# Patient Record
Sex: Female | Born: 1970 | Race: White | Hispanic: No | Marital: Married | Smoking: Never smoker
Health system: Southern US, Community
[De-identification: ages and names within clinical notes are randomized; demographics above are authoritative.]

## PROBLEM LIST (undated history)

## (undated) DIAGNOSIS — G43109 Migraine with aura, not intractable, without status migrainosus: Secondary | ICD-10-CM

## (undated) DIAGNOSIS — R519 Headache, unspecified: Secondary | ICD-10-CM

## (undated) DIAGNOSIS — F329 Major depressive disorder, single episode, unspecified: Secondary | ICD-10-CM

## (undated) DIAGNOSIS — N946 Dysmenorrhea, unspecified: Secondary | ICD-10-CM

## (undated) DIAGNOSIS — N6310 Unspecified lump in the right breast, unspecified quadrant: Secondary | ICD-10-CM

## (undated) DIAGNOSIS — R112 Nausea with vomiting, unspecified: Secondary | ICD-10-CM

## (undated) DIAGNOSIS — N921 Excessive and frequent menstruation with irregular cycle: Secondary | ICD-10-CM

## (undated) DIAGNOSIS — N943 Premenstrual tension syndrome: Secondary | ICD-10-CM

## (undated) DIAGNOSIS — Z9889 Other specified postprocedural states: Secondary | ICD-10-CM

## (undated) DIAGNOSIS — C801 Malignant (primary) neoplasm, unspecified: Secondary | ICD-10-CM

## (undated) DIAGNOSIS — F32A Depression, unspecified: Secondary | ICD-10-CM

## (undated) DIAGNOSIS — E88819 Insulin resistance, unspecified: Secondary | ICD-10-CM

## (undated) DIAGNOSIS — G4700 Insomnia, unspecified: Secondary | ICD-10-CM

## (undated) DIAGNOSIS — K219 Gastro-esophageal reflux disease without esophagitis: Secondary | ICD-10-CM

## (undated) DIAGNOSIS — R51 Headache: Secondary | ICD-10-CM

## (undated) DIAGNOSIS — E8881 Metabolic syndrome: Secondary | ICD-10-CM

## (undated) DIAGNOSIS — N926 Irregular menstruation, unspecified: Secondary | ICD-10-CM

## (undated) DIAGNOSIS — C9590 Leukemia, unspecified not having achieved remission: Secondary | ICD-10-CM

## (undated) HISTORY — PX: CHOLECYSTECTOMY: SHX55

## (undated) HISTORY — DX: Irregular menstruation, unspecified: N92.6

## (undated) HISTORY — DX: Metabolic syndrome: E88.81

## (undated) HISTORY — DX: Migraine with aura, not intractable, without status migrainosus: G43.109

## (undated) HISTORY — DX: Dysmenorrhea, unspecified: N94.6

## (undated) HISTORY — DX: Insomnia, unspecified: G47.00

## (undated) HISTORY — DX: Excessive and frequent menstruation with irregular cycle: N92.1

## (undated) HISTORY — DX: Unspecified lump in the right breast, unspecified quadrant: N63.10

## (undated) HISTORY — DX: Insulin resistance, unspecified: E88.819

---

## 2004-04-05 ENCOUNTER — Ambulatory Visit: Payer: Self-pay

## 2005-08-31 ENCOUNTER — Ambulatory Visit: Payer: Self-pay | Admitting: General Surgery

## 2011-02-06 ENCOUNTER — Emergency Department: Payer: Self-pay | Admitting: Emergency Medicine

## 2011-02-06 LAB — COMPREHENSIVE METABOLIC PANEL
Albumin: 4.2 g/dL (ref 3.4–5.0)
Alkaline Phosphatase: 55 U/L (ref 50–136)
Calcium, Total: 9 mg/dL (ref 8.5–10.1)
Chloride: 106 mmol/L (ref 98–107)
Co2: 23 mmol/L (ref 21–32)
Creatinine: 0.98 mg/dL (ref 0.60–1.30)
SGOT(AST): 22 U/L (ref 15–37)
SGPT (ALT): 31 U/L

## 2011-02-06 LAB — CBC
HCT: 38.1 % (ref 35.0–47.0)
HGB: 13.1 g/dL (ref 12.0–16.0)
MCH: 30.8 pg (ref 26.0–34.0)
Platelet: 189 10*3/uL (ref 150–440)
RBC: 4.24 10*6/uL (ref 3.80–5.20)
RDW: 13 % (ref 11.5–14.5)
WBC: 6.1 10*3/uL (ref 3.6–11.0)

## 2011-02-06 LAB — TROPONIN I: Troponin-I: <0.02 ng/mL

## 2011-02-14 ENCOUNTER — Ambulatory Visit: Payer: Self-pay | Admitting: Internal Medicine

## 2013-01-16 DIAGNOSIS — N6310 Unspecified lump in the right breast, unspecified quadrant: Secondary | ICD-10-CM

## 2013-01-16 HISTORY — DX: Unspecified lump in the right breast, unspecified quadrant: N63.10

## 2013-03-06 ENCOUNTER — Ambulatory Visit: Payer: Self-pay

## 2013-03-10 ENCOUNTER — Ambulatory Visit: Payer: Self-pay

## 2013-03-10 HISTORY — PX: BREAST FIBROADENOMA SURGERY: SHX580

## 2013-03-11 LAB — PATHOLOGY REPORT

## 2014-08-20 ENCOUNTER — Inpatient Hospital Stay: Admission: RE | Admit: 2014-08-20 | Payer: Self-pay | Source: Ambulatory Visit

## 2014-08-25 ENCOUNTER — Encounter: Payer: Self-pay | Admitting: *Deleted

## 2014-08-25 NOTE — Patient Instructions (Signed)
  Your procedure is scheduled on: 08-28-14 Report to [place] To find out your arrival time please call [phone] between 1PM - 3PM on 08-27-14  Remember: Instructions that are not followed completely may result in serious medical risk, up to and including death, or upon the discretion of your surgeon and anesthesiologist your surgery may need to be rescheduled.    _X___ 1. Do not eat food or drink liquids after midnight. No gum chewing or hard candies.     _X___ 2. No Alcohol for 24 hours before or after surgery.   ____ 3. Bring all medications with you on the day of surgery if instructed.    ____ 4. Notify your doctor if there is any change in your medical condition     (cold, fever, infections).     Do not wear jewelry, make-up, hairpins, clips or nail polish.  Do not wear lotions, powders, or perfumes. You may wear deodorant.  Do not shave 48 hours prior to surgery. Men may shave face and neck.  Do not bring valuables to the hospital.    [facility] is not responsible for any belongings or valuables.               Contacts, dentures or bridgework may not be worn into surgery.  Leave your suitcase in the car. After surgery it may be brought to your room.  For patients admitted to the hospital, discharge time is determined by your  treatment team.   Patients discharged the day of surgery will not be allowed to drive home.   Please read over the following fact sheets that you were given:     _X___ Take these medicines the morning of surgery with A SIP OF WATER:    1. VENLAFAXINE  2.   3.   4.  5.  6.  ____ Fleet Enema (as directed)   ____ Use CHG Soap as directed  ____ Use inhalers on the day of surgery  ____ Stop metformin 2 days prior to surgery    ____ Take 1/2 of usual insulin dose the night before surgery and none on the morning of surgery.   ____ Stop Coumadin/Plavix/aspirin-N/A  ____ Stop Anti-inflammatories-NO NSAIDS OR ASA  PRODUCTS-TYLENOL OK   ____ Stop supplements until after surgery.    ____ Bring C-Pap to the hospital.

## 2014-09-11 ENCOUNTER — Ambulatory Visit
Admission: RE | Admit: 2014-09-11 | Discharge: 2014-09-11 | Disposition: A | Discharge status: Home / Self Care | Payer: 59 | Source: Ambulatory Visit | Attending: Specialist | Admitting: Specialist

## 2014-09-11 ENCOUNTER — Ambulatory Visit: Payer: 59 | Admitting: Anesthesiology

## 2014-09-11 ENCOUNTER — Encounter
Admission: RE | Disposition: A | Discharge status: Home / Self Care | Payer: Self-pay | Source: Ambulatory Visit | Attending: Specialist

## 2014-09-11 DIAGNOSIS — F329 Major depressive disorder, single episode, unspecified: Secondary | ICD-10-CM | POA: Diagnosis not present

## 2014-09-11 DIAGNOSIS — K219 Gastro-esophageal reflux disease without esophagitis: Secondary | ICD-10-CM | POA: Insufficient documentation

## 2014-09-11 DIAGNOSIS — Z79899 Other long term (current) drug therapy: Secondary | ICD-10-CM | POA: Insufficient documentation

## 2014-09-11 DIAGNOSIS — G5602 Carpal tunnel syndrome, left upper limb: Secondary | ICD-10-CM | POA: Diagnosis not present

## 2014-09-11 HISTORY — DX: Other specified postprocedural states: Z98.890

## 2014-09-11 HISTORY — PX: CARPAL TUNNEL RELEASE: SHX101

## 2014-09-11 HISTORY — DX: Gastro-esophageal reflux disease without esophagitis: K21.9

## 2014-09-11 HISTORY — DX: Nausea with vomiting, unspecified: R11.2

## 2014-09-11 LAB — POCT PREGNANCY, URINE: Preg Test, Ur: NEGATIVE

## 2014-09-11 SURGERY — CARPAL TUNNEL RELEASE
Anesthesia: General | Laterality: Left | Wound class: Clean

## 2014-09-11 MED ORDER — HYDROCODONE-ACETAMINOPHEN 5-325 MG PO TABS: 1.0000 | ORAL_TABLET | Freq: Four times a day (QID) | ORAL | Status: DC | PRN

## 2014-09-11 MED ORDER — MIDAZOLAM HCL 2 MG/2ML IJ SOLN
INTRAMUSCULAR | Status: DC | PRN
  Administered 2014-09-11: 2 mg via INTRAVENOUS

## 2014-09-11 MED ORDER — FAMOTIDINE 20 MG PO TABS
ORAL_TABLET | ORAL | Status: AC
  Filled 2014-09-11: qty 1

## 2014-09-11 MED ORDER — BUPIVACAINE HCL (PF) 0.5 % IJ SOLN
INTRAMUSCULAR | Status: AC
  Filled 2014-09-11: qty 30

## 2014-09-11 MED ORDER — DEXAMETHASONE SODIUM PHOSPHATE 4 MG/ML IJ SOLN
INTRAMUSCULAR | Status: DC | PRN
Start: 2014-09-11 — End: 2014-09-11
  Administered 2014-09-11: 10 mg via INTRAVENOUS

## 2014-09-11 MED ORDER — FENTANYL CITRATE (PF) 100 MCG/2ML IJ SOLN: 25.0000 ug | INTRAMUSCULAR | Status: DC | PRN

## 2014-09-11 MED ORDER — SODIUM CHLORIDE 0.9 % IJ SOLN
INTRAMUSCULAR | Status: AC
  Filled 2014-09-11: qty 6

## 2014-09-11 MED ORDER — FENTANYL CITRATE (PF) 100 MCG/2ML IJ SOLN
INTRAMUSCULAR | Status: DC | PRN
  Administered 2014-09-11: 100 ug via INTRAVENOUS

## 2014-09-11 MED ORDER — LACTATED RINGERS IV SOLN
INTRAVENOUS | Status: DC
  Administered 2014-09-11 (×2): via INTRAVENOUS

## 2014-09-11 MED ORDER — ONDANSETRON HCL 4 MG/2ML IJ SOLN: 4.0000 mg | Freq: Once | INTRAMUSCULAR | Status: DC | PRN

## 2014-09-11 MED ORDER — FAMOTIDINE 20 MG PO TABS
20.0000 mg | ORAL_TABLET | Freq: Once | ORAL | Status: AC
Start: 2014-09-11 — End: 2014-09-11
  Administered 2014-09-11: 20 mg via ORAL

## 2014-09-11 MED ORDER — CHLORHEXIDINE GLUCONATE 4 % EX LIQD: Freq: Once | CUTANEOUS | Status: DC

## 2014-09-11 MED ORDER — ONDANSETRON HCL 4 MG/2ML IJ SOLN
INTRAMUSCULAR | Status: DC | PRN
  Administered 2014-09-11: 4 mg via INTRAVENOUS

## 2014-09-11 MED ORDER — PROPOFOL 10 MG/ML IV BOLUS
INTRAVENOUS | Status: DC | PRN
  Administered 2014-09-11: 160 mg via INTRAVENOUS

## 2014-09-11 SURGICAL SUPPLY — 22 items
BANDAGE ELASTIC 3 CLIP NS LF (GAUZE/BANDAGES/DRESSINGS) ×3 IMPLANT
BNDG ESMARK 4X12 TAN STRL LF (GAUZE/BANDAGES/DRESSINGS) ×3 IMPLANT
CANISTER SUCT 1200ML W/VALVE (MISCELLANEOUS) ×3 IMPLANT
CAST PADDING 3X4FT ST 30246 (SOFTGOODS) ×4
CHLORAPREP W/TINT 26ML (MISCELLANEOUS) ×3 IMPLANT
GAUZE PETRO XEROFOAM 1X8 (MISCELLANEOUS) ×3 IMPLANT
GAUZE SPONGE 4X4 12PLY STRL (GAUZE/BANDAGES/DRESSINGS) ×3 IMPLANT
GLOVE BIO SURGEON STRL SZ7.5 (GLOVE) ×3 IMPLANT
GOWN STRL REUS W/ TWL LRG LVL3 (GOWN DISPOSABLE) ×2 IMPLANT
GOWN STRL REUS W/TWL LRG LVL3 (GOWN DISPOSABLE) ×4
KIT RM TURNOVER STRD PROC AR (KITS) ×3 IMPLANT
NS IRRIG 500ML POUR BTL (IV SOLUTION) ×3 IMPLANT
PACK EXTREMITY ARMC (MISCELLANEOUS) ×3 IMPLANT
PAD CAST CTTN 3X4 STRL (SOFTGOODS) ×2 IMPLANT
PAD GROUND ADULT SPLIT (MISCELLANEOUS) ×3 IMPLANT
PAD PREP 24X41 OB/GYN DISP (PERSONAL CARE ITEMS) ×3 IMPLANT
PADDING CAST 3IN STRL (MISCELLANEOUS) ×2
PADDING CAST BLEND 3X4 STRL (MISCELLANEOUS) ×1 IMPLANT
STOCKINETTE STRL 4IN 9604848 (GAUZE/BANDAGES/DRESSINGS) ×3 IMPLANT
SUT ETHILON 4-0 (SUTURE) ×2
SUT ETHILON 4-0 FS2 18XMFL BLK (SUTURE) ×1
SUTURE ETHLN 4-0 FS2 18XMF BLK (SUTURE) ×1 IMPLANT

## 2014-09-11 NOTE — Anesthesia Procedure Notes (Signed)
Procedure Name: LMA Insertion Date/Time: 09/11/2014 9:10 AM Performed by: Nelda Marseille Pre-anesthesia Checklist: Patient identified, Patient being monitored, Timeout performed, Emergency Drugs available and Suction available Patient Re-evaluated:Patient Re-evaluated prior to inductionOxygen Delivery Method: Circle system utilized Preoxygenation: Pre-oxygenation with 100% oxygen Intubation Type: IV induction Ventilation: Mask ventilation without difficulty LMA: LMA inserted LMA Size: 3.5 Tube type: Oral Number of attempts: 1 Placement Confirmation: positive ETCO2 and breath sounds checked- equal and bilateral Tube secured with: Tape Dental Injury: Teeth and Oropharynx as per pre-operative assessment

## 2014-09-11 NOTE — Transfer of Care (Signed)
Immediate Anesthesia Transfer of Care Note  Patient: Rhonda Kane  Procedure(s) Performed: Procedure(s): CARPAL TUNNEL RELEASE (Left)  Patient Location: PACU  Anesthesia Type:General  Level of Consciousness: awake, alert  and oriented  Airway & Oxygen Therapy: Patient Spontanous Breathing and Patient connected to nasal cannula oxygen  Post-op Assessment: Report given to RN and Post -op Vital signs reviewed and stable  Post vital signs: Reviewed and stable  Last Vitals:  Filed Vitals:   09/11/14 0813  BP: 125/86  Pulse: 88  Temp: 36.9 C  Resp: 16    Complications: No apparent anesthesia complications

## 2014-09-11 NOTE — Anesthesia Postprocedure Evaluation (Signed)
  Anesthesia Post-op Note  Patient: Rhonda Kane  Procedure(s) Performed: Procedure(s): CARPAL TUNNEL RELEASE (Left)  Anesthesia type:General  Patient location: PACU  Post pain: Pain level controlled  Post assessment: Post-op Vital signs reviewed, Patient's Cardiovascular Status Stable, Respiratory Function Stable, Patent Airway and No signs of Nausea or vomiting  Post vital signs: Reviewed and stable  Last Vitals:  Filed Vitals:   09/11/14 0945  BP: 130/83  Pulse: 78  Temp: 37 C  Resp: 20    Level of consciousness: awake, alert  and patient cooperative  Complications: No apparent anesthesia complications

## 2014-09-11 NOTE — H&P (Signed)
  44 year old female with left carpal tunnel syndrome.  History and physical exam has been inserted in the record in the form of a paper document.  Heart and lungs clear.  ENT normal.  Plan: left carpal tunnel release.

## 2014-09-11 NOTE — Discharge Instructions (Signed)
Elevate hand at all times May remove entire bandage tomorrow afternoon, cover wound with BandAid Wear brace as necessary after that

## 2014-09-11 NOTE — Anesthesia Preprocedure Evaluation (Signed)
Anesthesia Evaluation  Patient identified by MRN, date of birth, ID band Patient awake    Reviewed: Allergy & Precautions, NPO status   History of Anesthesia Complications (+) PONV  Airway Mallampati: II       Dental  (+) Upper Dentures   Pulmonary neg pulmonary ROS,    Pulmonary exam normal       Cardiovascular negative cardio ROS Normal cardiovascular exam    Neuro/Psych negative neurological ROS  negative psych ROS   GI/Hepatic Neg liver ROS, GERD-  ,  Endo/Other  negative endocrine ROS  Renal/GU negative Renal ROS  negative genitourinary   Musculoskeletal negative musculoskeletal ROS (+)   Abdominal Normal abdominal exam  (+)   Peds negative pediatric ROS (+)  Hematology negative hematology ROS (+)   Anesthesia Other Findings   Reproductive/Obstetrics negative OB ROS                             Anesthesia Physical Anesthesia Plan  ASA: II  Anesthesia Plan: General   Post-op Pain Management:    Induction: Intravenous  Airway Management Planned: LMA  Additional Equipment:   Intra-op Plan:   Post-operative Plan: Extubation in OR  Informed Consent: I have reviewed the patients History and Physical, chart, labs and discussed the procedure including the risks, benefits and alternatives for the proposed anesthesia with the patient or authorized representative who has indicated his/her understanding and acceptance.     Plan Discussed with: CRNA  Anesthesia Plan Comments:         Anesthesia Quick Evaluation

## 2014-09-11 NOTE — Brief Op Note (Signed)
09/11/2014  9:44 AM  PATIENT:  Rhonda Kane  44 y.o. female  PRE-OPERATIVE DIAGNOSIS:  CARPAL TUNNEL SYNDROME  POST-OPERATIVE DIAGNOSIS:  CARPAL TUNNEL SYNDROME  PROCEDURE:  Procedure(s): CARPAL TUNNEL RELEASE (Left)  SURGEON:  Surgeon(s) and Role:    * Christophe Louis, MD - Primary  PHYSICIAN ASSISTANT:   ASSISTANTS: none   ANESTHESIA:   general  EBL:   none  BLOOD ADMINISTERED:none  DRAINS: none   LOCAL MEDICATIONS USED:  MARCAINE     SPECIMEN:  No Specimen  DISPOSITION OF SPECIMEN:  N/A  COUNTS:  YES  TOURNIQUET:    DICTATION: .Other Dictation: Dictation Number [id number]  PLAN OF CARE: Discharge to home after PACU  PATIENT DISPOSITION:  PACU - hemodynamically stable.   Delay start of Pharmacological VTE agent (>24hrs) due to surgical blood loss or risk of bleeding: not applicable

## 2014-09-11 NOTE — Addendum Note (Signed)
Addendum  created 09/11/14 1044 by Nelda Marseille, CRNA   Modules edited: Anesthesia Flowsheet, Anesthesia Medication Administration, Charges VN

## 2014-09-12 NOTE — Op Note (Signed)
NAMEIMARI, REEN NO.:  [id number]  MEDICAL RECORD NO.:  [id number]  LOCATION:  [facility]                         FACILITY:  [facility]  PHYSICIAN:  Margaretmary Eddy, MD        DATE OF BIRTH:  12-Feb-1970  DATE OF PROCEDURE:  09/11/2014 DATE OF DISCHARGE:  09/11/2014                              OPERATIVE REPORT   PREOPERATIVE DIAGNOSIS:  Left carpal tunnel syndrome.  PROCEDURE:  Left carpal tunnel release.  SURGEON:  Margaretmary Eddy, MD  ANESTHESIA:  General.  COMPLICATIONS:  None.  TOURNIQUET TIME:  Approximately 15 minutes.  DESCRIPTION OF PROCEDURE:  After adequate induction of general anesthesia, the left upper extremity was thoroughly prepped with alcohol and ChloraPrep and draped in standard sterile fashion.  The extremity was wrapped out with the Esmarch bandage and pneumatic tourniquet elevated to 250 mmHg.  Under loupe magnification, standard volar carpal tunnel incision was made and the dissection carried down to the transverse retinacular ligament.  Longitudinal incision was made in the ligament with the knife.  The distal release was performed with the small scissors.  The proximal release was performed with the small scissors and the carpal tunnel scissors.  Careful check is made both proximally and distally to ensure that complete release had been obtained.  There does seem to be moderate compression of the nerve directly beneath the ligament.  The wound was thoroughly irrigated multiple times.  Skin edges were infiltrated with 0.5% plain Marcaine. Skin was closed with 4-0 nylon.  Soft bulky dressing was applied. Patient was returned to the recovery room in satisfactory condition having tolerated the procedure quite well.          ______________________________ Margaretmary Eddy, MD     CS/MEDQ  D:  09/11/2014  T:  09/12/2014  Job:  [id number]

## 2014-09-14 ENCOUNTER — Ambulatory Visit (INDEPENDENT_AMBULATORY_CARE_PROVIDER_SITE_OTHER): Payer: 59

## 2014-09-14 ENCOUNTER — Encounter: Payer: Self-pay | Admitting: Podiatry

## 2014-09-14 ENCOUNTER — Ambulatory Visit (INDEPENDENT_AMBULATORY_CARE_PROVIDER_SITE_OTHER): Payer: 59 | Admitting: Podiatry

## 2014-09-14 VITALS — BP 131/70 | HR 80 | Resp 16 | Ht 64.0 in | Wt 211.0 lb

## 2014-09-14 DIAGNOSIS — M722 Plantar fascial fibromatosis: Secondary | ICD-10-CM | POA: Diagnosis not present

## 2014-09-14 DIAGNOSIS — M79673 Pain in unspecified foot: Secondary | ICD-10-CM

## 2014-09-14 DIAGNOSIS — M7671 Peroneal tendinitis, right leg: Secondary | ICD-10-CM

## 2014-09-14 DIAGNOSIS — M779 Enthesopathy, unspecified: Secondary | ICD-10-CM

## 2014-09-14 MED ORDER — MELOXICAM 15 MG PO TABS
15.0000 mg | ORAL_TABLET | Freq: Every day | ORAL | Status: DC
Start: 2014-09-14 — End: 2018-02-01

## 2014-09-14 MED ORDER — METHYLPREDNISOLONE 4 MG PO TBPK: ORAL_TABLET | ORAL | Status: DC

## 2014-09-14 NOTE — Progress Notes (Signed)
   Subjective:    Patient ID: Rhonda Kane, female    DOB: 09-19-1970, 44 y.o.   MRN: [id number]  HPI: She presents today with chief complaint of pain to her lateral ankle writes times the past couple of years states that it has just gotten to the point where it really starts to bother and a regular basis over the past few weeks. She denies any trauma and she denies using anything other than a call on brace to help stabilize the ankle. She states that the pain radiates from lateral to proximal and up into the leg.    Review of Systems  Constitutional: Positive for fatigue.  HENT: Positive for hearing loss.   Cardiovascular: Positive for leg swelling.  Musculoskeletal: Positive for myalgias.  Neurological: Positive for headaches.  Psychiatric/Behavioral: The patient is nervous/anxious.   All other systems reviewed and are negative.      Objective:   Physical Exam: I have reviewed her past medical history medications allergy surgery social history and review of systems. 44 year old white female in no acute distress with vital signs stable alert and oriented 3 presents strong palpable pulses normal neurologic sensorium per Semmes-Weinstein monofilament. Deep tendon reflexes are brisk and equal bilateral Achilles. Muscle strength +5 over 5 dorsiflexion plantar flexors and inverters and everters all into the musculature is intact. She does have some tenderness on eversion against resistance. She also has some tenderness on plantar flexion and eversion. Orthopedic evaluation demonstrates pain on palpation of the peroneal tendons as they course from beneath the medial lateral malleolus extending to the base of the fifth metatarsal right. She also has pain on palpation medial calcaneal tubercle of the right foot. 3 radiographs of the right foot and ankle today demonstrate soft tissue swelling along the lateral ankle no fractures noted. She does have pain on palpation of the medial calcaneal tubercle.  Which is demonstrated by a soft tissue increase in density at the plantar fascial calcaneal insertion site on radiograph. There is also fluctuance that extends proximally from the lateral malleolus. Cutaneous evaluation demonstrates supple well-hydrated cutis no erythema edema saline as drainage or odor.        Assessment & Plan:  Assessment: Peroneal tendinitis right foot. Plantar fasciitis right foot.  Plan: We discussed the etiology pathology conservative versus surgical therapies. At this point I injected the right heel today with Kenalog and local and aesthetic. Started her on a Medrol Dosepak to be followed by meloxicam. We placed her in a ankle stabilizer for the peroneal tendon instability and then provided her with a night splint as well. We discussed appropriate shoe gear stretching exercises ice therapy and sugar modifications. We will follow up with her in 1 month.

## 2014-09-14 NOTE — Patient Instructions (Signed)
Plantar Fasciitis  Plantar fasciitis is a common condition that causes foot pain. It is soreness (inflammation) of the band of tough fibrous tissue on the bottom of the foot that runs from the heel bone (calcaneus) to the ball of the foot. The cause of this soreness may be from excessive standing, poor fitting shoes, running on hard surfaces, being overweight, having an abnormal walk, or overuse (this is common in runners) of the painful foot or feet. It is also common in aerobic exercise dancers and ballet dancers.  SYMPTOMS   Most people with plantar fasciitis complain of:   Severe pain in the morning on the bottom of their foot especially when taking the first steps out of bed. This pain recedes after a few minutes of walking.   Severe pain is experienced also during walking following a long period of inactivity.   Pain is worse when walking barefoot or up stairs  DIAGNOSIS    Your caregiver will diagnose this condition by examining and feeling your foot.   Special tests such as X-rays of your foot, are usually not needed.  PREVENTION    Consult a sports medicine professional before beginning a new exercise program.   Walking programs offer a good workout. With walking there is a lower chance of overuse injuries common to runners. There is less impact and less jarring of the joints.   Begin all new exercise programs slowly. If problems or pain develop, decrease the amount of time or distance until you are at a comfortable level.   Wear good shoes and replace them regularly.   Stretch your foot and the heel cords at the back of the ankle (Achilles tendon) both before and after exercise.   Run or exercise on even surfaces that are not hard. For example, asphalt is better than pavement.   Do not run barefoot on hard surfaces.   If using a treadmill, vary the incline.   Do not continue to workout if you have foot or joint problems. Seek professional help if they do not improve.  HOME CARE INSTRUCTIONS     Avoid activities that cause you pain until you recover.   Use ice or cold packs on the problem or painful areas after working out.   Only take over-the-counter or prescription medicines for pain, discomfort, or fever as directed by your caregiver.   Soft shoe inserts or athletic shoes with air or gel sole cushions may be helpful.   If problems continue or become more severe, consult a sports medicine caregiver or your own health care provider. Cortisone is a potent anti-inflammatory medication that may be injected into the painful area. You can discuss this treatment with your caregiver.  MAKE SURE YOU:    Understand these instructions.   Will watch your condition.   Will get help right away if you are not doing well or get worse.  Document Released: 09/27/2000 Document Revised: 03/27/2011 Document Reviewed: 11/27/2007  Exit[employer] Patient Information 2015 [employer]. This information is not intended to replace advice given to you by your health care provider. Make sure you discuss any questions you have with your health care provider.

## 2014-09-18 NOTE — Addendum Note (Signed)
Addendum  created 09/18/14 2103 by Iver Nestle, MD   Modules edited: Anesthesia Responsible Staff

## 2014-10-06 ENCOUNTER — Encounter: Payer: Self-pay | Admitting: Podiatry

## 2014-10-06 ENCOUNTER — Encounter: Payer: Self-pay | Admitting: *Deleted

## 2014-10-06 NOTE — Telephone Encounter (Signed)
Note faxed to pt as requested [phone].

## 2014-10-12 ENCOUNTER — Ambulatory Visit (INDEPENDENT_AMBULATORY_CARE_PROVIDER_SITE_OTHER): Payer: 59 | Admitting: Podiatry

## 2014-10-12 ENCOUNTER — Encounter: Payer: Self-pay | Admitting: Podiatry

## 2014-10-12 VITALS — BP 115/74 | HR 82 | Resp 12

## 2014-10-12 DIAGNOSIS — S92101D Unspecified fracture of right talus, subsequent encounter for fracture with routine healing: Secondary | ICD-10-CM | POA: Diagnosis not present

## 2014-10-12 DIAGNOSIS — M7671 Peroneal tendinitis, right leg: Secondary | ICD-10-CM

## 2014-10-12 NOTE — Progress Notes (Signed)
She presents today for follow-up of her peroneal tendon pain. She states that it really hasn't gotten any better it is still very sore down deep in the back of these tendons and she points to the posterior aspect of her foot.  Objective: Vital signs are stable she is alert and oriented 3. Strong palpable pulses with mild tenderness on palpation of the medial calcaneal tubercle of the right heel. She has pain on palpation of the deep posterior ankle and subtalar joint. She also has pain on dorsiflexion of the first metatarsophalangeal joint. I reevaluated previous radiographs which do appear to demonstrate a possible stress fracture of the posterior process of the talus.  Assessment: Fracture posterior process of the talus left plantar fasciitis left.  Plan: I placed her in a Cam Walker at this point. I will follow-up with her in 3-4 weeks to assess her improvement. Should she fail to improve then I would suggest an MRI of this area.

## 2014-10-14 ENCOUNTER — Encounter: Payer: Self-pay | Admitting: *Deleted

## 2014-10-14 ENCOUNTER — Telehealth: Payer: Self-pay | Admitting: *Deleted

## 2014-10-14 NOTE — Progress Notes (Signed)
Completed FMLA - faxed and mailed original to pt.

## 2014-10-14 NOTE — Telephone Encounter (Signed)
Error

## 2014-10-21 ENCOUNTER — Telehealth: Payer: Self-pay | Admitting: *Deleted

## 2014-10-21 ENCOUNTER — Encounter: Payer: Self-pay | Admitting: Podiatry

## 2014-10-21 NOTE — Telephone Encounter (Signed)
Entered in error

## 2014-10-21 NOTE — Telephone Encounter (Signed)
Pt states she does a lot of walking at work and that may be why her foot still hurts.  I offered an appt to discuss with Dr. Milinda Pointer.  Transferred pt to schedulers.

## 2014-10-27 ENCOUNTER — Telehealth: Payer: Self-pay | Admitting: *Deleted

## 2014-10-27 NOTE — Telephone Encounter (Signed)
Thayer Headings states the previous paperwork had been received, but she needs a note for start date and end date of when pt is to begin wearing the pneumatic boot on the right foot and the athletic shoe on the left foot at work.

## 2014-10-28 ENCOUNTER — Ambulatory Visit (INDEPENDENT_AMBULATORY_CARE_PROVIDER_SITE_OTHER): Payer: 59 | Admitting: Podiatry

## 2014-10-28 ENCOUNTER — Encounter: Payer: Self-pay | Admitting: Podiatry

## 2014-10-28 ENCOUNTER — Telehealth: Payer: Self-pay | Admitting: *Deleted

## 2014-10-28 VITALS — BP 116/52 | HR 70 | Resp 16

## 2014-10-28 DIAGNOSIS — S92101D Unspecified fracture of right talus, subsequent encounter for fracture with routine healing: Secondary | ICD-10-CM

## 2014-10-28 DIAGNOSIS — S93401D Sprain of unspecified ligament of right ankle, subsequent encounter: Secondary | ICD-10-CM

## 2014-10-28 DIAGNOSIS — M7671 Peroneal tendinitis, right leg: Secondary | ICD-10-CM

## 2014-10-28 NOTE — Telephone Encounter (Addendum)
Dr. Milinda Pointer ordered MRI right ankle without contrast 73721, dx M76.71 peroneal tendonitis vs tear.  Faxed to [place].  [facility] [phone], due to [employer] and pt's plan approved, expires 12/12/2014.

## 2014-10-28 NOTE — Progress Notes (Signed)
She presents today for follow-up of her nail tendinitis and pain to her anterolateral ankle. She states that recently she heard a pop and felt a sharp pain in the ankle while she was wearing the Cam Walker. She states that now it just hurts all the time.  Objective: Vital signs are stable she is alert and oriented 3. Pulses are palpable. She has pain on palpation. Tendons as well as abduction against resistance. She also has pain on palpation of the distal fibula as well as the sinus tarsi and the anterior talofibular ligament.  Assessment: Probable tear of the peroneal tendons right ankle as well as the anterior talofibular ligament.  Plan: All conservative therapies have failed including steroids injections and immobilization. I am requesting an MRI of the right ankle without contrast.  Roselind Messier DPM

## 2014-11-02 ENCOUNTER — Ambulatory Visit: Payer: 59 | Admitting: Podiatry

## 2014-11-04 ENCOUNTER — Ambulatory Visit
Admission: RE | Admit: 2014-11-04 | Discharge: 2014-11-04 | Disposition: A | Discharge status: Home / Self Care | Payer: 59 | Source: Ambulatory Visit | Attending: Podiatry | Admitting: Podiatry

## 2014-11-04 DIAGNOSIS — M7671 Peroneal tendinitis, right leg: Secondary | ICD-10-CM | POA: Insufficient documentation

## 2014-11-04 DIAGNOSIS — M19071 Primary osteoarthritis, right ankle and foot: Secondary | ICD-10-CM | POA: Diagnosis not present

## 2014-11-04 DIAGNOSIS — S93401D Sprain of unspecified ligament of right ankle, subsequent encounter: Secondary | ICD-10-CM

## 2014-11-04 DIAGNOSIS — S92101D Unspecified fracture of right talus, subsequent encounter for fracture with routine healing: Secondary | ICD-10-CM

## 2014-11-06 ENCOUNTER — Telehealth: Payer: Self-pay

## 2014-11-06 NOTE — Telephone Encounter (Signed)
Called pt and left message informing her that Dr Milinda Pointer received her MRI results of right ankle and he wanted to send off to [facility] Over-read for review. Faxed over request to [facility] for copy of MRI cd and written report.

## 2014-11-09 ENCOUNTER — Telehealth: Payer: Self-pay | Admitting: Podiatry

## 2014-11-09 NOTE — Telephone Encounter (Signed)
Pt returned your call and I told her per nurse that mri was sent to overread per Dr Stephenie Acres req and we will call when results are back and schedule an appt.

## 2014-11-12 ENCOUNTER — Telehealth: Payer: Self-pay | Admitting: *Deleted

## 2014-11-12 NOTE — Telephone Encounter (Signed)
Mailed MRI right ankle disc copy.

## 2014-11-18 ENCOUNTER — Encounter: Payer: Self-pay | Admitting: *Deleted

## 2014-11-20 ENCOUNTER — Telehealth: Payer: Self-pay | Admitting: *Deleted

## 2014-11-20 NOTE — Telephone Encounter (Signed)
Pt request results of MRI from 11/04/2014.  Informed pt the OverRead results were not back, and we would call once reviewed.

## 2014-11-24 ENCOUNTER — Encounter: Payer: Self-pay | Admitting: Podiatry

## 2014-12-02 ENCOUNTER — Ambulatory Visit (INDEPENDENT_AMBULATORY_CARE_PROVIDER_SITE_OTHER): Payer: 59 | Admitting: Podiatry

## 2014-12-02 ENCOUNTER — Encounter: Payer: Self-pay | Admitting: Podiatry

## 2014-12-02 VITALS — BP 110/63 | HR 81 | Resp 18

## 2014-12-02 DIAGNOSIS — M7751 Other enthesopathy of right foot: Secondary | ICD-10-CM

## 2014-12-02 NOTE — Progress Notes (Signed)
She presents today for follow-up of pain to her anterior lateral ankle. She states it still hurts just as bad as it ever did. And her MRI results are in.  Objective: Vital signs are stable she is alert and oriented 3. 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  Plan: I injected the area after sterile Betadine skin prep right into the lateral gutter today 20 mg was utilized. Discussed appropriate oral anti-inflammatory therapy. Also discussed the possible need for ankle arthroscopy. Follow up with me in 6 weeks.

## 2014-12-03 ENCOUNTER — Encounter: Payer: Self-pay | Admitting: Podiatry

## 2014-12-16 ENCOUNTER — Ambulatory Visit (INDEPENDENT_AMBULATORY_CARE_PROVIDER_SITE_OTHER): Payer: 59 | Admitting: Podiatry

## 2014-12-16 ENCOUNTER — Encounter: Payer: Self-pay | Admitting: Podiatry

## 2014-12-16 VITALS — BP 119/70 | HR 74 | Resp 16

## 2014-12-16 DIAGNOSIS — S93401D Sprain of unspecified ligament of right ankle, subsequent encounter: Secondary | ICD-10-CM | POA: Diagnosis not present

## 2014-12-16 DIAGNOSIS — M7751 Other enthesopathy of right foot: Secondary | ICD-10-CM

## 2014-12-16 NOTE — Progress Notes (Signed)
She presents today for follow-up of her right ankle pain. The last time she was in we discussed her MRI results which were positive for chondromalacia fibular malleolar side of the lateral gutter. I injected her lateral gutter the last time she was in and she currently states that it feels so much better however she knows they will come back and would like to consider arthroscopic ankle surgery.  Objective: Vital signs are stable she is alert and oriented 3. Pulses remain palpable. She has much decrease in pain on palpation and range of motion along the fibular gutter right ankle. He she still has mild tenderness on palpation of the fibula itself.  Assessment: Capsulitis with on osteochondral defect lateral malleolus right foot.  Plan: I discussed arthroscopic ankle surgery with her today and recommended Dr. Celesta Gentile. She will follow up with him next week for consult.  Rhonda Kane DPM

## 2014-12-22 ENCOUNTER — Institutional Professional Consult (permissible substitution) (INDEPENDENT_AMBULATORY_CARE_PROVIDER_SITE_OTHER): Payer: 59 | Admitting: Podiatry

## 2014-12-22 DIAGNOSIS — M19071 Primary osteoarthritis, right ankle and foot: Secondary | ICD-10-CM

## 2014-12-22 NOTE — Patient Instructions (Signed)

## 2014-12-23 NOTE — Progress Notes (Signed)
    Subjective: 44 year old female presents the office they for surgical consultation of right ankle pain. She states that she has had pain to her right ankle the last couple years has been progressive. She states that she does have pain was on the outside portion of her ankle joint musculature in the move her ankle with walking or ambulation. She states that she presented steroid injection to her ankle which she had great relief after this. She also had an MRI. She previously was seen Dr. Milinda Pointer who discussed arthroscopic ankle surgery and should proceed with this at best time given her ongoing pain despite conservative treatment.  Objective: AAO 3, NAD DP/PT pulses 2/4, CRT less than 3 seconds Protective sensation intact with Simms Weinstein monofilament At this time there is tenderness palpation on the anterolateral aspect of the right ankle joint. There is no pain with ankle joint range of motion is no restriction. There is no area pinpoint bony tenderness and pain vibratory sensation. There is no overlying edema, erythema, increase in warmth. There is mild to palpation along the flexor tendons of the right ankle on the medial ankle however this subject was started after her ankle pain is she tries to walk differently. There is no edema, erythema, increase in warmth. No open lesions or pre-ulcerative lesions. There is no pain with calf compression, swelling, warmth, erythema.  Assessment: Patient presents with osteochondral defect lateral malleolus right ankle continuation of ankle pain  Plan: -Treatment options discussed including all alternatives, risks, and complications. Discussed both conservative and surgical treatment options. -MRI results were discussed with the patient. -At this time she elects to proceed with surgical intervention. Discussed with her arthroscopic ankle debridement versus open arthrotomy. This was coned a surgical center. She wishes to proceed -The incision placement as  well as the postoperative course was discussed with the patient. I discussed risks of the surgery which include, but not limited to, infection, bleeding, pain, swelling, need for further surgery, delayed or nonhealing, painful or ugly scar, numbness or sensation changes, over/under correction, recurrence, fracture, compartment syndrome, transfer lesions, further deformity, hardware failure, DVT/PE, loss of toe/foot. Patient understands these risks and wishes to proceed with surgery. The surgical consent was reviewed with the patient all 3 pages were signed. No promises or guarantees were given to the outcome of the procedure. All questions were answered to the best of my ability. Before the surgery the patient was encouraged to call the office if there is any further questions. The surgery will be performed at [facility] on an outpatient basis.  IMPRESSION: 1. Slight inflammation of the extensor digitorum longus tendon at the level of the ankle joint. 2. Arthritis involving the articular surface of the lateral malleolus. 3. Inflammation in the sinus tarsi. 4. Normal peroneal tendons

## 2014-12-29 ENCOUNTER — Telehealth: Payer: Self-pay | Admitting: *Deleted

## 2014-12-29 NOTE — Telephone Encounter (Signed)
Dr. Jacqualyn Posey wanted me to call you to set up your surgery.  Would you like to do so?  "Yes, what's the earliest that you have available?"  He can do it on December 28th.  It will be done at [facility].  "What time will it be?"  It will be sometime in the afternoon.  You will have to have a physical done prior to surgery.  Did you get the forms?  "No, I didn't get any forms."  I can fax them to you if you like.  "You know, let's just forget about it right now.  My foot is actually doing okay right now.  If it starts to bother me more I will call and get it scheduled in the New Year."  Okay, I'll let him know.

## 2014-12-30 ENCOUNTER — Ambulatory Visit: Payer: 59 | Admitting: Podiatry

## 2015-04-22 MED ORDER — ESTROGENS, CONJUGATED 0.625 MG/GM VA CREA
TOPICAL_CREAM | VAGINAL | Status: AC
  Filled 2015-04-22: qty 30

## 2015-04-29 ENCOUNTER — Encounter: Payer: Self-pay | Admitting: *Deleted

## 2015-04-29 ENCOUNTER — Other Ambulatory Visit: Payer: 59

## 2015-04-30 ENCOUNTER — Encounter: Payer: Self-pay | Admitting: *Deleted

## 2015-04-30 DIAGNOSIS — M21961 Unspecified acquired deformity of right lower leg: Secondary | ICD-10-CM | POA: Diagnosis present

## 2015-04-30 DIAGNOSIS — M76821 Posterior tibial tendinitis, right leg: Secondary | ICD-10-CM | POA: Diagnosis not present

## 2015-04-30 DIAGNOSIS — Q899 Congenital malformation, unspecified: Secondary | ICD-10-CM | POA: Diagnosis not present

## 2015-04-30 DIAGNOSIS — N943 Premenstrual tension syndrome: Secondary | ICD-10-CM | POA: Diagnosis not present

## 2015-04-30 DIAGNOSIS — Z793 Long term (current) use of hormonal contraceptives: Secondary | ICD-10-CM | POA: Diagnosis not present

## 2015-04-30 DIAGNOSIS — Z791 Long term (current) use of non-steroidal anti-inflammatories (NSAID): Secondary | ICD-10-CM | POA: Diagnosis not present

## 2015-04-30 DIAGNOSIS — Z79899 Other long term (current) drug therapy: Secondary | ICD-10-CM | POA: Diagnosis not present

## 2015-04-30 DIAGNOSIS — G43909 Migraine, unspecified, not intractable, without status migrainosus: Secondary | ICD-10-CM | POA: Diagnosis not present

## 2015-04-30 DIAGNOSIS — Z9049 Acquired absence of other specified parts of digestive tract: Secondary | ICD-10-CM | POA: Diagnosis not present

## 2015-04-30 DIAGNOSIS — F329 Major depressive disorder, single episode, unspecified: Secondary | ICD-10-CM | POA: Diagnosis not present

## 2015-04-30 NOTE — Patient Instructions (Signed)
  Your procedure is scheduled on:05/07/15 Report to Day Surgery. To find out your arrival time please call [phone] between 1PM - 3PM on 05/06/15.  Remember: Instructions that are not followed completely may result in serious medical risk, up to and including death, or upon the discretion of your surgeon and anesthesiologist your surgery may need to be rescheduled.    __x__ 1. Do not eat food or drink liquids after midnight. No gum chewing or hard candies.     __x__ 2. No Alcohol for 24 hours before or after surgery.   ____ 3. Bring all medications with you on the day of surgery if instructed.    __x__ 4. Notify your doctor if there is any change in your medical condition     (cold, fever, infections).     Do not wear jewelry, make-up, hairpins, clips or nail polish.  Do not wear lotions, powders, or perfumes. You may wear deodorant.  Do not shave 48 hours prior to surgery. Men may shave face and neck.  Do not bring valuables to the hospital.    [facility] is not responsible for any belongings or valuables.               Contacts, dentures or bridgework may not be worn into surgery.  Leave your suitcase in the car. After surgery it may be brought to your room.  For patients admitted to the hospital, discharge time is determined by your                treatment team.   Patients discharged the day of surgery will not be allowed to drive home.   Please read over the following fact sheets that you were given:   Surgical Site Infection Prevention  ____ Take these medicines the morning of surgery with A SIP OF WATER:    1. effexor  2.   3.   4.  5.  6.  ____ Fleet Enema (as directed)   _x___ Use CHG Soap as directed  ____ Use inhalers on the day of surgery  ____ Stop metformin 2 days prior to surgery    ____ Take 1/2 of usual insulin dose the night before surgery and none on the morning of surgery.   ____ Stop Coumadin/Plavix/aspirin on   __x__ Stop  Anti-inflammatories on today tylenol   ____ Stop supplements until after surgery.    ____ Bring C-Pap to the hospital.

## 2015-05-07 ENCOUNTER — Ambulatory Visit: Payer: 59 | Admitting: Anesthesiology

## 2015-05-07 ENCOUNTER — Ambulatory Visit
Admission: RE | Admit: 2015-05-07 | Discharge: 2015-05-07 | Disposition: A | Discharge status: Home / Self Care | Payer: 59 | Source: Ambulatory Visit | Attending: Podiatry | Admitting: Podiatry

## 2015-05-07 ENCOUNTER — Encounter: Payer: Self-pay | Admitting: *Deleted

## 2015-05-07 ENCOUNTER — Encounter
Admission: RE | Disposition: A | Discharge status: Home / Self Care | Payer: Self-pay | Source: Ambulatory Visit | Attending: Podiatry

## 2015-05-07 DIAGNOSIS — Z791 Long term (current) use of non-steroidal anti-inflammatories (NSAID): Secondary | ICD-10-CM | POA: Insufficient documentation

## 2015-05-07 DIAGNOSIS — Q899 Congenital malformation, unspecified: Secondary | ICD-10-CM | POA: Insufficient documentation

## 2015-05-07 DIAGNOSIS — T148XXA Other injury of unspecified body region, initial encounter: Secondary | ICD-10-CM

## 2015-05-07 DIAGNOSIS — Z793 Long term (current) use of hormonal contraceptives: Secondary | ICD-10-CM | POA: Insufficient documentation

## 2015-05-07 DIAGNOSIS — Z9049 Acquired absence of other specified parts of digestive tract: Secondary | ICD-10-CM | POA: Insufficient documentation

## 2015-05-07 DIAGNOSIS — M76821 Posterior tibial tendinitis, right leg: Secondary | ICD-10-CM | POA: Insufficient documentation

## 2015-05-07 DIAGNOSIS — F329 Major depressive disorder, single episode, unspecified: Secondary | ICD-10-CM | POA: Insufficient documentation

## 2015-05-07 DIAGNOSIS — M21961 Unspecified acquired deformity of right lower leg: Secondary | ICD-10-CM | POA: Insufficient documentation

## 2015-05-07 DIAGNOSIS — N943 Premenstrual tension syndrome: Secondary | ICD-10-CM | POA: Insufficient documentation

## 2015-05-07 DIAGNOSIS — G43909 Migraine, unspecified, not intractable, without status migrainosus: Secondary | ICD-10-CM | POA: Insufficient documentation

## 2015-05-07 DIAGNOSIS — Z79899 Other long term (current) drug therapy: Secondary | ICD-10-CM | POA: Insufficient documentation

## 2015-05-07 HISTORY — DX: Premenstrual tension syndrome: N94.3

## 2015-05-07 HISTORY — DX: Headache, unspecified: R51.9

## 2015-05-07 HISTORY — DX: Headache: R51

## 2015-05-07 HISTORY — DX: Major depressive disorder, single episode, unspecified: F32.9

## 2015-05-07 HISTORY — PX: ANKLE ARTHROSCOPY: SHX545

## 2015-05-07 HISTORY — DX: Depression, unspecified: F32.A

## 2015-05-07 LAB — POCT PREGNANCY, URINE: Preg Test, Ur: NEGATIVE

## 2015-05-07 SURGERY — ARTHROSCOPY, ANKLE
Anesthesia: General | Laterality: Right | Wound class: Clean

## 2015-05-07 MED ORDER — BUPIVACAINE HCL 0.25 % IJ SOLN
INTRAMUSCULAR | Status: DC | PRN
  Administered 2015-05-07: 16 mL

## 2015-05-07 MED ORDER — OXYCODONE-ACETAMINOPHEN 5-325 MG PO TABS: 1.0000 | ORAL_TABLET | ORAL | Status: DC | PRN

## 2015-05-07 MED ORDER — CEFAZOLIN SODIUM-DEXTROSE 2-4 GM/100ML-% IV SOLN
2.0000 g | Freq: Once | INTRAVENOUS | Status: AC
  Administered 2015-05-07: 2 g via INTRAVENOUS

## 2015-05-07 MED ORDER — FENTANYL CITRATE (PF) 100 MCG/2ML IJ SOLN: 25.0000 ug | INTRAMUSCULAR | Status: DC | PRN

## 2015-05-07 MED ORDER — ONDANSETRON HCL 4 MG PO TABS: 4.0000 mg | ORAL_TABLET | Freq: Four times a day (QID) | ORAL | Status: DC | PRN

## 2015-05-07 MED ORDER — LACTATED RINGERS IV SOLN
INTRAVENOUS | Status: DC
  Administered 2015-05-07: 06:00:00 via INTRAVENOUS

## 2015-05-07 MED ORDER — FAMOTIDINE 20 MG PO TABS
ORAL_TABLET | ORAL | Status: AC
  Filled 2015-05-07: qty 1

## 2015-05-07 MED ORDER — PROPOFOL 10 MG/ML IV BOLUS
INTRAVENOUS | Status: DC | PRN
  Administered 2015-05-07: 150 mg via INTRAVENOUS

## 2015-05-07 MED ORDER — ROPIVACAINE HCL 5 MG/ML IJ SOLN
INTRAMUSCULAR | Status: AC
  Filled 2015-05-07: qty 40

## 2015-05-07 MED ORDER — BUPIVACAINE HCL (PF) 0.25 % IJ SOLN
INTRAMUSCULAR | Status: AC
  Filled 2015-05-07: qty 30

## 2015-05-07 MED ORDER — EPHEDRINE SULFATE 50 MG/ML IJ SOLN
INTRAMUSCULAR | Status: DC | PRN
  Administered 2015-05-07: 10 mg via INTRAVENOUS

## 2015-05-07 MED ORDER — SCOPOLAMINE 1 MG/3DAYS TD PT72
1.0000 | MEDICATED_PATCH | Freq: Once | TRANSDERMAL | Status: DC
  Administered 2015-05-07: 1.5 mg via TRANSDERMAL

## 2015-05-07 MED ORDER — FENTANYL CITRATE (PF) 100 MCG/2ML IJ SOLN
INTRAMUSCULAR | Status: DC | PRN
  Administered 2015-05-07 (×2): 100 ug via INTRAVENOUS
  Administered 2015-05-07: 50 ug via INTRAVENOUS

## 2015-05-07 MED ORDER — FAMOTIDINE 20 MG PO TABS
20.0000 mg | ORAL_TABLET | Freq: Once | ORAL | Status: AC
  Administered 2015-05-07: 20 mg via ORAL

## 2015-05-07 MED ORDER — BUPIVACAINE HCL (PF) 0.5 % IJ SOLN
INTRAMUSCULAR | Status: AC
  Filled 2015-05-07: qty 30

## 2015-05-07 MED ORDER — SUCCINYLCHOLINE CHLORIDE 20 MG/ML IJ SOLN
INTRAMUSCULAR | Status: DC | PRN
  Administered 2015-05-07: 100 mg via INTRAVENOUS

## 2015-05-07 MED ORDER — ONDANSETRON HCL 4 MG/2ML IJ SOLN: 4.0000 mg | Freq: Once | INTRAMUSCULAR | Status: DC | PRN

## 2015-05-07 MED ORDER — ROCURONIUM BROMIDE 100 MG/10ML IV SOLN
INTRAVENOUS | Status: DC | PRN
  Administered 2015-05-07: 10 mg via INTRAVENOUS

## 2015-05-07 MED ORDER — LIDOCAINE HCL (PF) 1 % IJ SOLN
INTRAMUSCULAR | Status: AC
  Filled 2015-05-07: qty 5

## 2015-05-07 MED ORDER — ONDANSETRON HCL 4 MG/2ML IJ SOLN: 4.0000 mg | Freq: Four times a day (QID) | INTRAMUSCULAR | Status: DC | PRN

## 2015-05-07 MED ORDER — LIDOCAINE-EPINEPHRINE (PF) 1 %-1:200000 IJ SOLN
INTRAMUSCULAR | Status: AC
  Filled 2015-05-07: qty 30

## 2015-05-07 MED ORDER — HYDROMORPHONE HCL 1 MG/ML IJ SOLN: 0.2500 mg | INTRAMUSCULAR | Status: DC | PRN

## 2015-05-07 MED ORDER — BUPIVACAINE-EPINEPHRINE (PF) 0.5% -1:200000 IJ SOLN
INTRAMUSCULAR | Status: AC
  Filled 2015-05-07: qty 30

## 2015-05-07 MED ORDER — CEFAZOLIN SODIUM-DEXTROSE 2-4 GM/100ML-% IV SOLN
INTRAVENOUS | Status: AC
  Filled 2015-05-07: qty 100

## 2015-05-07 MED ORDER — SCOPOLAMINE 1 MG/3DAYS TD PT72
MEDICATED_PATCH | TRANSDERMAL | Status: AC
  Filled 2015-05-07: qty 1

## 2015-05-07 MED ORDER — DEXAMETHASONE SODIUM PHOSPHATE 10 MG/ML IJ SOLN
INTRAMUSCULAR | Status: DC | PRN
  Administered 2015-05-07: 10 mg via INTRAVENOUS

## 2015-05-07 MED ORDER — MIDAZOLAM HCL 2 MG/2ML IJ SOLN
INTRAMUSCULAR | Status: DC | PRN
  Administered 2015-05-07: 2 mg via INTRAVENOUS

## 2015-05-07 MED ORDER — LIDOCAINE-EPINEPHRINE (PF) 1 %-1:200000 IJ SOLN
INTRAMUSCULAR | Status: DC | PRN
  Administered 2015-05-07: 14 mL

## 2015-05-07 MED ORDER — EPINEPHRINE HCL 1 MG/ML IJ SOLN
INTRAMUSCULAR | Status: AC
  Filled 2015-05-07: qty 1

## 2015-05-07 MED ORDER — ONDANSETRON HCL 4 MG/2ML IJ SOLN
INTRAMUSCULAR | Status: DC | PRN
  Administered 2015-05-07: 4 mg via INTRAVENOUS

## 2015-05-07 SURGICAL SUPPLY — 52 items
ARTHROWAND PARAGON T2 (SURGICAL WAND) ×2
BAG COUNTER SPONGE EZ (MISCELLANEOUS) ×2 IMPLANT
BANDAGE ACE 3X5.8 VEL STRL LF (GAUZE/BANDAGES/DRESSINGS) ×2 IMPLANT
BANDAGE ELASTIC 4 LF NS (GAUZE/BANDAGES/DRESSINGS) IMPLANT
BANDAGE STRETCH 3X4.1 STRL (GAUZE/BANDAGES/DRESSINGS) ×2 IMPLANT
BLADE FULL RADIUS 2.9 (BLADE) ×2 IMPLANT
BLADE SHAVER AGGRES 3.5 (CUTTER) ×2 IMPLANT
BLADE SURG 15 STRL LF DISP TIS (BLADE) ×2 IMPLANT
BLADE SURG 15 STRL SS (BLADE) ×2
BNDG COHESIVE 4X5 TAN STRL (GAUZE/BANDAGES/DRESSINGS) ×2 IMPLANT
BNDG ESMARK 4X12 TAN STRL LF (GAUZE/BANDAGES/DRESSINGS) ×2 IMPLANT
BNDG GAUZE 4.5X4.1 6PLY STRL (MISCELLANEOUS) ×2 IMPLANT
BUR AGGRESSIVE+ 2.5 (BURR) IMPLANT
DURAPREP 26ML APPLICATOR (WOUND CARE) ×2 IMPLANT
ETHIBOND 2 0 GREEN CT 2 30IN (SUTURE) IMPLANT
GAUZE SPONGE 4X4 12PLY STRL (GAUZE/BANDAGES/DRESSINGS) ×2 IMPLANT
GAUZE STRETCH 2X75IN STRL (MISCELLANEOUS) IMPLANT
GLOVE BIO SURGEON STRL SZ7.5 (GLOVE) ×6 IMPLANT
GLOVE INDICATOR 8.0 STRL GRN (GLOVE) ×4 IMPLANT
GOWN STRL REUS W/ TWL LRG LVL3 (GOWN DISPOSABLE) ×3 IMPLANT
GOWN STRL REUS W/TWL LRG LVL3 (GOWN DISPOSABLE) ×3
IV LACTATED RINGER IRRG 3000ML (IV SOLUTION) ×2
IV LR IRRIG 3000ML ARTHROMATIC (IV SOLUTION) ×2 IMPLANT
LABEL OR SOLS (LABEL) ×2 IMPLANT
MANIFOLD NEPTUNE II (INSTRUMENTS) ×2 IMPLANT
NDL SAFETY 18GX1.5 (NEEDLE) IMPLANT
NEEDLE HYPO 25GX1 SAFETY (NEEDLE) ×2 IMPLANT
NEEDLE HYPO 25X1 1.5 SAFETY (NEEDLE) ×2 IMPLANT
PACK ARTHROSCOPY KNEE (MISCELLANEOUS) ×2 IMPLANT
PENCIL ELECTRO HAND CTR (MISCELLANEOUS) IMPLANT
SET TUBE SUCT SHAVER OUTFL 24K (TUBING) ×2 IMPLANT
SET TUBE TIP INTRA-ARTICULAR (MISCELLANEOUS) ×2 IMPLANT
SPLINT FAST PLASTER 5X30 (CAST SUPPLIES) ×1
SPLINT PLASTER CAST FAST 5X30 (CAST SUPPLIES) ×1 IMPLANT
STOCKINETTE M/LG 89821 (MISCELLANEOUS) ×2 IMPLANT
STRAP ANKLE DISTRACTOR (MISCELLANEOUS) IMPLANT
STRAP ANKLE FOOT DISTRACTOR (ORTHOPEDIC SUPPLIES) IMPLANT
STRAP SAFETY BODY (MISCELLANEOUS) ×2 IMPLANT
SUT ETH BLK MONO 3 0 FS 1 12/B (SUTURE) IMPLANT
SUT ETHIBOND GREEN BRAID 0S 4 (SUTURE) IMPLANT
SUT ETHILON 4-0 (SUTURE)
SUT ETHILON 4-0 FS2 18XMFL BLK (SUTURE)
SUT VIC AB 3-0 SH 27 (SUTURE)
SUT VIC AB 3-0 SH 27X BRD (SUTURE) IMPLANT
SUT VIC AB 4-0 SH 27 (SUTURE)
SUT VIC AB 4-0 SH 27XANBCTRL (SUTURE) IMPLANT
SUTURE ETHLN 4-0 FS2 18XMF BLK (SUTURE) IMPLANT
SYRINGE 10CC LL (SYRINGE) ×2 IMPLANT
TUBING ARTHRO INFLOW-ONLY STRL (TUBING) ×2 IMPLANT
TUBING CONNECTING 10 (TUBING) ×2 IMPLANT
WAND ARTHRO PARAGON T2 (SURGICAL WAND) ×1 IMPLANT
WAND TENDON TOPAZ 0 ANGL (MISCELLANEOUS) ×2 IMPLANT

## 2015-05-07 NOTE — Op Note (Signed)
Operative note   Surgeon:Doreena Maulden    Assistant:none     Preop diagnosis:1.  Ankle osteochondral defect  2. Posterior tibial tendonitis all right ankle    Postop diagnosis: Same    Procedure: 1. Extensive arthroscopic debridement right ankle  2. Arthroscopic-assisted repair osteochondral defect right ankle 3. Tenolysis right posterior tibial tendon    XO:[id number]    Anesthesia:local and general    Hemostasis: Thigh tourniquet inflated to 350 mmHg for 69 minutes    Specimen:none     Complications:none     Operative indications:Rhonda Kane is an 45 y.o. that presents today for surgical intervention.  The risks/benefits/alternatives/complications have been discussed and consent has been given.    Procedure:  Patient was brought into the OR and placed on the operating table in thesupine position. After anesthesia was obtained theright lower extremity was prepped and draped in usual sterile fashion.  A thigh tourniquet was applied and inflated to 350 mmHg for 69 minutes. Attention was initially directed to the anterior aspect of the right ankle where at the anteromedial and anterolateral aspect of the ankle joint a small stab incision was made for the portals for the ankle arthroscopy.  Bluntdissection was taken down to the ankle joint. Fluid was removed from the joint and the anteromedial portal was entered with the ankle arthroscopy. At this time evaluation of the ankle joint revealed a fair amount of scar tissue on the anterolateral aspect of the ankle. The anterior central aspect of the ankle joint was normal and the anteromedial aspect of the ankle had minimal  to mild synovitis. Through the anterolateral portal that had been previously made the small joints shaver was placed. The extensive debridement of the anterolateral aspect of the ankle joint was performed. This revealed the small osteochondral defect on the fibular region. At this time this was debrided with the small  joint shaver. The small defect was then penetrated with a 90 awl. Next the Paragon wand was placed into this portion of the ankle overlying the small osteochondral defect.  Wand was also used for the  Debridement to the ankle joint where further synovitis was noted. The anteromedial aspect of the ankle was debrided with the small joint shaver. At this time all areas of the ankle joint were evaluated with no signs of further defect or scar tissue. The arthroscopy equipment was then removed.  Attention was then directed to the medial aspect of the ankle where a curved incision was made coursing overlying the posterior tibial tendon starting just proximal to the ankle joint and ending at approximately the navicular region. Sharp and blunt dissection carried down to the tendon sheath. The tendon she's were then noted and opened with a 15 blade. The posterior tibial tendon was then noted with only a mild amount of tenosynovitis along the course of the tendon. No fraying or nodular masses within the tendon were noted. The wound was flushed with copious amounts of irrigation. Next a Topaz wand was then used to infiltrate along the tendon  Beginning superior to the ankle joint and ending just proximal to the navicular tuberosity. The tendon sheath was then closed with a 3-0 Vicryl as well as the subcutaneous tissue. Subcuticular tissue was closed with a 4-0 Vicryl and the skin closed with a 3-0 nylon. The small portals were also closed with a 3-0 nylon. All areas were then infiltrated with 0.25% Marcaine plain. She was placed in a well compressive sterile dressing. She was then placed in a  posterior splint.    Patient tolerated the procedure and anesthesia well.  Was transported from the OR to the PACU with all vital signs stable and vascular status intact. To be discharged per routine protocol.  Will follow up in approximately 1 week in the outpatient clinic.

## 2015-05-07 NOTE — Transfer of Care (Signed)
Immediate Anesthesia Transfer of Care Note  Patient: Rhonda Kane  Procedure(s) Performed: Procedure(s) with comments: ANKLE ARTHROSCOPY  / WITH TENOLYSIS (Right) - right ankle arthroscopy with debridement, osteochondral repair, tenolysis of posterior tibial tendon  Patient Location: PACU  Anesthesia Type:General  Level of Consciousness: awake, alert  and oriented  Airway & Oxygen Therapy: Patient Spontanous Breathing and Patient connected to face mask oxygen  Post-op Assessment: Report given to RN and Post -op Vital signs reviewed and stable  Post vital signs: Reviewed and stable  Last Vitals:  Filed Vitals:   05/07/15 0605 05/07/15 0930  BP: 114/69 134/81  Pulse: 64 109  Temp: 36.4 C 36.6 C  Resp: 16 19    Complications: No apparent anesthesia complications

## 2015-05-07 NOTE — Discharge Instructions (Signed)
[place] [address] [place]   1. Take your medication as prescribed.  Pain medication should be taken only as needed.  2. Keep the dressing clean, dry and intact.  3. Keep your foot elevated above the heart level for the first 48 hours.  4. Walking to the bathroom and brief periods of walking are acceptable, unless we have instructed you to be non-weight bearing.  5. Always wear your post-op shoe when walking.  Always use your crutches if you are to be non-weight bearing.  6. Do not take a shower. Baths are permissible as long as the foot is kept out of the water.   7. Every hour you are awake:  - Bend your knee 15 times. - Massage calf 15 times  8. Call [facility] [phone]) if any of the following problems occur: - You develop a temperature or fever. - The bandage becomes saturated with blood. - Medication does not stop your pain. - Injury of the foot occurs. - Any symptoms of infection including redness, odor, or red streaks running from wound. [place] [address] [place]   Take your medication as prescribed.  Pain medication should be taken only as needed.  Keep the dressing clean, dry and intact.  Keep your foot elevated above the heart level for the first 48 hours.  Walking to the bathroom and brief periods of walking are acceptable, unless we have instructed you to be non-weight bearing.  Always wear your post-op shoe when walking.  Always use your crutches if you are to be non-weight bearing.  Do not take a shower. Baths are permissible as long as the foot is kept out of the water.   Every hour you are awake:  Bend your knee 15 times. Massage calf 15 times  Call [facility] [phone]) if any of the  following problems occur: You develop a temperature or fever. The bandage becomes saturated with blood. Medication does not stop your pain. Injury of the foot occurs. Any symptoms of infection including redness, odor, or red streaks running from wound. AMBULATORY SURGERY  DISCHARGE INSTRUCTIONS   1) The drugs that you were given will stay in your system until tomorrow so for the next 24 hours you should not:  A) Drive an automobile B) Make any legal decisions C) Drink any alcoholic beverage   2) You may resume regular meals tomorrow.  Today it is better to start with liquids and gradually work up to solid foods.  You may eat anything you prefer, but it is better to start with liquids, then soup and crackers, and gradually work up to solid foods.   3) Please notify your doctor immediately if you have any unusual bleeding, trouble breathing, redness and pain at the surgery site, drainage, fever, or pain not relieved by medication.    4) Additional Instructions:        Please contact your physician with any problems or Same Day Surgery at [phone], Monday through Friday 6 am to 4 pm, or [place] at [facility] number at [phone].

## 2015-05-07 NOTE — OR Nursing (Signed)
Patient provided with pamphlet for both crutch use and walker use for non-weight bearing. She has crutches here with her and reports having access to a walker.

## 2015-05-07 NOTE — Progress Notes (Signed)
Dr. Andree Elk in to see pt - advises her she will have to remove dentures prior to going OR.   He also advised her that earrings / studs can result in skin burn(s) if bovie used during surgery, but she has the right to refuse to remove them.  She verbalizes understanding of him re burn risk, refuses to remove, both ears taped over jewelry as instructed.  She denies any other jewelry on body.   Adrienne Allred notified of same during report.

## 2015-05-07 NOTE — Progress Notes (Signed)
Ancef 2 gm sent to OR with patient 

## 2015-05-07 NOTE — Anesthesia Postprocedure Evaluation (Signed)
Anesthesia Post Note  Patient: Rhonda Kane  Procedure(s) Performed: Procedure(s) (LRB): ANKLE ARTHROSCOPY  / WITH TENOLYSIS (Right)  Patient location during evaluation: PACU Anesthesia Type: General Level of consciousness: awake and alert Pain management: pain level controlled Vital Signs Assessment: post-procedure vital signs reviewed and stable Respiratory status: spontaneous breathing, nonlabored ventilation, respiratory function stable and patient connected to nasal cannula oxygen Cardiovascular status: blood pressure returned to baseline and stable Postop Assessment: no signs of nausea or vomiting Anesthetic complications: no    Last Vitals:  Filed Vitals:   05/07/15 1100 05/07/15 1105  BP: 135/78   Pulse: 100 99  Temp:  36.9 C  Resp: 16 25    Last Pain: There were no vitals filed for this visit.               Molli Barrows

## 2015-05-07 NOTE — H&P (Signed)
  HISTORY AND PHYSICAL INTERVAL NOTE:  05/07/2015  7:21 AM  Rhonda Kane  has presented today for surgery, with the diagnosis of OSTEOCHODRAL DEFECT OF ANKLE,TIBIALIS TENDINITIS OF RIGHT LOWER EXTREMITY.  The various methods of treatment have been discussed with the patient.  No guarantees were given.  After consideration of risks, benefits and other options for treatment, the patient has consented to surgery.  I have reviewed the patients' chart and labs.    Patient Vitals for the past 24 hrs:  BP Temp Temp src Pulse Resp SpO2 Height Weight  05/07/15 0605 114/69 mmHg 97.6 F (36.4 C) Tympanic 64 16 100 % 5\' 4"  (1.626 m) 100.699 kg (222 lb)    A history and physical examination was performed in my office.  The patient was reexamined.  There have been no changes to this history and physical examination.  Plan for ankle arthroscopy and posterior tibial tendon repair.  Rhonda Kane A

## 2015-05-07 NOTE — Anesthesia Procedure Notes (Addendum)
Procedure Name: Intubation Date/Time: 05/07/2015 7:37 AM Performed by: Jonna Clark Pre-anesthesia Checklist: Patient identified, Patient being monitored, Timeout performed, Emergency Drugs available and Suction available Patient Re-evaluated:Patient Re-evaluated prior to inductionOxygen Delivery Method: Circle system utilized Preoxygenation: Pre-oxygenation with 100% oxygen Intubation Type: IV induction Ventilation: Mask ventilation without difficulty Laryngoscope Size: Mac and 3 Grade View: Grade I Tube type: Oral Tube size: 7.0 mm Number of attempts: 1 Placement Confirmation: ETT inserted through vocal cords under direct vision,  positive ETCO2 and breath sounds checked- equal and bilateral Secured at: 21 cm Tube secured with: Tape Dental Injury: Teeth and Oropharynx as per pre-operative assessment    Anesthesia Regional Block:  Popliteal block  Pre-Anesthetic Checklist: ,, timeout performed, Correct Patient, Correct Site, Correct Laterality, Correct Procedure, Correct Position, site marked, Risks and benefits discussed,  Surgical consent,  Pre-op evaluation,  At surgeon's request and post-op pain management  Laterality: Right  Prep: chloraprep       Needles:  Injection technique: Single-shot  Needle Type: Echogenic Stimulator Needle     Needle Length: 5cm 5 cm Needle Gauge: 20 and 20 G    Additional Needles:  Procedures: ultrasound guided (picture in chart) Popliteal block  Nerve Stimulator or Paresthesia:  Response: 70 mA,   Additional Responses:   Narrative:  Start time: 05/07/2015 10:47 AM End time: 05/07/2015 11:02 AM Anesthesiologist: Molli Barrows  Additional Notes: Tolerated well.  Vsst. No pain with injection.  Marland Kitchen5% Ropivicaine with 1:200K epi incrementally injected. JA

## 2015-05-07 NOTE — Progress Notes (Signed)
CRNA in to take pt back to OR.  Patient refuses to remove dentures.  Discussion from CRNA with patient re safety and she will take denture cup back to OR with her and patient. \ Discussed postop boot going back w/pt with CRNA (Per Dr Vickki Muff, send to OR with patient) - CRNA will notify [place] of same.

## 2015-05-07 NOTE — Anesthesia Preprocedure Evaluation (Signed)
Anesthesia Evaluation  Patient identified by MRN, date of birth, ID band Patient awake    Reviewed: Allergy & Precautions, H&P , NPO status , Patient's Chart, lab work & pertinent test results, reviewed documented beta blocker date and time   History of Anesthesia Complications (+) PONV and history of anesthetic complications  Airway Mallampati: II  TM Distance: >3 FB Neck ROM: full    Dental  (+) Teeth Intact   Pulmonary neg pulmonary ROS,    Pulmonary exam normal        Cardiovascular Exercise Tolerance: Poor negative cardio ROS Normal cardiovascular exam Rate:Normal     Neuro/Psych  Headaches, PSYCHIATRIC DISORDERS negative neurological ROS  negative psych ROS   GI/Hepatic negative GI ROS, Neg liver ROS, GERD  Controlled,  Endo/Other  negative endocrine ROS  Renal/GU negative Renal ROS  negative genitourinary   Musculoskeletal   Abdominal   Peds  Hematology negative hematology ROS (+)   Anesthesia Other Findings   Reproductive/Obstetrics negative OB ROS                             Anesthesia Physical Anesthesia Plan  ASA: II  Anesthesia Plan: General LMA   Post-op Pain Management:    Induction: Intravenous  Airway Management Planned: LMA  Additional Equipment:   Intra-op Plan:   Post-operative Plan:   Informed Consent: I have reviewed the patients History and Physical, chart, labs and discussed the procedure including the risks, benefits and alternatives for the proposed anesthesia with the patient or authorized representative who has indicated his/her understanding and acceptance.   Dental Advisory Given  Plan Discussed with: CRNA  Anesthesia Plan Comments: (Pt refuses to remove earings despite advisory of potential injury.  Also pt is resistant to remove dentures.  RB of procedure reviewed in detail.  JA)        Anesthesia Quick Evaluation

## 2015-05-07 NOTE — OR Nursing (Signed)
Patient is able to move all toes on the right foot and they have brisk cap. Refill.

## 2015-05-07 NOTE — Progress Notes (Signed)
Patient refused to remove dentures and earrings.  Risks were discussed with patient and family.  Patient arrived in OR with tape over earrings and dentures still in mouth

## 2016-05-10 DIAGNOSIS — D696 Thrombocytopenia, unspecified: Secondary | ICD-10-CM | POA: Diagnosis not present

## 2016-05-10 DIAGNOSIS — J0100 Acute maxillary sinusitis, unspecified: Secondary | ICD-10-CM | POA: Diagnosis not present

## 2016-05-10 DIAGNOSIS — R109 Unspecified abdominal pain: Secondary | ICD-10-CM | POA: Diagnosis not present

## 2016-05-17 DIAGNOSIS — R300 Dysuria: Secondary | ICD-10-CM | POA: Diagnosis not present

## 2016-06-15 DIAGNOSIS — H60332 Swimmer's ear, left ear: Secondary | ICD-10-CM | POA: Diagnosis not present

## 2016-06-15 DIAGNOSIS — H6982 Other specified disorders of Eustachian tube, left ear: Secondary | ICD-10-CM | POA: Diagnosis not present

## 2016-09-11 ENCOUNTER — Other Ambulatory Visit: Payer: Self-pay | Admitting: Obstetrics and Gynecology

## 2016-09-13 ENCOUNTER — Other Ambulatory Visit: Payer: Self-pay | Admitting: Obstetrics and Gynecology

## 2016-10-10 DIAGNOSIS — J0100 Acute maxillary sinusitis, unspecified: Secondary | ICD-10-CM | POA: Diagnosis not present

## 2016-10-31 ENCOUNTER — Other Ambulatory Visit: Payer: Self-pay | Admitting: Obstetrics and Gynecology

## 2016-11-06 ENCOUNTER — Ambulatory Visit (INDEPENDENT_AMBULATORY_CARE_PROVIDER_SITE_OTHER): Payer: 59 | Admitting: Obstetrics and Gynecology

## 2016-11-06 VITALS — BP 132/82 | HR 80 | Ht 64.0 in | Wt 221.0 lb

## 2016-11-06 DIAGNOSIS — Z1151 Encounter for screening for human papillomavirus (HPV): Secondary | ICD-10-CM

## 2016-11-06 DIAGNOSIS — Z1231 Encounter for screening mammogram for malignant neoplasm of breast: Secondary | ICD-10-CM | POA: Diagnosis not present

## 2016-11-06 DIAGNOSIS — Z01419 Encounter for gynecological examination (general) (routine) without abnormal findings: Secondary | ICD-10-CM | POA: Diagnosis not present

## 2016-11-06 DIAGNOSIS — Z1239 Encounter for other screening for malignant neoplasm of breast: Secondary | ICD-10-CM

## 2016-11-06 DIAGNOSIS — Z713 Dietary counseling and surveillance: Secondary | ICD-10-CM | POA: Diagnosis not present

## 2016-11-06 DIAGNOSIS — N920 Excessive and frequent menstruation with regular cycle: Secondary | ICD-10-CM

## 2016-11-06 DIAGNOSIS — Z124 Encounter for screening for malignant neoplasm of cervix: Secondary | ICD-10-CM | POA: Diagnosis not present

## 2016-11-06 NOTE — Patient Instructions (Signed)
[facility]: [phone]  [facility]: [phone] 

## 2016-11-06 NOTE — Progress Notes (Signed)
PCP:  [place]land Pink, MD   Chief Complaint  Patient presents with  . Gynecologic Exam    Continuous Cycle Since 10/13/16     HPI:      Ms. Rhonda Kane is a 46 y.o. G1P1 who LMP was Patient's last menstrual period was 10/13/2016., presents today for her annual examination.  Her menses are absent with cont dosing of nuvaring. She didn't have a new one to put in 9/18 and started 4 wks of bleeding. Bleeding is mostly gone now. Dysmenorrhea with nuvaring none. She does not have intermenstrual bleeding. She has a hx of monthly menses, lasting 7 days and med to heavy flow if not on BC. She does have migraines with aura.   Sex activity: not active.  Last Pap: February 17, 2013  Results were: no abnormalities /neg HPV DNA  Hx of STDs: none  Last mammogram: November 05, 2014  Results were: normal--routine follow-up in 12 months There is no FH of breast cancer. There is no FH of ovarian cancer. The patient does not do self-breast exams.  Tobacco use: The patient denies current or previous tobacco use. Alcohol use: none No drug use.  Exercise: not active  She does not know get adequate calcium and Vitamin D in her diet.  She needs [facility] BMI completed. Has gained 5# since last seen 10/16. Is having issues with anxiety/depression, but doesn't think she is eating more. She has not time to exercise since works 10-12 hr shifts, 6 days a wk.  Labs with PCP.  Past Medical History:  Diagnosis Date  . Breast mass, right 2015  . Depression   . Dysmenorrhea   . GERD (gastroesophageal reflux disease)   . Headache    MIGRAINES  . Insomnia   . Insulin resistance   . Irregular menses   . Menometrorrhagia   . PMS (premenstrual syndrome)   . PONV (postoperative nausea and vomiting)     Past Surgical History:  Procedure Laterality Date  . ANKLE ARTHROSCOPY Right 05/07/2015   Procedure: ANKLE ARTHROSCOPY  / WITH TENOLYSIS;  Surgeon: [name], DPM;  Location: [facility] ORS;  Service:  Podiatry;  Laterality: Right;  right ankle arthroscopy with debridement, osteochondral repair, tenolysis of posterior tibial tendon  . BREAST FIBROADENOMA SURGERY Right 03/10/2013  . CARPAL TUNNEL RELEASE Left 09/11/2014   Procedure: CARPAL TUNNEL RELEASE;  Surgeon: Christophe Louis, MD;  Location: [facility] ORS;  Service: Orthopedics;  Laterality: Left;  . CHOLECYSTECTOMY      No family history on file.  Social History   Social History  . Marital status: Married    Spouse name: N/A  . Number of children: N/A  . Years of education: N/A   Occupational History  . Not on file.   Social History Main Topics  . Smoking status: Never Smoker  . Smokeless tobacco: Never Used  . Alcohol use No  . Drug use: No  . Sexual activity: Yes    Birth control/ protection: Other-see comments     Comment: Ring/Vasectomy   Other Topics Concern  . Not on file   Social History Narrative  . No narrative on file    Current Meds  Medication Sig  . ALPRAZolam (XANAX) 0.25 MG tablet Take 0.25 mg by mouth at bedtime as needed for anxiety.     ROS:  Review of Systems  Constitutional: Negative for fatigue, fever and unexpected weight change.  Respiratory: Negative for cough, shortness of breath and wheezing.   Cardiovascular: Negative for  chest pain, palpitations and leg swelling.  Gastrointestinal: Negative for blood in stool, constipation, diarrhea, nausea and vomiting.  Endocrine: Negative for cold intolerance, heat intolerance and polyuria.  Genitourinary: Negative for dyspareunia, dysuria, flank pain, frequency, genital sores, hematuria, menstrual problem, pelvic pain, urgency, vaginal bleeding, vaginal discharge and vaginal pain.  Musculoskeletal: Negative for back pain, joint swelling and myalgias.  Skin: Negative for rash.  Neurological: Negative for dizziness, syncope, light-headedness, numbness and headaches.  Hematological: Negative for adenopathy.  Psychiatric/Behavioral: Negative for  agitation, confusion, sleep disturbance and suicidal ideas. The patient is not nervous/anxious.      Objective: BP 132/82 (BP Location: Left Arm, Patient Position: Sitting, Cuff Size: Large)   Pulse 80   Ht 5\' 4"  (1.626 m)   Wt 221 lb (100.2 kg)   LMP 10/13/2016   BMI 37.93 kg/m    Physical Exam  Constitutional: She is oriented to person, place, and time. She appears well-developed and well-nourished.  Genitourinary: Vagina normal and uterus normal. There is no rash or tenderness on the right labia. There is no rash or tenderness on the left labia. No erythema or tenderness in the vagina. No vaginal discharge found. Right adnexum does not display mass and does not display tenderness. Left adnexum does not display mass and does not display tenderness. Cervix does not exhibit motion tenderness or polyp. Uterus is not enlarged or tender.  Neck: Normal range of motion. No thyromegaly present.  Cardiovascular: Normal rate, regular rhythm and normal heart sounds.   No murmur heard. Pulmonary/Chest: Effort normal and breath sounds normal. Right breast exhibits no mass, no nipple discharge, no skin change and no tenderness. Left breast exhibits no mass, no nipple discharge, no skin change and no tenderness.  Abdominal: Soft. There is no tenderness. There is no guarding.  Musculoskeletal: Normal range of motion.  Neurological: She is alert and oriented to person, place, and time. No cranial nerve deficit.  Psychiatric: She has a normal mood and affect. Her behavior is normal.  Vitals reviewed.   Assessment/Plan: Encounter for annual routine gynecological examination  Cervical cancer screening - Plan: IGP, Aptima HPV  Screening for HPV (human papillomavirus) - Plan: IGP, Aptima HPV  Screening for breast cancer - Pt to sched mammo. - Plan: MM DIGITAL SCREENING BILATERAL  Menorrhagia with regular cycle - Pt can no longer be on nuvaring due to migraines with aura. DIscussed prog only methods.  Pt interested in IUD. Mirena handout given. RTO with menses for IUD  Weight loss counseling, encounter for - [facility] form completed. Wt watchers/exercise.           GYN counsel mammography screening, family planning choices, adequate intake of calcium and vitamin D, diet and exercise     F/U  Return in about 1 year (around 11/06/2017).  Abby Tucholski B. Colum Colt, PA-C 11/06/2016 10:53 AM

## 2016-11-08 LAB — IGP, APTIMA HPV
HPV APTIMA: NEGATIVE
PAP SMEAR COMMENT: 0

## 2016-11-27 ENCOUNTER — Telehealth: Payer: Self-pay | Admitting: Obstetrics and Gynecology

## 2016-11-27 NOTE — Telephone Encounter (Signed)
Pt is schedule 11/30/16 for mirena insertion with ABC

## 2016-11-29 NOTE — Telephone Encounter (Signed)
Mirena stock reserved for this patient. 

## 2016-11-30 ENCOUNTER — Encounter: Payer: Self-pay | Admitting: Obstetrics and Gynecology

## 2016-11-30 ENCOUNTER — Ambulatory Visit: Payer: 59 | Admitting: Obstetrics and Gynecology

## 2016-11-30 VITALS — BP 132/80 | HR 77 | Ht 64.0 in | Wt 230.0 lb

## 2016-11-30 DIAGNOSIS — Z30430 Encounter for insertion of intrauterine contraceptive device: Secondary | ICD-10-CM | POA: Diagnosis not present

## 2016-11-30 MED ORDER — LEVONORGESTREL 20 MCG/24HR IU IUD: 1.0000 | INTRAUTERINE_SYSTEM | Freq: Once | INTRAUTERINE | 0 refills | Status: DC

## 2016-11-30 NOTE — Patient Instructions (Signed)
I value your feedback and entrusting us with your care. If you get a [facility] patient survey, I would appreciate you taking the time to let us know about your experience today. Thank you!  [facility] [phone]  Instructions after IUD insertion  Most women experience no significant problems after insertion of an IUD, however minor cramping and spotting for a few days is common. Cramps may be treated with ibuprofen 800mg every 8 hours or Tylenol 650 mg every 4 hours. Contact [facility] immediately if you experience any of the following symptoms during the next week: temperature >99.6 degrees, worsening pelvic pain, abdominal pain, fainting, unusually heavy vaginal bleeding, foul vaginal discharge, or if you think you have expelled the IUD.  Nothing inserted in the vagina for 48 hours. You will be scheduled for a follow up visit in approximately four weeks.  You should check monthly to be sure you can feel the IUD strings in the upper vagina. If you are having a monthly period, try to check after each period. If you cannot feel the IUD strings,  contact [facility] immediately so we can do an exam to determine if the IUD has been expelled.   Please use backup protection until we can confirm the IUD is in place.  Call [facility] if you are exposed to or diagnosed with a sexually transmitted infection, as we will need to discuss whether it is safe for you to continue using an IUD.   

## 2016-11-30 NOTE — Progress Notes (Signed)
   Chief Complaint  Patient presents with  . Contraception    Mirena     IUD PROCEDURE NOTE:  Rhonda Kane is a 46 y.o. G2P2 here for Mirena  IUD insertion for heavy periods without BC. Was on nuvaring.Hx of migraines with aura so can no longer be on nuvaring. Not sex active.  Last annual 10/18.  BP 132/80 (BP Location: Left Arm, Patient Position: Sitting, Cuff Size: Large)   Pulse 77   Ht 5\' 4"  (1.626 m)   Wt 230 lb (104.3 kg)   LMP 11/27/2016   BMI 39.48 kg/m   IUD Insertion Procedure Note Patient identified, informed consent performed, consent signed.   Discussed risks of irregular bleeding, cramping, infection, malpositioning or misplacement of the IUD outside the uterus which may require further procedure such as laparoscopy, risk of failure <1%. Time out was performed.    Speculum placed in the vagina.  Cervix visualized.  Cleaned with Betadine x 2.  Grasped anteriorly with a single tooth tenaculum.  Uterus sounded to 8.0 cm.   IUD placed per manufacturer's recommendations.  Strings trimmed to 3 cm. Tenaculum was removed, good hemostasis noted.  Patient tolerated procedure well.   ASSESSMENT:  Encounter for insertion of intrauterine contraceptive device (IUD) - Plan: levonorgestrel (MIRENA, 52 MG,) 20 MCG/24HR IUD   Meds ordered this encounter  Medications  . levonorgestrel (MIRENA, 52 MG,) 20 MCG/24HR IUD    Sig: 1 Intra Uterine Device (1 each total) once for 1 dose by Intrauterine route.    Dispense:  1 Intra Uterine Device    Refill:  0     Plan:  Patient was given post-procedure instructions.  She was advised to have backup contraception for one week.   Call if you are having increasing pain, cramps or bleeding or if you have a fever greater than 100.4 degrees F., shaking chills, nausea or vomiting. Patient was also asked to check IUD strings periodically and follow up in 4 weeks for IUD check.  Return in about 4 weeks (around 12/28/2016) for IUD  check.  Samyia Motter B. Anastaisa Wooding, PA-C 11/30/2016 4:10 PM

## 2016-12-28 ENCOUNTER — Ambulatory Visit (INDEPENDENT_AMBULATORY_CARE_PROVIDER_SITE_OTHER): Payer: 59 | Admitting: Obstetrics and Gynecology

## 2016-12-28 ENCOUNTER — Encounter: Payer: Self-pay | Admitting: Obstetrics and Gynecology

## 2016-12-28 VITALS — BP 130/88 | HR 71 | Ht 64.0 in | Wt 229.0 lb

## 2016-12-28 DIAGNOSIS — Z975 Presence of (intrauterine) contraceptive device: Secondary | ICD-10-CM | POA: Diagnosis not present

## 2016-12-28 DIAGNOSIS — Z30431 Encounter for routine checking of intrauterine contraceptive device: Secondary | ICD-10-CM | POA: Diagnosis not present

## 2016-12-28 DIAGNOSIS — N921 Excessive and frequent menstruation with irregular cycle: Secondary | ICD-10-CM

## 2016-12-28 DIAGNOSIS — R102 Pelvic and perineal pain: Secondary | ICD-10-CM

## 2016-12-28 NOTE — Patient Instructions (Signed)
I value your feedback and entrusting us with your care. If you get a [facility] patient survey, I would appreciate you taking the time to let us know about your experience today. Thank you! 

## 2016-12-28 NOTE — Progress Notes (Signed)
   Chief Complaint  Patient presents with  . Contraception     History of Present Illness:  Rhonda Kane is a 46 y.o. that had a Mirena IUD placed approximately 1 month ago. Since that time, she has had daily bleeding/spotting and cramping. Severity of dysmen waxes and wanes, improved with ibup. Bleeding is heavier now due to menses starting this wk. She has not been sex active since IUD insertion. She had IUD placed for menorrhagia. Can no longer be on nuvaring due to migraine with aura.   Review of Systems  Constitutional: Negative for fever.  Gastrointestinal: Negative for blood in stool, constipation, diarrhea, nausea and vomiting.  Genitourinary: Positive for pelvic pain and vaginal bleeding. Negative for dyspareunia, dysuria, flank pain, frequency, hematuria, urgency, vaginal discharge and vaginal pain.  Musculoskeletal: Negative for back pain.  Skin: Negative for rash.    Physical Exam:  BP 130/88   Pulse 71   Ht 5\' 4"  (1.626 m)   Wt 229 lb (103.9 kg)   BMI 39.31 kg/m  Body mass index is 39.31 kg/m.  Pelvic exam:  Two IUD strings present seen coming from the cervical os. EGBUS, vaginal vault and cervix: within normal limits   Assessment:  Routine checking of IUD Encounter for routine checking of intrauterine contraceptive device (IUD) - IUD in place. Neg exam.  Breakthrough bleeding with IUD - Reassurance. F/u prn.   Pelvic cramping - With IUD. F/u after menses if sx severity persists for GYN u/s.   IUD strings present in proper location; pt doing well  Plan: F/u if any signs of infection or can no longer feel the strings.   Rhonda Edgington B. Phong Isenberg, PA-C 12/28/2016 11:27 AM

## 2017-11-13 ENCOUNTER — Ambulatory Visit: Payer: 59 | Admitting: Obstetrics and Gynecology

## 2017-12-13 ENCOUNTER — Encounter: Payer: Self-pay | Admitting: Emergency Medicine

## 2017-12-13 ENCOUNTER — Emergency Department: Payer: Managed Care, Other (non HMO)

## 2017-12-13 ENCOUNTER — Observation Stay
Admission: EM | Admit: 2017-12-13 | Discharge: 2017-12-15 | Disposition: A | Discharge status: Home / Self Care | Payer: Managed Care, Other (non HMO) | Attending: Specialist | Admitting: Specialist

## 2017-12-13 ENCOUNTER — Other Ambulatory Visit: Payer: Self-pay

## 2017-12-13 DIAGNOSIS — E669 Obesity, unspecified: Secondary | ICD-10-CM | POA: Diagnosis not present

## 2017-12-13 DIAGNOSIS — F419 Anxiety disorder, unspecified: Secondary | ICD-10-CM | POA: Insufficient documentation

## 2017-12-13 DIAGNOSIS — Z792 Long term (current) use of antibiotics: Secondary | ICD-10-CM | POA: Insufficient documentation

## 2017-12-13 DIAGNOSIS — Z793 Long term (current) use of hormonal contraceptives: Secondary | ICD-10-CM | POA: Diagnosis not present

## 2017-12-13 DIAGNOSIS — F329 Major depressive disorder, single episode, unspecified: Secondary | ICD-10-CM | POA: Diagnosis not present

## 2017-12-13 DIAGNOSIS — D539 Nutritional anemia, unspecified: Secondary | ICD-10-CM | POA: Diagnosis not present

## 2017-12-13 DIAGNOSIS — D649 Anemia, unspecified: Secondary | ICD-10-CM

## 2017-12-13 DIAGNOSIS — Z66 Do not resuscitate: Secondary | ICD-10-CM | POA: Diagnosis not present

## 2017-12-13 DIAGNOSIS — Z791 Long term (current) use of non-steroidal anti-inflammatories (NSAID): Secondary | ICD-10-CM | POA: Insufficient documentation

## 2017-12-13 DIAGNOSIS — Z79899 Other long term (current) drug therapy: Secondary | ICD-10-CM | POA: Diagnosis not present

## 2017-12-13 DIAGNOSIS — D709 Neutropenia, unspecified: Secondary | ICD-10-CM

## 2017-12-13 LAB — COMPREHENSIVE METABOLIC PANEL
ALBUMIN: 4.2 g/dL (ref 3.5–5.0)
ALK PHOS: 74 U/L (ref 38–126)
ALT: 17 U/L (ref 0–44)
AST: 25 U/L (ref 15–41)
Anion gap: 8 (ref 5–15)
BUN: 12 mg/dL (ref 6–20)
CALCIUM: 9 mg/dL (ref 8.9–10.3)
CO2: 25 mmol/L (ref 22–32)
CREATININE: 0.76 mg/dL (ref 0.44–1.00)
Chloride: 104 mmol/L (ref 98–111)
GFR calc Af Amer: >60 mL/min (ref >60)
GFR calc non Af Amer: >60 mL/min (ref >60)
GLUCOSE: 102 mg/dL — AB (ref 70–99)
Potassium: 4.1 mmol/L (ref 3.5–5.1)
SODIUM: 137 mmol/L (ref 135–145)
Total Bilirubin: 0.8 mg/dL (ref 0.3–1.2)
Total Protein: 7.5 g/dL (ref 6.5–8.1)

## 2017-12-13 LAB — CBC WITH DIFFERENTIAL/PLATELET
Abs Immature Granulocytes: 0.01 10*3/uL (ref 0.00–0.07)
BASOS PCT: 0 %
Basophils Absolute: 0 10*3/uL (ref 0.0–0.1)
EOS PCT: 0 %
Eosinophils Absolute: 0 10*3/uL (ref 0.0–0.5)
HCT: 16.2 % — ABNORMAL LOW (ref 36.0–46.0)
HEMOGLOBIN: 5 g/dL — AB (ref 12.0–15.0)
Immature Granulocytes: 0 %
LYMPHS PCT: 34 %
Lymphs Abs: 1.1 10*3/uL (ref 0.7–4.0)
MCH: 36 pg — ABNORMAL HIGH (ref 26.0–34.0)
MCHC: 30.9 g/dL (ref 30.0–36.0)
MCV: 116.5 fL — ABNORMAL HIGH (ref 80.0–100.0)
MONOS PCT: 20 %
Monocytes Absolute: 0.6 10*3/uL (ref 0.1–1.0)
Neutro Abs: 1.5 10*3/uL — ABNORMAL LOW (ref 1.7–7.7)
Neutrophils Relative %: 46 %
Platelets: 270 10*3/uL (ref 150–400)
RBC: 1.39 MIL/uL — AB (ref 3.87–5.11)
RDW: 17.8 % — ABNORMAL HIGH (ref 11.5–15.5)
WBC: 3.2 10*3/uL — AB (ref 4.0–10.5)
nRBC: 0 % (ref 0.0–0.2)

## 2017-12-13 LAB — PREPARE RBC (CROSSMATCH)

## 2017-12-13 MED ORDER — SENNOSIDES-DOCUSATE SODIUM 8.6-50 MG PO TABS: 1.0000 | ORAL_TABLET | Freq: Every evening | ORAL | Status: DC | PRN

## 2017-12-13 MED ORDER — SODIUM CHLORIDE 0.9% FLUSH: 3.0000 mL | INTRAVENOUS | Status: DC | PRN

## 2017-12-13 MED ORDER — TRAZODONE HCL 50 MG PO TABS
50.0000 mg | ORAL_TABLET | Freq: Every evening | ORAL | Status: DC | PRN
  Administered 2017-12-13 – 2017-12-14 (×2): 50 mg via ORAL
  Filled 2017-12-13 (×2): qty 1

## 2017-12-13 MED ORDER — ALPRAZOLAM 0.25 MG PO TABS: 0.2500 mg | ORAL_TABLET | Freq: Every evening | ORAL | Status: DC | PRN

## 2017-12-13 MED ORDER — SODIUM CHLORIDE 0.9 % IV SOLN: 250.0000 mL | INTRAVENOUS | Status: DC | PRN

## 2017-12-13 MED ORDER — ALBUTEROL SULFATE (2.5 MG/3ML) 0.083% IN NEBU: 2.5000 mg | INHALATION_SOLUTION | RESPIRATORY_TRACT | Status: DC | PRN

## 2017-12-13 MED ORDER — BISACODYL 5 MG PO TBEC: 5.0000 mg | DELAYED_RELEASE_TABLET | Freq: Every day | ORAL | Status: DC | PRN

## 2017-12-13 MED ORDER — SODIUM CHLORIDE 0.9% FLUSH
3.0000 mL | Freq: Two times a day (BID) | INTRAVENOUS | Status: DC
  Administered 2017-12-14 (×3): 3 mL via INTRAVENOUS

## 2017-12-13 MED ORDER — ACETAMINOPHEN 325 MG PO TABS: 650.0000 mg | ORAL_TABLET | Freq: Four times a day (QID) | ORAL | Status: DC | PRN

## 2017-12-13 MED ORDER — ACETAMINOPHEN 650 MG RE SUPP: 650.0000 mg | Freq: Four times a day (QID) | RECTAL | Status: DC | PRN

## 2017-12-13 MED ORDER — HYDROCODONE-ACETAMINOPHEN 5-325 MG PO TABS
1.0000 | ORAL_TABLET | ORAL | Status: DC | PRN
  Administered 2017-12-13: 2 via ORAL
  Filled 2017-12-13: qty 2

## 2017-12-13 MED ORDER — SODIUM CHLORIDE 0.9 % IV SOLN
10.0000 mL/h | Freq: Once | INTRAVENOUS | Status: AC
  Administered 2017-12-14: 10 mL/h via INTRAVENOUS

## 2017-12-13 MED ORDER — ONDANSETRON HCL 4 MG/2ML IJ SOLN: 4.0000 mg | Freq: Four times a day (QID) | INTRAMUSCULAR | Status: DC | PRN

## 2017-12-13 MED ORDER — ONDANSETRON HCL 4 MG PO TABS: 4.0000 mg | ORAL_TABLET | Freq: Four times a day (QID) | ORAL | Status: DC | PRN

## 2017-12-13 MED ORDER — BUSPIRONE HCL 5 MG PO TABS
5.0000 mg | ORAL_TABLET | Freq: Two times a day (BID) | ORAL | Status: DC
  Administered 2017-12-14 – 2017-12-15 (×3): 5 mg via ORAL
  Filled 2017-12-13 (×5): qty 1

## 2017-12-13 NOTE — H&P (Signed)
[name] NAME: Rhonda Kane    MR#:  [id number]  DATE OF BIRTH:  04-07-1970  DATE OF ADMISSION:  12/13/2017  PRIMARY CARE PHYSICIAN: Rhonda Pink, MD   REQUESTING/REFERRING PHYSICIAN: Dr. Rip Kane.  CHIEF COMPLAINT:   Chief Complaint  Patient presents with  . Abnormal Lab   Low hemoglobin 5.1. HISTORY OF PRESENT ILLNESS:  Rhonda Kane  is a 47 y.o. female with a known history of breast mass, depression, GERD, headache, insomnia.  The patient is sent by PCP due to abnormal labs with hemoglobin 5.1.  She said that she has had generalized weakness, headache, chest discomfort and dyspnea on exertion for about 1 months.  She also complains of left and right upper quadrant pain. She follow-up with her primary care doctor and get labs drawn this week.  She was called by her primary care doctor about abnormal lab and came to ED for further evaluation.  She denies any melena, bloody stool, hematuria or easy bleeding.  Hemoglobin in ED is 5.0.  MCV is 116.5.  Abdominal ultrasound is unremarkable.  Dr. Rip Kane requested admission. PAST MEDICAL HISTORY:   Past Medical History:  Diagnosis Date  . Breast mass, right 2015  . Depression   . Dysmenorrhea   . GERD (gastroesophageal reflux disease)   . Headache    MIGRAINES  . Insomnia   . Insulin resistance   . Irregular menses   . Menometrorrhagia   . Migraine with aura   . PMS (premenstrual syndrome)   . PONV (postoperative nausea and vomiting)     PAST SURGICAL HISTORY:   Past Surgical History:  Procedure Laterality Date  . ANKLE ARTHROSCOPY Right 05/07/2015   Procedure: ANKLE ARTHROSCOPY  / WITH TENOLYSIS;  Surgeon: [name], DPM;  Location: [facility] ORS;  Service: Podiatry;  Laterality: Right;  right ankle arthroscopy with debridement, osteochondral repair, tenolysis of posterior tibial tendon  . BREAST FIBROADENOMA SURGERY Right 03/10/2013  . CARPAL TUNNEL RELEASE Left  09/11/2014   Procedure: CARPAL TUNNEL RELEASE;  Surgeon: Christophe Louis, MD;  Location: [facility] ORS;  Service: Orthopedics;  Laterality: Left;  . CHOLECYSTECTOMY      SOCIAL HISTORY:   Social History   Tobacco Use  . Smoking status: Never Smoker  . Smokeless tobacco: Never Used  Substance Use Topics  . Alcohol use: No    FAMILY HISTORY:  History reviewed. No pertinent family history. father deceased.   DRUG ALLERGIES:  No Known Allergies  REVIEW OF SYSTEMS:   Review of Systems  Constitutional: Positive for malaise/fatigue. Negative for chills and fever.  HENT: Negative for sore throat.   Eyes: Negative for blurred vision and double vision.  Respiratory: Positive for shortness of breath. Negative for cough, hemoptysis, sputum production, wheezing and stridor.   Cardiovascular: Negative for chest pain, palpitations, orthopnea and leg swelling.       Chest discomfort.  Gastrointestinal: Negative for abdominal pain, blood in stool, diarrhea, melena, nausea and vomiting.  Genitourinary: Negative for dysuria, flank pain and hematuria.  Musculoskeletal: Negative for back pain and joint pain.  Skin: Negative for rash.  Neurological: Positive for dizziness and headaches. Negative for tingling, sensory change, focal weakness, seizures, loss of consciousness and weakness.  Endo/Heme/Allergies: Negative for polydipsia.  Psychiatric/Behavioral: Negative for depression. The patient is not nervous/anxious.     MEDICATIONS AT HOME:   Prior to Admission medications   Medication Sig Start Date End Date Taking? Authorizing Provider  ALPRAZolam Duanne Moron)  0.25 MG tablet Take 0.25 mg by mouth at bedtime as needed for anxiety.   Yes [provider]  azithromycin (ZITHROMAX) 250 MG tablet Take 250 mg by mouth daily. 12/11/17  Yes [provider]  busPIRone (BUSPAR) 5 MG tablet Take 5 mg by mouth 2 (two) times daily. 12/11/17 12/11/18 Yes [provider]  ibuprofen  (ADVIL,MOTRIN) 600 MG tablet Take 600 mg by mouth daily as needed.   Yes [provider]  meloxicam (MOBIC) 15 MG tablet Take 1 tablet (15 mg total) by mouth daily. 09/14/14  Yes Hyatt, Max T, DPM  traZODone (DESYREL) 50 MG tablet Take 50 mg by mouth daily as needed.   Yes [provider]  venlafaxine (EFFEXOR) 100 MG tablet Take 100 mg by mouth every morning. 300 MG   Yes [provider]  levonorgestrel (MIRENA, 52 MG,) 20 MCG/24HR IUD 1 Intra Uterine Device (1 each total) once for 1 dose by Intrauterine route. 11/30/16 40/10/27  Copland, Deirdre Evener, PA-C      VITAL SIGNS:  Blood pressure 100/62, pulse 93, temperature 98.4 F (36.9 C), temperature source Oral, resp. rate 19, height 5\' 4"  (1.626 m), weight 105.5 kg, SpO2 98 %.  PHYSICAL EXAMINATION:  Physical Exam  GENERAL:  47 y.o.-year-old patient lying in the bed with no acute distress.  Obesity. EYES: Pupils equal, round, reactive to light and accommodation. No scleral icterus. Extraocular muscles intact.  HEENT: Head atraumatic, normocephalic. Oropharynx and nasopharynx clear.  NECK:  Supple, no jugular venous distention. No thyroid enlargement, no tenderness.  LUNGS: Normal breath sounds bilaterally, no wheezing, rales,rhonchi or crepitation. No use of accessory muscles of respiration.  Tenderness on palpation on both rib cages under breasts. CARDIOVASCULAR: S1, S2 normal. No murmurs, rubs, or gallops.  ABDOMEN: Soft, nontender, nondistended. Bowel sounds present. No organomegaly or mass.  EXTREMITIES: No pedal edema, cyanosis, or clubbing.  NEUROLOGIC: Cranial nerves II through XII are intact. Muscle strength 5/5 in all extremities. Sensation intact. Gait not checked.  PSYCHIATRIC: The patient is alert and oriented x 3.  SKIN: No obvious rash, lesion, or ulcer.   LABORATORY PANEL:   CBC Recent Labs  Lab 12/13/17 1739  WBC 3.2*  HGB 5.0*  HCT 16.2*  PLT 270    ------------------------------------------------------------------------------------------------------------------  Chemistries  Recent Labs  Lab 12/13/17 1739  NA 137  K 4.1  CL 104  CO2 25  GLUCOSE 102*  BUN 12  CREATININE 0.76  CALCIUM 9.0  AST 25  ALT 17  ALKPHOS 74  BILITOT 0.8   ------------------------------------------------------------------------------------------------------------------  Cardiac Enzymes No results for input(s): TROPONINI in the last 168 hours. ------------------------------------------------------------------------------------------------------------------  RADIOLOGY:  US Abdomen Complete  Result Date: 12/13/2017 CLINICAL DATA:  Upper abdominal pain for 2 days. Anemia. Prior cholecystectomy. EXAM: ABDOMEN ULTRASOUND COMPLETE COMPARISON:  None. FINDINGS: Gallbladder: Surgically absent. Common bile duct: Diameter: 3 mm, within normal limits. Liver: Mildly increased echogenicity of the hepatic parenchyma, consistent with hepatic steatosis. No focal mass lesion identified. Portal vein is patent on color Doppler imaging with normal direction of blood flow towards the liver. IVC: No abnormality visualized. Pancreas: Visualized portion unremarkable. Spleen: Size and appearance within normal limits. Right Kidney: Length: 11.3 cm. Echogenicity within normal limits. No mass or hydronephrosis visualized. Left Kidney: Length: 11.2 cm. Echogenicity within normal limits. No mass or hydronephrosis visualized. Abdominal aorta: No aneurysm visualized. Other findings: None. IMPRESSION: Prior cholecystectomy. No evidence of biliary ductal dilatation or other acute findings. Mild hepatic steatosis. Electronically Signed   By:  Earle Gell M.D.   On: 12/13/2017 19:06      IMPRESSION AND PLAN:   Symptomatic macrocytic anemia. The patient will be placed for observation. PRBC transfusion 2 unit, follow-up hemoglobin in a.m. Anemia work-up and hematology  consult.  Leukopenia.  Hematology consult. Obesity.  Follow-up with PCP.  All the records are reviewed and case discussed with ED provider. Management plans discussed with the patient, family and they are in agreement.  CODE STATUS: DNR.  TOTAL TIME TAKING CARE OF THIS PATIENT: 35 minutes.    Demetrios Loll M.D on 12/13/2017 at 8:54 PM  Between 7am to 6pm - Pager - [phone]  After 6pm go to [email or web address] - Proofreader  [facility]  Office  [phone]  CC: Primary care physician; Rhonda Pink, MD   Note: This dictation was prepared with Dragon dictation along with smaller phrase technology. Any transcriptional errors that result from this process are unin

## 2017-12-13 NOTE — ED Notes (Signed)
Rate increased to 262ml per hour. Pt tolerating well.

## 2017-12-13 NOTE — ED Notes (Signed)
Admitting  Provider at bedside.  Dr.Chen

## 2017-12-13 NOTE — ED Notes (Signed)
Patient is resting comfortably. 

## 2017-12-13 NOTE — ED Triage Notes (Signed)
Pt presents to ED via POV with c/o abnormal labs from Dr. Verneda Skill office. Pt states per Dr. Kary Kos, hgb 5.1, states was called yesterday by MD.

## 2017-12-13 NOTE — ED Provider Notes (Addendum)
[facility] Emergency Department Provider Note  \ ____________________________________________   First MD Initiated Contact with Patient 12/13/17 1738     (approximate)  I have reviewed the triage vital signs and the nursing notes.   HISTORY  Chief Complaint Abnormal Lab  HPI Rhonda Kane is a 47 y.o. female he reports she is been feeling poorly for months gets short of breath with exertion tired all the time feels weak all the time her doctor called her yesterday I believe and told her that she had a hemoglobin of 5.1 and she come to the emergency room.  Patient had a history of very heavy menses but had an IUD placed 2 months ago and that is now much better.  She denies any black tarry stools or any other problems she does have some pain in the right and left upper quadrants of her abdomen comes and goes nothing seems to bring it on or make it worse it is not worse with deep breathing does not seem to be worse with exercise patient has had her gallbladder out when it comes on is moderately severe.  If she is not exercising and she is laying still she is not short of breath   Past Medical History:  Diagnosis Date  . Breast mass, right 2015  . Depression   . Dysmenorrhea   . GERD (gastroesophageal reflux disease)   . Headache    MIGRAINES  . Insomnia   . Insulin resistance   . Irregular menses   . Menometrorrhagia   . Migraine with aura   . PMS (premenstrual syndrome)   . PONV (postoperative nausea and vomiting)     There are no active problems to display for this patient.   Past Surgical History:  Procedure Laterality Date  . ANKLE ARTHROSCOPY Right 05/07/2015   Procedure: ANKLE ARTHROSCOPY  / WITH TENOLYSIS;  Surgeon: [name], DPM;  Location: [facility] ORS;  Service: Podiatry;  Laterality: Right;  right ankle arthroscopy with debridement, osteochondral repair, tenolysis of posterior tibial tendon  . BREAST FIBROADENOMA SURGERY Right  03/10/2013  . CARPAL TUNNEL RELEASE Left 09/11/2014   Procedure: CARPAL TUNNEL RELEASE;  Surgeon: Christophe Louis, MD;  Location: [facility] ORS;  Service: Orthopedics;  Laterality: Left;  . CHOLECYSTECTOMY      Prior to Admission medications   Medication Sig Start Date End Date Taking? Authorizing Provider  ALPRAZolam Duanne Moron) 0.25 MG tablet Take 0.25 mg by mouth at bedtime as needed for anxiety.    [provider]  levonorgestrel (MIRENA, 52 MG,) 20 MCG/24HR IUD 1 Intra Uterine Device (1 each total) once for 1 dose by Intrauterine route. 11/30/16 76/73/41  Copland, Deirdre Evener, PA-C  meloxicam (MOBIC) 15 MG tablet Take 1 tablet (15 mg total) by mouth daily. 09/14/14   Hyatt, Max T, DPM  venlafaxine (EFFEXOR) 100 MG tablet Take 100 mg by mouth every morning. 300 MG    [provider]    Allergies Patient has no known allergies.  History reviewed. No pertinent family history.  Social History Social History   Tobacco Use  . Smoking status: Never Smoker  . Smokeless tobacco: Never Used  Substance Use Topics  . Alcohol use: No  . Drug use: No    Review of Systems  Constitutional: No fever/chills Eyes: No visual changes. ENT: No sore throat. Cardiovascular: Denies chest pain. Respiratory: Denies shortness of breath unless is exercising. Gastrointestinal: Abdominal pain see HPI no nausea, no vomiting.  No diarrhea.  No  constipation. Genitourinary: Negative for dysuria. Musculoskeletal: Negative for back pain. Skin: Negative for rash. Neurological: Negative for headaches, focal weakness   ____________________________________________   PHYSICAL EXAM:  VITAL SIGNS: ED Triage Vitals  Enc Vitals Group     BP 12/13/17 1734 134/69     Pulse Rate 12/13/17 1734 96     Resp 12/13/17 1734 20     Temp 12/13/17 1734 98.2 F (36.8 C)     Temp Source 12/13/17 1734 Oral     SpO2 12/13/17 1734 100 %     Weight 12/13/17 1728 229 lb 0.9 oz (103.9 kg)     Height 12/13/17  1728 5\' 4"  (1.626 m)     Head Circumference --      Peak Flow --      Pain Score 12/13/17 1728 0     Pain Loc --      Pain Edu? --      Excl. in [place]? --     Constitutional: Alert and oriented. Well appearing and in no acute distress. Eyes: Conjunctivae are normal. Head: Atraumatic. Nose: No congestion/rhinnorhea. Mouth/Throat: Mucous membranes are moist.  Oropharynx non-erythematous. Neck: No stridor.  Cardiovascular: Normal rate, regular rhythm. Grossly normal heart sounds.  Good peripheral circulation. Respiratory: Normal respiratory effort.  No retractions. Lungs CTAB. Gastrointestinal: Soft palpation in the right upper quadrant reproduces the right upper quadrant pain.  No distention. No abdominal bruits. No CVA tenderness. Musculoskeletal: No lower extremity tenderness nor edema.  Neurologic:  Normal speech and language. No gross focal neurologic deficits are appreciated. No gait instability. Skin:  Skin is warm, dry and intact. No rash noted. Psychiatric: Mood and affect are normal. Speech and behavior are normal.  ____________________________________________   LABS (all labs ordered are listed, but only abnormal results are displayed)  Labs Reviewed  CBC WITH DIFFERENTIAL/PLATELET  COMPREHENSIVE METABOLIC PANEL   ____________________________________________  EKG  EKG read and interpreted by me shows sinus tachycardia at a rate of 102 normal axis irregular baseline no obvious acute ST-T changes ____________________________________________  RADIOLOGY  ED MD interpretation:   Official radiology report(s): No results found.  ____________________________________________   PROCEDURES  Procedure(s) performed:   Procedures  Critical Care performed: Critical care time 20 minutes.  This includes evaluating the patient getting consent for the transfusion with her discussing her case with the hospitalist and reviewing some of her old  records.  ____________________________________________   INITIAL IMPRESSION / ASSESSMENT AND PLAN / ED COURSE  Obtain consent for transfusion from patient reviewed risks and benefits including transfusion reaction allergic reaction transmission of infections etc. patient says she just wants to feel better soon as possible.  Agrees to transfusion.       ____________________________________________   FINAL CLINICAL IMPRESSION(S) / ED DIAGNOSES  Final diagnoses:  Symptomatic anemia     ED Discharge Orders    None       Note:  This document was prepared using Dragon voice recognition software and may include unintentional dictation errors.    Nena Polio, MD 12/13/17 1753    Nena Polio, MD 12/21/17 [phone]

## 2017-12-13 NOTE — ED Notes (Signed)
Type and screen sent to the lab

## 2017-12-13 NOTE — Progress Notes (Signed)
Advanced Care Plan.  Purpose of Encounter: CODE STATUS. Parties in Attendance: The patient, her son and me. Patient's Decisional Capacity: Yes. Medical Story: Rhonda Kane  is a 47 y.o. female with a known history of breast mass, depression, GERD, headache, insomnia. I discussed with the patient about her current condition, prognosis and CODE STATUS.  The patient stated that she does not want to be resuscitated or intubated if she has cardiopulmonary arrest.   Plan:  Code Status: DNR. Time spent discussing advance care planning: 17 minutes.

## 2017-12-13 NOTE — ED Notes (Signed)
Pt to the ER for RUQ abd pain and weakness. Pt had labs drawn on Tuesday and was sent by the MD for a low HGB. Pt has had some dizziness when standing. Pt is alert and oriented and in no acute distress.

## 2017-12-14 DIAGNOSIS — R531 Weakness: Secondary | ICD-10-CM | POA: Diagnosis not present

## 2017-12-14 DIAGNOSIS — R5383 Other fatigue: Secondary | ICD-10-CM | POA: Diagnosis not present

## 2017-12-14 DIAGNOSIS — D709 Neutropenia, unspecified: Secondary | ICD-10-CM

## 2017-12-14 DIAGNOSIS — D539 Nutritional anemia, unspecified: Secondary | ICD-10-CM | POA: Diagnosis not present

## 2017-12-14 LAB — HEMOGLOBIN: Hemoglobin: 8.4 g/dL — ABNORMAL LOW (ref 12.0–15.0)

## 2017-12-14 LAB — CBC
HCT: 21.4 % — ABNORMAL LOW (ref 36.0–46.0)
Hemoglobin: 7 g/dL — ABNORMAL LOW (ref 12.0–15.0)
MCH: 34 pg (ref 26.0–34.0)
MCHC: 32.7 g/dL (ref 30.0–36.0)
MCV: 103.9 fL — ABNORMAL HIGH (ref 80.0–100.0)
NRBC: 0.7 % — AB (ref 0.0–0.2)
Platelets: 252 10*3/uL (ref 150–400)
RBC: 2.06 MIL/uL — ABNORMAL LOW (ref 3.87–5.11)
RDW: 22.9 % — ABNORMAL HIGH (ref 11.5–15.5)
WBC: 2.9 10*3/uL — ABNORMAL LOW (ref 4.0–10.5)

## 2017-12-14 LAB — IRON AND TIBC
Iron: 157 ug/dL (ref 28–170)
Saturation Ratios: 52 % — ABNORMAL HIGH (ref 10.4–31.8)
TIBC: 305 ug/dL (ref 250–450)
UIBC: 148 ug/dL

## 2017-12-14 LAB — RETICULOCYTES
Immature Retic Fract: 19.2 % — ABNORMAL HIGH (ref 2.3–15.9)
RBC.: 2.05 MIL/uL — ABNORMAL LOW (ref 3.87–5.11)
Retic Count, Absolute: 27.1 10*3/uL (ref 19.0–186.0)
Retic Ct Pct: 1.3 % (ref 0.4–3.1)

## 2017-12-14 LAB — BASIC METABOLIC PANEL
Anion gap: 8 (ref 5–15)
BUN: 10 mg/dL (ref 6–20)
CO2: 25 mmol/L (ref 22–32)
Calcium: 8.6 mg/dL — ABNORMAL LOW (ref 8.9–10.3)
Chloride: 106 mmol/L (ref 98–111)
Creatinine, Ser: 0.76 mg/dL (ref 0.44–1.00)
GFR calc Af Amer: >60 mL/min (ref >60)
GFR calc non Af Amer: >60 mL/min (ref >60)
Glucose, Bld: 106 mg/dL — ABNORMAL HIGH (ref 70–99)
Potassium: 3.8 mmol/L (ref 3.5–5.1)
Sodium: 139 mmol/L (ref 135–145)

## 2017-12-14 LAB — URIC ACID: Uric Acid, Serum: 5.9 mg/dL (ref 2.5–7.1)

## 2017-12-14 LAB — FOLATE: Folate: 5.4 ng/mL — ABNORMAL LOW (ref >5.9)

## 2017-12-14 LAB — FERRITIN: Ferritin: 124 ng/mL (ref 11–307)

## 2017-12-14 LAB — APTT: aPTT: 32 seconds (ref 24–36)

## 2017-12-14 LAB — PROTIME-INR
INR: 1.19
PROTHROMBIN TIME: 15 s (ref 11.4–15.2)

## 2017-12-14 LAB — PREPARE RBC (CROSSMATCH)

## 2017-12-14 LAB — PATHOLOGIST SMEAR REVIEW

## 2017-12-14 LAB — VITAMIN B12: Vitamin B-12: 336 pg/mL (ref 180–914)

## 2017-12-14 LAB — TSH: TSH: 3.832 u[IU]/mL (ref 0.350–4.500)

## 2017-12-14 LAB — LACTATE DEHYDROGENASE: LDH: 396 U/L — AB (ref 98–192)

## 2017-12-14 MED ORDER — FOLIC ACID 1 MG PO TABS: 1.0000 mg | ORAL_TABLET | Freq: Every day | ORAL | Status: DC

## 2017-12-14 MED ORDER — VENLAFAXINE HCL ER 75 MG PO CP24: 150.0000 mg | ORAL_CAPSULE | Freq: Every day | ORAL | Status: DC

## 2017-12-14 MED ORDER — VENLAFAXINE HCL ER 75 MG PO CP24
150.0000 mg | ORAL_CAPSULE | Freq: Every day | ORAL | Status: DC
  Administered 2017-12-14 – 2017-12-15 (×2): 150 mg via ORAL
  Filled 2017-12-14 (×2): qty 2

## 2017-12-14 MED ORDER — CYANOCOBALAMIN 1000 MCG/ML IJ SOLN
1000.0000 ug | Freq: Every day | INTRAMUSCULAR | Status: AC
  Administered 2017-12-14 – 2017-12-15 (×2): 1000 ug via INTRAMUSCULAR
  Filled 2017-12-14 (×2): qty 1

## 2017-12-14 MED ORDER — SODIUM CHLORIDE 0.9% IV SOLUTION
Freq: Once | INTRAVENOUS | Status: AC
  Administered 2017-12-14: 11:00:00 via INTRAVENOUS
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
## 2017-12-14 NOTE — Plan of Care (Signed)

## 2017-12-14 NOTE — Progress Notes (Signed)
[facility] [place] NAME: Rhonda Kane    MR#:  [id number]  DATE OF BIRTH:  [birth year]  SUBJECTIVE:   Patient presents to the hospital secondary to symptomatic anemia.  Patient presented to the hospital with a hemoglobin of 5.0 and noted to have a macrocytic anemia.  She denies any melena, hematochezia or any evidence of acute hematuria or bleeding.  REVIEW OF SYSTEMS:    Review of Systems  Constitutional: Negative for chills and fever.  HENT: Negative for congestion and tinnitus.   Eyes: Negative for blurred vision and double vision.  Respiratory: Negative for cough, shortness of breath and wheezing.   Cardiovascular: Negative for chest pain, orthopnea and PND.  Gastrointestinal: Negative for abdominal pain, diarrhea, nausea and vomiting.  Genitourinary: Negative for dysuria and hematuria.  Neurological: Positive for weakness (Generalized). Negative for dizziness, sensory change and focal weakness.  All other systems reviewed and are negative.   Nutrition: Regular Tolerating Diet: Yes Tolerating PT: Await Eval.   DRUG ALLERGIES:  No Known Allergies  VITALS:  Blood pressure 131/84, pulse 73, temperature 98.4 F (36.9 C), temperature source Oral, resp. rate 16, height 5\' 4"  (1.626 m), weight 105.5 kg, SpO2 98 %.  PHYSICAL EXAMINATION:   Physical Exam  GENERAL:  47 y.o.-year-old patient lying in bed in no acute distress.  EYES: Pupils equal, round, reactive to light and accommodation. No scleral icterus. Extraocular muscles intact. Pale conjunctiva.  HEENT: Head atraumatic, normocephalic. Oropharynx and nasopharynx clear.  NECK:  Supple, no jugular venous distention. No thyroid enlargement, no tenderness.  LUNGS: Normal breath sounds bilaterally, no wheezing, rales, rhonchi. No use of accessory muscles of respiration.  CARDIOVASCULAR: S1, S2 normal. No murmurs, rubs, or gallops.  ABDOMEN: Soft, nontender, nondistended.  Bowel sounds present. No organomegaly or mass.  EXTREMITIES: No cyanosis, clubbing or edema b/l.    NEUROLOGIC: Cranial nerves II through XII are intact. No focal Motor or sensory deficits b/l.    PSYCHIATRIC: The patient is alert and oriented x 3.  SKIN: No obvious rash, lesion, or ulcer.    LABORATORY PANEL:   CBC Recent Labs  Lab 12/14/17 0527  WBC 2.9*  HGB 7.0*  HCT 21.4*  PLT 252   ------------------------------------------------------------------------------------------------------------------  Chemistries  Recent Labs  Lab 12/13/17 1739 12/14/17 0527  NA 137 139  K 4.1 3.8  CL 104 106  CO2 25 25  GLUCOSE 102* 106*  BUN 12 10  CREATININE 0.76 0.76  CALCIUM 9.0 8.6*  AST 25  --   ALT 17  --   ALKPHOS 74  --   BILITOT 0.8  --    ------------------------------------------------------------------------------------------------------------------  Cardiac Enzymes No results for input(s): TROPONINI in the last 168 hours. ------------------------------------------------------------------------------------------------------------------  RADIOLOGY:  US Abdomen Complete  Result Date: 12/13/2017 CLINICAL DATA:  Upper abdominal pain for 2 days. Anemia. Prior cholecystectomy. EXAM: ABDOMEN ULTRASOUND COMPLETE COMPARISON:  None. FINDINGS: Gallbladder: Surgically absent. Common bile duct: Diameter: 3 mm, within normal limits. Liver: Mildly increased echogenicity of the hepatic parenchyma, consistent with hepatic steatosis. No focal mass lesion identified. Portal vein is patent on color Doppler imaging with normal direction of blood flow towards the liver. IVC: No abnormality visualized. Pancreas: Visualized portion unremarkable. Spleen: Size and appearance within normal limits. Right Kidney: Length: 11.3 cm. Echogenicity within normal limits. No mass or hydronephrosis visualized. Left Kidney: Length: 11.2 cm. Echogenicity within normal limits. No mass or hydronephrosis  visualized. Abdominal aorta: No aneurysm visualized.  Other findings: None. IMPRESSION: Prior cholecystectomy. No evidence of biliary ductal dilatation or other acute findings. Mild hepatic steatosis. Electronically Signed   By: Earle Gell M.D.   On: 12/13/2017 19:06     ASSESSMENT AND PLAN:   47 year old female with past medical history of anxiety/depression, previous history of a right breast mass who presents to the hospital due to weakness, exertional dyspnea noted to have symptomatic anemia.  1.  Macrocytic anemia- etiology unclear presently.  Patient presented to the hospital with hemoglobin of 5.0.  MCV is over 110.  B12, folate levels are still pending.  Patient's iron studies are within normal range. - Patient has been transfused 2 units of packed red blood cells and hemoglobin is up to 7.0.  We will give her 1 more unit of blood today.   - Discussed with hematology oncology and will await their further evaluation.  They recommended checking Hemolysis labs and therefore haptoglobin LDH has been ordered.  Await their review of the peripheral smear.  2.  Leukopenia-etiology unclear.  Await hematology oncology input.  Await peripheral smear review.  3.  Anxiety/depression-continue Effexor, BuSpar, Xanax.     All the records are reviewed and case discussed with Care Management/Social Worker. Management plans discussed with the patient, family and they are in agreement.  CODE STATUS: Full code  DVT Prophylaxis: Ted's & SCD's.   TOTAL TIME TAKING CARE OF THIS PATIENT: 30 minutes.   POSSIBLE D/C IN 1-2 DAYS, DEPENDING ON CLINICAL CONDITION.   Henreitta Leber M.D on 12/14/2017 at 12:21 PM  Between 7am to 6pm - Pager - [phone]  After 6pm go to [email or web address] - Proofreader  [facility]  Office  [phone]  CC: Primary care physician; Maryland Pink, MD

## 2017-12-14 NOTE — Plan of Care (Signed)
  Problem: Education: Goal: Knowledge of General Education information will improve Description Including pain rating scale, medication(s)/side effects and non-pharmacologic comfort measures 12/14/2017 1611 by Herbie Baltimore, RN Outcome: Progressing 12/14/2017 1611 by Herbie Baltimore, RN Outcome: Progressing   Problem: Health Behavior/Discharge Planning: Goal: Ability to manage health-related needs will improve 12/14/2017 1611 by Herbie Baltimore, RN Outcome: Progressing 12/14/2017 1611 by Herbie Baltimore, RN Outcome: Progressing   Problem: Clinical Measurements: Goal: Ability to maintain clinical measurements within normal limits will improve 12/14/2017 1611 by Herbie Baltimore, RN Outcome: Progressing 12/14/2017 1611 by Herbie Baltimore, RN Outcome: Progressing Goal: Will remain free from infection 12/14/2017 1611 by Herbie Baltimore, RN Outcome: Progressing 12/14/2017 1611 by Herbie Baltimore, RN Outcome: Progressing Goal: Diagnostic test results will improve 12/14/2017 1611 by Herbie Baltimore, RN Outcome: Progressing 12/14/2017 1611 by Herbie Baltimore, RN Outcome: Progressing Goal: Respiratory complications will improve 12/14/2017 1611 by Herbie Baltimore, RN Outcome: Progressing 12/14/2017 1611 by Herbie Baltimore, RN Outcome: Progressing Goal: Cardiovascular complication will be avoided 12/14/2017 1611 by Herbie Baltimore, RN Outcome: Progressing 12/14/2017 1611 by Herbie Baltimore, RN Outcome: Progressing   Problem: Activity: Goal: Risk for activity intolerance will decrease 12/14/2017 1611 by Herbie Baltimore, RN Outcome: Progressing 12/14/2017 1611 by Herbie Baltimore, RN Outcome: Progressing   Problem: Nutrition: Goal: Adequate nutrition will be maintained 12/14/2017 1611 by Herbie Baltimore, RN Outcome: Progressing 12/14/2017 1611 by Herbie Baltimore, RN Outcome:  Progressing   Problem: Coping: Goal: Level of anxiety will decrease 12/14/2017 1611 by Herbie Baltimore, RN Outcome: Progressing 12/14/2017 1611 by Herbie Baltimore, RN Outcome: Progressing   Problem: Elimination: Goal: Will not experience complications related to bowel motility 12/14/2017 1611 by Herbie Baltimore, RN Outcome: Progressing 12/14/2017 1611 by Herbie Baltimore, RN Outcome: Progressing Goal: Will not experience complications related to urinary retention 12/14/2017 1611 by Herbie Baltimore, RN Outcome: Progressing 12/14/2017 1611 by Herbie Baltimore, RN Outcome: Progressing   Problem: Pain Managment: Goal: General experience of comfort will improve 12/14/2017 1611 by Herbie Baltimore, RN Outcome: Progressing 12/14/2017 1611 by Herbie Baltimore, RN Outcome: Progressing   Problem: Safety: Goal: Ability to remain free from injury will improve 12/14/2017 1611 by Herbie Baltimore, RN Outcome: Progressing 12/14/2017 1611 by Herbie Baltimore, RN Outcome: Progressing   Problem: Skin Integrity: Goal: Risk for impaired skin integrity will decrease 12/14/2017 1611 by Herbie Baltimore, RN Outcome: Progressing 12/14/2017 1611 by Herbie Baltimore, RN Outcome: Progressing

## 2017-12-15 DIAGNOSIS — D539 Nutritional anemia, unspecified: Secondary | ICD-10-CM | POA: Diagnosis not present

## 2017-12-15 DIAGNOSIS — R5383 Other fatigue: Secondary | ICD-10-CM | POA: Diagnosis not present

## 2017-12-15 DIAGNOSIS — R531 Weakness: Secondary | ICD-10-CM | POA: Diagnosis not present

## 2017-12-15 DIAGNOSIS — D709 Neutropenia, unspecified: Secondary | ICD-10-CM | POA: Diagnosis not present

## 2017-12-15 LAB — CBC
HCT: 26.4 % — ABNORMAL LOW (ref 36.0–46.0)
Hemoglobin: 8.8 g/dL — ABNORMAL LOW (ref 12.0–15.0)
MCH: 32.8 pg (ref 26.0–34.0)
MCHC: 33.3 g/dL (ref 30.0–36.0)
MCV: 98.5 fL (ref 80.0–100.0)
Platelets: 282 10*3/uL (ref 150–400)
RBC: 2.68 MIL/uL — ABNORMAL LOW (ref 3.87–5.11)
RDW: 21.8 % — AB (ref 11.5–15.5)
WBC: 3.1 10*3/uL — ABNORMAL LOW (ref 4.0–10.5)
nRBC: 0 % (ref 0.0–0.2)

## 2017-12-15 LAB — TYPE AND SCREEN
ABO/RH(D): O POS
ANTIBODY SCREEN: NEGATIVE
UNIT DIVISION: 0
Unit division: 0
Unit division: 0

## 2017-12-15 LAB — BPAM RBC
BLOOD PRODUCT EXPIRATION DATE: 201912232359
Blood Product Expiration Date: 201912222359
Blood Product Expiration Date: 201912232359
ISSUE DATE / TIME: 201911290020
ISSUE DATE / TIME: 201911291312
ISSUE DATE / TIME: 201911282010
Unit Type and Rh: 5100
Unit Type and Rh: 5100
Unit Type and Rh: 5100

## 2017-12-15 LAB — HIV ANTIBODY (ROUTINE TESTING W REFLEX): HIV Screen 4th Generation wRfx: NONREACTIVE

## 2017-12-15 LAB — HAPTOGLOBIN: Haptoglobin: 126 mg/dL (ref 34–200)

## 2017-12-15 MED ORDER — FOLIC ACID 1 MG PO TABS: 1.0000 mg | ORAL_TABLET | Freq: Every day | ORAL | 1 refills | Status: DC

## 2017-12-15 NOTE — Discharge Summary (Signed)
[facility] NAME: Rhonda Kane    MR#:  [id number]  DATE OF BIRTH:  07/01/1970  DATE OF ADMISSION:  12/13/2017 ADMITTING PHYSICIAN: Demetrios Loll, MD  DATE OF DISCHARGE: 12/15/2017  PRIMARY CARE PHYSICIAN: Maryland Pink, MD    ADMISSION DIAGNOSIS:  Symptomatic anemia [D64.9]  DISCHARGE DIAGNOSIS:  Active Problems:   Macrocytic anemia   Neutropenia ([place])   SECONDARY DIAGNOSIS:   Past Medical History:  Diagnosis Date  . Breast mass, right 2015  . Depression   . Dysmenorrhea   . GERD (gastroesophageal reflux disease)   . Headache    MIGRAINES  . Insomnia   . Insulin resistance   . Irregular menses   . Menometrorrhagia   . Migraine with aura   . PMS (premenstrual syndrome)   . PONV (postoperative nausea and vomiting)     HOSPITAL COURSE:   47 year old female with past medical history of anxiety/depression, previous history of a right breast mass who presents to the hospital due to weakness, exertional dyspnea noted to have symptomatic anemia.  1.  Macrocytic anemia- Patient presented to the hospital with hemoglobin of 5.0.  MCV is over 110.   -Patient's B12 and folate levels are within normal range.  Patient was also empirically given B12 injections in the hospital x2 doses and also is being discharged on oral folate supplements. - Hematology oncology consult was obtained and they looked at patient's peripheral smear with pathology and there is some concern for possible underlying malignancy/leukemia.  Patient will likely need a bone marrow biopsy. - After transfusion patient is asymptomatic hemoglobin is up to over 8.  She will be discharged home and follow-up with oncology as an outpatient for possible bone marrow or referral to a tertiary care center pending flow cytometry results and further work-up.  Patient is in agreement with this plan.  This plan was also discussed with oncology and patient to follow-up with them  next week.  2.  Leukopenia- cont. Care as mentioned above.    3.  Anxiety/depression- pt. Will continue Effexor, BuSpar, Xanax  DISCHARGE CONDITIONS:   Stable.   CONSULTS OBTAINED:    DRUG ALLERGIES:  No Known Allergies  DISCHARGE MEDICATIONS:   Allergies as of 12/15/2017   No Known Allergies     Medication List    STOP taking these medications   azithromycin 250 MG tablet Commonly known as:  ZITHROMAX   levonorgestrel 20 MCG/24HR IUD Commonly known as:  MIRENA     TAKE these medications   ALPRAZolam 0.25 MG tablet Commonly known as:  XANAX Take 0.25 mg by mouth at bedtime as needed for anxiety.   busPIRone 5 MG tablet Commonly known as:  BUSPAR Take 5 mg by mouth 2 (two) times daily.   folic acid 1 MG tablet Commonly known as:  FOLVITE Take 1 tablet (1 mg total) by mouth daily.   ibuprofen 600 MG tablet Commonly known as:  ADVIL,MOTRIN Take 600 mg by mouth daily as needed.   meloxicam 15 MG tablet Commonly known as:  MOBIC Take 1 tablet (15 mg total) by mouth daily.   traZODone 50 MG tablet Commonly known as:  DESYREL Take 50 mg by mouth daily as needed.   venlafaxine 100 MG tablet Commonly known as:  EFFEXOR Take 100 mg by mouth every morning. 300 MG         DISCHARGE INSTRUCTIONS:   DIET:  Regular diet  DISCHARGE CONDITION:  Stable  ACTIVITY:  Activity as tolerated  OXYGEN:  Home Oxygen: No.   Oxygen Delivery: room air  DISCHARGE LOCATION:  home   If you experience worsening of your admission symptoms, develop shortness of breath, life threatening emergency, suicidal or homicidal thoughts you must seek medical attention immediately by calling 911 or calling your MD immediately  if symptoms less severe.  You Must read complete instructions/literature along with all the possible adverse reactions/side effects for all the Medicines you take and that have been prescribed to you. Take any new Medicines after you have completely  understood and accpet all the possible adverse reactions/side effects.   Please note  You were cared for by a hospitalist during your hospital stay. If you have any questions about your discharge medications or the care you received while you were in the hospital after you are discharged, you can call the unit and asked to speak with the hospitalist on call if the hospitalist that took care of you is not available. Once you are discharged, your primary care physician will handle any further medical issues. Please note that NO REFILLS for any discharge medications will be authorized once you are discharged, as it is imperative that you return to your primary care physician (or establish a relationship with a primary care physician if you do not have one) for your aftercare needs so that they can reassess your need for medications and monitor your lab values.     Today   No chest pain, shortness of breath. No N/V.  No complaints. Will d/c home with outpatient Hem/Onc follow up.    VITAL SIGNS:  Blood pressure 121/72, pulse 77, temperature 98.5 F (36.9 C), temperature source Oral, resp. rate 16, height _0  (1.626 m), weight 105.5 kg, SpO2 93 %.  I/O:  No intake or output data in the 24 hours ending 12/15/17 1227  PHYSICAL EXAMINATION:   GENERAL:  47 y.o.-year-old patient lying in bed in no acute distress.  EYES: Pupils equal, round, reactive to light and accommodation. No scleral icterus. Extraocular muscles intact. Pale conjunctiva.  HEENT: Head atraumatic, normocephalic. Oropharynx and nasopharynx clear.  NECK:  Supple, no jugular venous distention. No thyroid enlargement, no tenderness.  LUNGS: Normal breath sounds bilaterally, no wheezing, rales, rhonchi. No use of accessory muscles of respiration.  CARDIOVASCULAR: S1, S2 normal. No murmurs, rubs, or gallops.  ABDOMEN: Soft, nontender, nondistended. Bowel sounds present. No organomegaly or mass.  EXTREMITIES: No cyanosis, clubbing or  edema b/l.    NEUROLOGIC: Cranial nerves II through XII are intact. No focal Motor or sensory deficits b/l.    PSYCHIATRIC: The patient is alert and oriented x 3.  SKIN: No obvious rash, lesion, or ulcer.   DATA REVIEW:   CBC Recent Labs  Lab 12/15/17 0427  WBC 3.1*  HGB 8.8*  HCT 26.4*  PLT 282    Chemistries  Recent Labs  Lab 12/13/17 1739 12/14/17 0527  NA 137 139  K 4.1 3.8  CL 104 106  CO2 25 25  GLUCOSE 102* 106*  BUN 12 10  CREATININE 0.76 0.76  CALCIUM 9.0 8.6*  AST 25  --   ALT 17  --   ALKPHOS 74  --   BILITOT 0.8  --     Cardiac Enzymes No results for input(s): TROPONINI in the last 168 hours.  Microbiology Results  No results found for this or any previous visit.  RADIOLOGY:  US Abdomen Complete  Result Date: 12/13/2017 CLINICAL DATA:  Upper abdominal pain  for 2 days. Anemia. Prior cholecystectomy. EXAM: ABDOMEN ULTRASOUND COMPLETE COMPARISON:  None. FINDINGS: Gallbladder: Surgically absent. Common bile duct: Diameter: 3 mm, within normal limits. Liver: Mildly increased echogenicity of the hepatic parenchyma, consistent with hepatic steatosis. No focal mass lesion identified. Portal vein is patent on color Doppler imaging with normal direction of blood flow towards the liver. IVC: No abnormality visualized. Pancreas: Visualized portion unremarkable. Spleen: Size and appearance within normal limits. Right Kidney: Length: 11.3 cm. Echogenicity within normal limits. No mass or hydronephrosis visualized. Left Kidney: Length: 11.2 cm. Echogenicity within normal limits. No mass or hydronephrosis visualized. Abdominal aorta: No aneurysm visualized. Other findings: None. IMPRESSION: Prior cholecystectomy. No evidence of biliary ductal dilatation or other acute findings. Mild hepatic steatosis. Electronically Signed   By: Earle Gell M.D.   On: 12/13/2017 19:06      Management plans discussed with the patient, family and they are in agreement.  CODE STATUS:      Code Status Orders  (From admission, onward)         Start     Ordered   12/13/17 2244  Do not attempt resuscitation (DNR)  Continuous    Question Answer Comment  In the event of cardiac or respiratory ARREST Do not call a "code blue"   In the event of cardiac or respiratory ARREST Do not perform Intubation, CPR, defibrillation or ACLS   In the event of cardiac or respiratory ARREST Use medication by any route, position, wound care, and other measures to relive pain and suffering. May use oxygen, suction and manual treatment of airway obstruction as needed for comfort.      12/13/17 2243        Code Status History    This patient has a current code status but no historical code status.      TOTAL TIME TAKING CARE OF THIS PATIENT: 40 minutes.    Henreitta Leber M.D on 12/15/2017 at 12:27 PM  Between 7am to 6pm - Pager - [phone]  After 6pm go to [email or web address] - Proofreader  [facility]  Office  [phone]  CC: Primary care physician; Maryland Pink, MD

## 2017-12-15 NOTE — Plan of Care (Signed)

## 2017-12-15 NOTE — Progress Notes (Signed)
Patient discharged home with son. IV removed, verbalized understanding of education. Patient with no complaints.
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
## 2017-12-16 LAB — FOLATE RBC
Folate, Hemolysate: 235.5 ng/mL
Folate, RBC: 1369 ng/mL (ref >498)
Hematocrit: 17.2 % — CL (ref 34.0–46.6)

## 2017-12-17 ENCOUNTER — Telehealth: Payer: Self-pay | Admitting: Oncology

## 2017-12-17 NOTE — Telephone Encounter (Signed)
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  Called patient and discussed with her about results.  Refer her to [facility] malignancy hematology. Discussed with Dr.Coombs about her case over the phone. I called patient again and ask her to go to [facility] Emergency room and she will be admitted by malignant Hem service,.

## 2017-12-19 LAB — COMP PANEL: LEUKEMIA/LYMPHOMA: Immunophenotypic Profile: 26

## 2017-12-19 MED ORDER — GENERIC EXTERNAL MEDICATION
2.00 | Status: DC
Start: ? — End: 2017-12-19

## 2017-12-19 MED ORDER — GENERIC EXTERNAL MEDICATION
5.00 | Status: DC
Start: ? — End: 2017-12-19

## 2017-12-19 MED ORDER — BUSPIRONE HCL 10 MG PO TABS
5.00 | ORAL_TABLET | ORAL | Status: DC
Start: 2017-12-20 — End: 2017-12-19

## 2017-12-19 MED ORDER — LOPERAMIDE HCL 2 MG PO CAPS
4.00 | ORAL_CAPSULE | ORAL | Status: DC
Start: ? — End: 2017-12-19

## 2017-12-19 MED ORDER — LOPERAMIDE HCL 2 MG PO CAPS
2.00 | ORAL_CAPSULE | ORAL | Status: DC
Start: ? — End: 2017-12-19

## 2017-12-19 MED ORDER — GENERIC EXTERNAL MEDICATION
8.00 | Status: DC
Start: ? — End: 2017-12-19

## 2017-12-19 MED ORDER — ACETAMINOPHEN 325 MG PO TABS
650.00 | ORAL_TABLET | ORAL | Status: DC
Start: ? — End: 2017-12-19

## 2017-12-19 MED ORDER — LUBRISKIN EX LOTN
1.00 | TOPICAL_LOTION | CUTANEOUS | Status: DC
Start: ? — End: 2017-12-19

## 2017-12-19 MED ORDER — GENERIC EXTERNAL MEDICATION
12.50 | Status: DC
Start: ? — End: 2017-12-19

## 2017-12-19 MED ORDER — ALLOPURINOL 300 MG PO TABS
300.00 | ORAL_TABLET | ORAL | Status: DC
Start: 2017-12-20 — End: 2017-12-19

## 2017-12-19 MED ORDER — MORPHINE SULFATE 2 MG/ML IJ SOLN
2.00 | INTRAMUSCULAR | Status: DC
Start: ? — End: 2017-12-19

## 2017-12-19 MED ORDER — MELATONIN 3 MG PO TABS
3.00 | ORAL_TABLET | ORAL | Status: DC
Start: ? — End: 2017-12-19

## 2017-12-19 MED ORDER — ALPRAZOLAM 0.5 MG PO TABS
0.25 | ORAL_TABLET | ORAL | Status: DC
Start: ? — End: 2017-12-19

## 2017-12-19 MED ORDER — VENLAFAXINE HCL ER 150 MG PO CP24
150.00 | ORAL_CAPSULE | ORAL | Status: DC
Start: 2017-12-20 — End: 2017-12-19

## 2017-12-19 MED ORDER — INFLUENZA VAC SPLIT QUAD 0.5 ML IM SUSY
0.50 | PREFILLED_SYRINGE | INTRAMUSCULAR | Status: DC
Start: ? — End: 2017-12-19

## 2017-12-19 MED ORDER — SODIUM CHLORIDE 0.9 % IV SOLN
20.00 | INTRAVENOUS | Status: DC
Start: ? — End: 2017-12-19

## 2017-12-19 MED ORDER — POLYETHYLENE GLYCOL 3350 17 G PO PACK
17.00 | PACK | ORAL | Status: DC
Start: ? — End: 2017-12-19

## 2017-12-23 LAB — ABO/RH: ABO/RH(D): O POS

## 2017-12-24 ENCOUNTER — Inpatient Hospital Stay: Payer: Managed Care, Other (non HMO) | Admitting: Oncology

## 2018-01-16 DIAGNOSIS — I509 Heart failure, unspecified: Secondary | ICD-10-CM | POA: Diagnosis not present

## 2018-01-16 DIAGNOSIS — J9692 Respiratory failure, unspecified with hypercapnia: Secondary | ICD-10-CM | POA: Diagnosis not present

## 2018-01-16 DIAGNOSIS — Z9221 Personal history of antineoplastic chemotherapy: Secondary | ICD-10-CM | POA: Diagnosis not present

## 2018-01-16 DIAGNOSIS — N179 Acute kidney failure, unspecified: Secondary | ICD-10-CM | POA: Diagnosis not present

## 2018-01-16 DIAGNOSIS — J9691 Respiratory failure, unspecified with hypoxia: Secondary | ICD-10-CM | POA: Diagnosis not present

## 2018-01-16 DIAGNOSIS — C9500 Acute leukemia of unspecified cell type not having achieved remission: Secondary | ICD-10-CM | POA: Diagnosis not present

## 2018-01-16 DIAGNOSIS — I5020 Unspecified systolic (congestive) heart failure: Secondary | ICD-10-CM | POA: Diagnosis not present

## 2018-01-17 DIAGNOSIS — C9500 Acute leukemia of unspecified cell type not having achieved remission: Secondary | ICD-10-CM | POA: Diagnosis not present

## 2018-01-17 DIAGNOSIS — J9691 Respiratory failure, unspecified with hypoxia: Secondary | ICD-10-CM | POA: Diagnosis not present

## 2018-01-17 DIAGNOSIS — I499 Cardiac arrhythmia, unspecified: Secondary | ICD-10-CM | POA: Diagnosis not present

## 2018-01-17 DIAGNOSIS — I509 Heart failure, unspecified: Secondary | ICD-10-CM | POA: Diagnosis not present

## 2018-01-17 DIAGNOSIS — C9200 Acute myeloblastic leukemia, not having achieved remission: Secondary | ICD-10-CM | POA: Diagnosis not present

## 2018-01-18 DIAGNOSIS — R9431 Abnormal electrocardiogram [ECG] [EKG]: Secondary | ICD-10-CM | POA: Diagnosis not present

## 2018-01-18 DIAGNOSIS — R079 Chest pain, unspecified: Secondary | ICD-10-CM | POA: Diagnosis not present

## 2018-01-18 DIAGNOSIS — J810 Acute pulmonary edema: Secondary | ICD-10-CM | POA: Diagnosis not present

## 2018-01-18 DIAGNOSIS — I509 Heart failure, unspecified: Secondary | ICD-10-CM | POA: Diagnosis not present

## 2018-01-18 DIAGNOSIS — J9691 Respiratory failure, unspecified with hypoxia: Secondary | ICD-10-CM | POA: Diagnosis not present

## 2018-01-19 DIAGNOSIS — J9691 Respiratory failure, unspecified with hypoxia: Secondary | ICD-10-CM | POA: Diagnosis not present

## 2018-01-19 DIAGNOSIS — R918 Other nonspecific abnormal finding of lung field: Secondary | ICD-10-CM | POA: Diagnosis not present

## 2018-01-19 DIAGNOSIS — J810 Acute pulmonary edema: Secondary | ICD-10-CM | POA: Diagnosis not present

## 2018-01-19 DIAGNOSIS — M7989 Other specified soft tissue disorders: Secondary | ICD-10-CM | POA: Diagnosis not present

## 2018-01-19 DIAGNOSIS — I509 Heart failure, unspecified: Secondary | ICD-10-CM | POA: Diagnosis not present

## 2018-01-19 DIAGNOSIS — M79601 Pain in right arm: Secondary | ICD-10-CM | POA: Diagnosis not present

## 2018-01-19 DIAGNOSIS — J9601 Acute respiratory failure with hypoxia: Secondary | ICD-10-CM | POA: Diagnosis not present

## 2018-01-20 DIAGNOSIS — J810 Acute pulmonary edema: Secondary | ICD-10-CM | POA: Diagnosis not present

## 2018-01-20 DIAGNOSIS — I5021 Acute systolic (congestive) heart failure: Secondary | ICD-10-CM | POA: Diagnosis not present

## 2018-01-20 DIAGNOSIS — J9691 Respiratory failure, unspecified with hypoxia: Secondary | ICD-10-CM | POA: Diagnosis not present

## 2018-01-21 DIAGNOSIS — C9500 Acute leukemia of unspecified cell type not having achieved remission: Secondary | ICD-10-CM | POA: Diagnosis not present

## 2018-01-21 DIAGNOSIS — I5021 Acute systolic (congestive) heart failure: Secondary | ICD-10-CM | POA: Diagnosis not present

## 2018-01-21 DIAGNOSIS — J810 Acute pulmonary edema: Secondary | ICD-10-CM | POA: Diagnosis not present

## 2018-01-22 DIAGNOSIS — C9500 Acute leukemia of unspecified cell type not having achieved remission: Secondary | ICD-10-CM | POA: Diagnosis not present

## 2018-01-22 DIAGNOSIS — D709 Neutropenia, unspecified: Secondary | ICD-10-CM | POA: Diagnosis not present

## 2018-01-22 DIAGNOSIS — R5081 Fever presenting with conditions classified elsewhere: Secondary | ICD-10-CM | POA: Diagnosis not present

## 2018-01-22 DIAGNOSIS — R9431 Abnormal electrocardiogram [ECG] [EKG]: Secondary | ICD-10-CM | POA: Diagnosis not present

## 2018-01-22 DIAGNOSIS — C9200 Acute myeloblastic leukemia, not having achieved remission: Secondary | ICD-10-CM | POA: Diagnosis not present

## 2018-01-23 DIAGNOSIS — R5081 Fever presenting with conditions classified elsewhere: Secondary | ICD-10-CM | POA: Diagnosis not present

## 2018-01-23 DIAGNOSIS — D709 Neutropenia, unspecified: Secondary | ICD-10-CM | POA: Diagnosis not present

## 2018-01-23 DIAGNOSIS — C9200 Acute myeloblastic leukemia, not having achieved remission: Secondary | ICD-10-CM | POA: Diagnosis not present

## 2018-01-24 DIAGNOSIS — C9200 Acute myeloblastic leukemia, not having achieved remission: Secondary | ICD-10-CM | POA: Diagnosis not present

## 2018-01-24 DIAGNOSIS — R5081 Fever presenting with conditions classified elsewhere: Secondary | ICD-10-CM | POA: Diagnosis not present

## 2018-01-24 DIAGNOSIS — D709 Neutropenia, unspecified: Secondary | ICD-10-CM | POA: Diagnosis not present

## 2018-01-25 DIAGNOSIS — Z452 Encounter for adjustment and management of vascular access device: Secondary | ICD-10-CM | POA: Diagnosis not present

## 2018-01-25 DIAGNOSIS — D709 Neutropenia, unspecified: Secondary | ICD-10-CM | POA: Diagnosis not present

## 2018-01-25 DIAGNOSIS — Z856 Personal history of leukemia: Secondary | ICD-10-CM | POA: Diagnosis not present

## 2018-01-25 DIAGNOSIS — R5081 Fever presenting with conditions classified elsewhere: Secondary | ICD-10-CM | POA: Diagnosis not present

## 2018-01-25 DIAGNOSIS — C9200 Acute myeloblastic leukemia, not having achieved remission: Secondary | ICD-10-CM | POA: Diagnosis not present

## 2018-01-26 DIAGNOSIS — J9691 Respiratory failure, unspecified with hypoxia: Secondary | ICD-10-CM | POA: Diagnosis not present

## 2018-01-26 DIAGNOSIS — I509 Heart failure, unspecified: Secondary | ICD-10-CM | POA: Diagnosis not present

## 2018-01-26 DIAGNOSIS — C9200 Acute myeloblastic leukemia, not having achieved remission: Secondary | ICD-10-CM | POA: Diagnosis not present

## 2018-01-28 DIAGNOSIS — J9602 Acute respiratory failure with hypercapnia: Secondary | ICD-10-CM | POA: Diagnosis not present

## 2018-01-28 DIAGNOSIS — C9500 Acute leukemia of unspecified cell type not having achieved remission: Secondary | ICD-10-CM | POA: Diagnosis not present

## 2018-01-28 DIAGNOSIS — J9601 Acute respiratory failure with hypoxia: Secondary | ICD-10-CM | POA: Diagnosis not present

## 2018-01-28 DIAGNOSIS — C9200 Acute myeloblastic leukemia, not having achieved remission: Secondary | ICD-10-CM | POA: Diagnosis not present

## 2018-01-29 DIAGNOSIS — C9500 Acute leukemia of unspecified cell type not having achieved remission: Secondary | ICD-10-CM | POA: Diagnosis not present

## 2018-01-30 ENCOUNTER — Encounter: Payer: Self-pay | Admitting: Oncology

## 2018-01-30 ENCOUNTER — Other Ambulatory Visit: Payer: Self-pay

## 2018-01-30 ENCOUNTER — Inpatient Hospital Stay: Payer: 59 | Attending: Oncology | Admitting: Oncology

## 2018-01-30 VITALS — BP 94/57 | HR 93 | Temp 96.9°F | Ht 65.5 in | Wt 211.9 lb

## 2018-01-30 DIAGNOSIS — D709 Neutropenia, unspecified: Secondary | ICD-10-CM | POA: Diagnosis not present

## 2018-01-30 DIAGNOSIS — R6520 Severe sepsis without septic shock: Secondary | ICD-10-CM | POA: Diagnosis present

## 2018-01-30 DIAGNOSIS — C9200 Acute myeloblastic leukemia, not having achieved remission: Secondary | ICD-10-CM | POA: Diagnosis present

## 2018-01-30 DIAGNOSIS — Z9049 Acquired absence of other specified parts of digestive tract: Secondary | ICD-10-CM

## 2018-01-30 DIAGNOSIS — Z825 Family history of asthma and other chronic lower respiratory diseases: Secondary | ICD-10-CM

## 2018-01-30 DIAGNOSIS — D701 Agranulocytosis secondary to cancer chemotherapy: Secondary | ICD-10-CM | POA: Diagnosis not present

## 2018-01-30 DIAGNOSIS — D61810 Antineoplastic chemotherapy induced pancytopenia: Secondary | ICD-10-CM | POA: Diagnosis present

## 2018-01-30 DIAGNOSIS — I10 Essential (primary) hypertension: Secondary | ICD-10-CM | POA: Diagnosis present

## 2018-01-30 DIAGNOSIS — Z452 Encounter for adjustment and management of vascular access device: Secondary | ICD-10-CM | POA: Diagnosis not present

## 2018-01-30 DIAGNOSIS — E876 Hypokalemia: Secondary | ICD-10-CM | POA: Diagnosis present

## 2018-01-30 DIAGNOSIS — N179 Acute kidney failure, unspecified: Secondary | ICD-10-CM | POA: Diagnosis present

## 2018-01-30 DIAGNOSIS — K219 Gastro-esophageal reflux disease without esophagitis: Secondary | ICD-10-CM | POA: Diagnosis present

## 2018-01-30 DIAGNOSIS — T80219A Unspecified infection due to central venous catheter, initial encounter: Principal | ICD-10-CM | POA: Diagnosis present

## 2018-01-30 DIAGNOSIS — Z7189 Other specified counseling: Secondary | ICD-10-CM | POA: Insufficient documentation

## 2018-01-30 DIAGNOSIS — E861 Hypovolemia: Secondary | ICD-10-CM

## 2018-01-30 DIAGNOSIS — T451X5A Adverse effect of antineoplastic and immunosuppressive drugs, initial encounter: Secondary | ICD-10-CM | POA: Diagnosis present

## 2018-01-30 DIAGNOSIS — D6481 Anemia due to antineoplastic chemotherapy: Secondary | ICD-10-CM | POA: Insufficient documentation

## 2018-01-30 DIAGNOSIS — D696 Thrombocytopenia, unspecified: Secondary | ICD-10-CM | POA: Diagnosis not present

## 2018-01-30 DIAGNOSIS — I9589 Other hypotension: Secondary | ICD-10-CM

## 2018-01-30 DIAGNOSIS — F419 Anxiety disorder, unspecified: Secondary | ICD-10-CM | POA: Diagnosis present

## 2018-01-30 DIAGNOSIS — I427 Cardiomyopathy due to drug and external agent: Secondary | ICD-10-CM

## 2018-01-30 DIAGNOSIS — C9500 Acute leukemia of unspecified cell type not having achieved remission: Secondary | ICD-10-CM | POA: Diagnosis not present

## 2018-01-30 DIAGNOSIS — Y848 Other medical procedures as the cause of abnormal reaction of the patient, or of later complication, without mention of misadventure at the time of the procedure: Secondary | ICD-10-CM | POA: Diagnosis present

## 2018-01-30 NOTE — Progress Notes (Signed)
Patient here for initial visit. She got out of [facility] hospital this past Saturday.
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
## 2018-01-31 ENCOUNTER — Inpatient Hospital Stay: Payer: 59

## 2018-01-31 ENCOUNTER — Emergency Department: Payer: 59

## 2018-01-31 ENCOUNTER — Other Ambulatory Visit: Payer: Self-pay

## 2018-01-31 ENCOUNTER — Telehealth: Payer: Self-pay | Admitting: Oncology

## 2018-01-31 ENCOUNTER — Encounter: Payer: Self-pay | Admitting: Emergency Medicine

## 2018-01-31 ENCOUNTER — Inpatient Hospital Stay (HOSPITAL_BASED_OUTPATIENT_CLINIC_OR_DEPARTMENT_OTHER): Payer: 59 | Admitting: Oncology

## 2018-01-31 ENCOUNTER — Inpatient Hospital Stay
Admission: EM | Admit: 2018-01-31 | Discharge: 2018-02-03 | DRG: 314 | Disposition: A | Discharge status: Home / Self Care | Payer: 59 | Attending: Specialist | Admitting: Specialist

## 2018-01-31 VITALS — BP 85/61 | HR 78 | Temp 98.2°F | Resp 16

## 2018-01-31 DIAGNOSIS — T451X5A Adverse effect of antineoplastic and immunosuppressive drugs, initial encounter: Principal | ICD-10-CM

## 2018-01-31 DIAGNOSIS — C9200 Acute myeloblastic leukemia, not having achieved remission: Secondary | ICD-10-CM

## 2018-01-31 DIAGNOSIS — T80219A Unspecified infection due to central venous catheter, initial encounter: Secondary | ICD-10-CM

## 2018-01-31 DIAGNOSIS — E861 Hypovolemia: Secondary | ICD-10-CM

## 2018-01-31 DIAGNOSIS — D61818 Other pancytopenia: Secondary | ICD-10-CM | POA: Diagnosis present

## 2018-01-31 DIAGNOSIS — C9500 Acute leukemia of unspecified cell type not having achieved remission: Secondary | ICD-10-CM | POA: Diagnosis not present

## 2018-01-31 DIAGNOSIS — D709 Neutropenia, unspecified: Secondary | ICD-10-CM

## 2018-01-31 DIAGNOSIS — Z452 Encounter for adjustment and management of vascular access device: Secondary | ICD-10-CM | POA: Diagnosis not present

## 2018-01-31 DIAGNOSIS — I9589 Other hypotension: Secondary | ICD-10-CM

## 2018-01-31 DIAGNOSIS — D696 Thrombocytopenia, unspecified: Secondary | ICD-10-CM

## 2018-01-31 DIAGNOSIS — D701 Agranulocytosis secondary to cancer chemotherapy: Secondary | ICD-10-CM

## 2018-01-31 DIAGNOSIS — D6481 Anemia due to antineoplastic chemotherapy: Secondary | ICD-10-CM

## 2018-01-31 LAB — CBC WITH DIFFERENTIAL/PLATELET
Abs Immature Granulocytes: 0 10*3/uL (ref 0.00–0.07)
Abs Immature Granulocytes: 0.01 10*3/uL (ref 0.00–0.07)
Basophils Absolute: 0 10*3/uL (ref 0.0–0.1)
Basophils Absolute: 0 10*3/uL (ref 0.0–0.1)
Basophils Relative: 0 %
Basophils Relative: 0 %
Eosinophils Absolute: 0 10*3/uL (ref 0.0–0.5)
Eosinophils Absolute: 0 10*3/uL (ref 0.0–0.5)
Eosinophils Relative: 0 %
Eosinophils Relative: 0 %
HCT: 20.3 % — ABNORMAL LOW (ref 36.0–46.0)
HEMATOCRIT: 21.7 % — AB (ref 36.0–46.0)
Hemoglobin: 7.6 g/dL — ABNORMAL LOW (ref 12.0–15.0)
Hemoglobin: 6.7 g/dL — ABNORMAL LOW (ref 12.0–15.0)
Immature Granulocytes: 0 %
Immature Granulocytes: 1 %
Lymphocytes Relative: 39 %
Lymphocytes Relative: 42 %
Lymphs Abs: 0.4 10*3/uL — ABNORMAL LOW (ref 0.7–4.0)
Lymphs Abs: 0.4 10*3/uL — ABNORMAL LOW (ref 0.7–4.0)
MCH: 30.2 pg (ref 26.0–34.0)
MCH: 28.8 pg (ref 26.0–34.0)
MCHC: 35 g/dL (ref 30.0–36.0)
MCHC: 33 g/dL (ref 30.0–36.0)
MCV: 86.1 fL (ref 80.0–100.0)
MCV: 87.1 fL (ref 80.0–100.0)
Monocytes Absolute: 0.5 10*3/uL (ref 0.1–1.0)
Monocytes Absolute: 0.5 10*3/uL (ref 0.1–1.0)
Monocytes Relative: 57 %
Monocytes Relative: 55 %
NEUTROS PCT: 2 %
Neutro Abs: 0 10*3/uL — ABNORMAL LOW (ref 1.7–7.7)
Neutro Abs: 0 10*3/uL — ABNORMAL LOW (ref 1.7–7.7)
Neutrophils Relative %: 4 %
PLATELETS: 34 10*3/uL — AB (ref 150–400)
Platelets: 42 10*3/uL — ABNORMAL LOW (ref 150–400)
RBC: 2.52 MIL/uL — ABNORMAL LOW (ref 3.87–5.11)
RBC: 2.33 MIL/uL — ABNORMAL LOW (ref 3.87–5.11)
RDW: 13.2 % (ref 11.5–15.5)
RDW: 13.3 % (ref 11.5–15.5)
Smear Review: NORMAL
WBC: 0.9 10*3/uL — CL (ref 4.0–10.5)
WBC: 1 10*3/uL — CL (ref 4.0–10.5)
nRBC: 0 % (ref 0.0–0.2)
nRBC: 0 % (ref 0.0–0.2)

## 2018-01-31 LAB — COMPREHENSIVE METABOLIC PANEL
ALT: 68 U/L — ABNORMAL HIGH (ref 0–44)
ALT: 65 U/L — ABNORMAL HIGH (ref 0–44)
AST: 54 U/L — ABNORMAL HIGH (ref 15–41)
AST: 50 U/L — ABNORMAL HIGH (ref 15–41)
Albumin: 3.5 g/dL (ref 3.5–5.0)
Albumin: 3.2 g/dL — ABNORMAL LOW (ref 3.5–5.0)
Alkaline Phosphatase: 101 U/L (ref 38–126)
Alkaline Phosphatase: 95 U/L (ref 38–126)
Anion gap: 10 (ref 5–15)
Anion gap: 8 (ref 5–15)
BUN: 16 mg/dL (ref 6–20)
BUN: 16 mg/dL (ref 6–20)
CO2: 24 mmol/L (ref 22–32)
CO2: 25 mmol/L (ref 22–32)
Calcium: 9.1 mg/dL (ref 8.9–10.3)
Calcium: 8.8 mg/dL — ABNORMAL LOW (ref 8.9–10.3)
Chloride: 105 mmol/L (ref 98–111)
Chloride: 104 mmol/L (ref 98–111)
Creatinine, Ser: 1.45 mg/dL — ABNORMAL HIGH (ref 0.44–1.00)
Creatinine, Ser: 1.52 mg/dL — ABNORMAL HIGH (ref 0.44–1.00)
GFR calc Af Amer: 50 mL/min — ABNORMAL LOW (ref >60)
GFR calc Af Amer: 47 mL/min — ABNORMAL LOW (ref >60)
GFR calc non Af Amer: 43 mL/min — ABNORMAL LOW (ref >60)
GFR calc non Af Amer: 40 mL/min — ABNORMAL LOW (ref >60)
Glucose, Bld: 143 mg/dL — ABNORMAL HIGH (ref 70–99)
Glucose, Bld: 81 mg/dL (ref 70–99)
POTASSIUM: 3.5 mmol/L (ref 3.5–5.1)
POTASSIUM: 3.5 mmol/L (ref 3.5–5.1)
Sodium: 139 mmol/L (ref 135–145)
Sodium: 137 mmol/L (ref 135–145)
Total Bilirubin: 0.7 mg/dL (ref 0.3–1.2)
Total Bilirubin: 0.8 mg/dL (ref 0.3–1.2)
Total Protein: 7.3 g/dL (ref 6.5–8.1)
Total Protein: 6.8 g/dL (ref 6.5–8.1)

## 2018-01-31 LAB — BLOOD GAS, VENOUS
Acid-Base Excess: 1.5 mmol/L (ref 0.0–2.0)
Bicarbonate: 27.2 mmol/L (ref 20.0–28.0)
O2 Saturation: 60.1 %
Patient temperature: 37
pCO2, Ven: 46 mmHg (ref 44.0–60.0)
pH, Ven: 7.38 (ref 7.250–7.430)
pO2, Ven: 32 mmHg (ref 32.0–45.0)

## 2018-01-31 LAB — SAMPLE TO BLOOD BANK

## 2018-01-31 LAB — RETIC PANEL
Immature Retic Fract: 0 % — ABNORMAL LOW (ref 2.3–15.9)
RBC.: 2.52 MIL/uL — ABNORMAL LOW (ref 3.87–5.11)
RETIC COUNT ABSOLUTE: 1.8 10*3/uL — AB (ref 19.0–186.0)
Retic Ct Pct: 0.1 % — ABNORMAL LOW (ref 0.4–3.1)

## 2018-01-31 LAB — TROPONIN I: Troponin I: 0.05 ng/mL (ref <0.03)

## 2018-01-31 LAB — LACTIC ACID, PLASMA: Lactic Acid, Venous: 1.1 mmol/L (ref 0.5–1.9)

## 2018-01-31 MED ORDER — VANCOMYCIN HCL 10 G IV SOLR
1750.0000 mg | Freq: Once | INTRAVENOUS | Status: AC
  Administered 2018-01-31: 1750 mg via INTRAVENOUS
  Filled 2018-01-31: qty 1750

## 2018-01-31 MED ORDER — VANCOMYCIN HCL IN DEXTROSE 1-5 GM/200ML-% IV SOLN
1000.0000 mg | INTRAVENOUS | Status: DC
  Administered 2018-02-01: 1000 mg via INTRAVENOUS
  Filled 2018-01-31: qty 200

## 2018-01-31 MED ORDER — SODIUM CHLORIDE 0.9 % IV SOLN
2.0000 g | Freq: Two times a day (BID) | INTRAVENOUS | Status: DC
  Administered 2018-02-01 – 2018-02-02 (×3): 2 g via INTRAVENOUS
  Filled 2018-01-31 (×3): qty 2

## 2018-01-31 MED ORDER — VANCOMYCIN HCL IN DEXTROSE 1-5 GM/200ML-% IV SOLN
1000.0000 mg | Freq: Once | INTRAVENOUS | Status: DC
  Filled 2018-01-31: qty 200

## 2018-01-31 MED ORDER — SODIUM CHLORIDE 0.9 % IV SOLN
2.0000 g | Freq: Once | INTRAVENOUS | Status: AC
  Administered 2018-01-31: 2 g via INTRAVENOUS
  Filled 2018-01-31: qty 2

## 2018-01-31 MED ORDER — METRONIDAZOLE IN NACL 5-0.79 MG/ML-% IV SOLN
500.0000 mg | Freq: Three times a day (TID) | INTRAVENOUS | Status: DC
  Administered 2018-01-31 – 2018-02-03 (×9): 500 mg via INTRAVENOUS
  Filled 2018-01-31 (×11): qty 100

## 2018-01-31 MED ORDER — SODIUM CHLORIDE 0.9 % IV SOLN
1000.0000 mL | Freq: Once | INTRAVENOUS | Status: AC
  Administered 2018-01-31: 1000 mL via INTRAVENOUS

## 2018-01-31 NOTE — ED Provider Notes (Addendum)
[facility] Emergency Department Provider Note   ____________________________________________    I have reviewed the triage vital signs and the nursing notes.   HISTORY  Chief Complaint Blood Infection     HPI Rhonda Kane is a 48 y.o. female with a history of leukemia sent to the emergency department for evaluation of possible infected Hickman catheter.  Patient reports catheter was placed several days ago at [facility].  Seen by her oncologist here at [facility] yesterday noted to have erythema at the insertion site.  Also found to have low blood pressure.  Afebrile, no tachycardia.  Patient says she feels "weak ".  Sent to the ED for further evaluation by oncologist with recommendation for blood cultures, IV antibiotics and likely transfer to [facility] given history of neutropenia   Past Medical History:  Diagnosis Date  . Breast mass, right 2015  . Depression   . Dysmenorrhea   . GERD (gastroesophageal reflux disease)   . Headache    MIGRAINES  . Insomnia   . Insulin resistance   . Irregular menses   . Menometrorrhagia   . Migraine with aura   . PMS (premenstrual syndrome)   . PONV (postoperative nausea and vomiting)     Patient Active Problem List   Diagnosis Date Noted  . Neutropenia ([place])   . Symptomatic anemia 12/13/2017    Past Surgical History:  Procedure Laterality Date  . ANKLE ARTHROSCOPY Right 05/07/2015   Procedure: ANKLE ARTHROSCOPY  / WITH TENOLYSIS;  Surgeon: [name], DPM;  Location: [facility] ORS;  Service: Podiatry;  Laterality: Right;  right ankle arthroscopy with debridement, osteochondral repair, tenolysis of posterior tibial tendon  . BREAST FIBROADENOMA SURGERY Right 03/10/2013  . CARPAL TUNNEL RELEASE Left 09/11/2014   Procedure: CARPAL TUNNEL RELEASE;  Surgeon: Christophe Louis, MD;  Location: [facility] ORS;  Service: Orthopedics;  Laterality: Left;  . CHOLECYSTECTOMY      Prior to Admission medications   Medication Sig  Start Date End Date Taking? Authorizing Provider  ALPRAZolam Duanne Moron) 0.25 MG tablet Take 0.25 mg by mouth at bedtime as needed for anxiety.    [provider]  busPIRone (BUSPAR) 5 MG tablet Take 5 mg by mouth 2 (two) times daily. 12/11/17 12/11/18  [provider]  carvedilol (COREG) 6.25 MG tablet Take 6.25 mg by mouth 2 (two) times daily. 01/26/18   [provider]  folic acid (FOLVITE) 1 MG tablet Take 1 tablet (1 mg total) by mouth daily. 12/15/17 02/13/18  Henreitta Leber, MD  furosemide (LASIX) 20 MG tablet Take 20 mg by mouth daily. 01/26/18   [provider]  ibuprofen (ADVIL,MOTRIN) 600 MG tablet Take 600 mg by mouth daily as needed.    [provider]  levofloxacin (LEVAQUIN) 500 MG tablet Take 500 mg by mouth daily. 01/26/18   [provider]  losartan (COZAAR) 25 MG tablet Take 12.5 mg by mouth daily. 01/26/18   [provider]  meloxicam (MOBIC) 15 MG tablet Take 1 tablet (15 mg total) by mouth daily. 09/14/14   Hyatt, Max T, DPM  pantoprazole (PROTONIX) 40 MG tablet Take 40 mg by mouth daily. 01/26/18   [provider]  posaconazole (NOXAFIL) 100 MG TBEC delayed-release tablet Take 300 mg by mouth every 6 (six) hours as needed. 01/26/18   [provider]  traZODone (DESYREL) 50 MG tablet Take 50 mg by mouth daily as needed.    [provider]  valACYclovir (VALTREX) 500 MG tablet Take  500 mg by mouth daily. 01/26/18   [provider]  venlafaxine (EFFEXOR) 100 MG tablet Take 300 mg by mouth daily. 300 MG     [provider]     Allergies Patient has no known allergies.  Family History  Problem Relation Age of Onset  . COPD Mother   . COPD Father     Social History Social History   Tobacco Use  . Smoking status: Never Smoker  . Smokeless tobacco: Never Used  Substance Use Topics  . Alcohol use: No  . Drug use: No    Review of Systems  Constitutional: General  weakness Eyes: No visual changes.  ENT: No sore throat. Cardiovascular: Denies chest pain. Respiratory: Denies shortness of breath. Gastrointestinal: No abdominal pain.  No nausea, no vomiting.   Genitourinary: Negative for dysuria. Musculoskeletal: Negative for back pain. Skin: As above Neurological: Negative for headaches or weakness   ____________________________________________   PHYSICAL EXAM:  VITAL SIGNS: ED Triage Vitals  Enc Vitals Group     BP 01/31/18 1245 (!) 89/55     Pulse Rate 01/31/18 1243 81     Resp 01/31/18 1243 20     Temp 01/31/18 1243 98.2 F (36.8 C)     Temp Source 01/31/18 1243 Oral     SpO2 01/31/18 1243 100 %     Weight 01/31/18 1246 96.1 kg (211 lb 13.8 oz)     Height 01/31/18 1431 1.638 m (5' 4.5")     Head Circumference --      Peak Flow --      Pain Score 01/31/18 1246 0     Pain Loc --      Pain Edu? --      Excl. in [place]? --     Constitutional: Alert and oriented.  Eyes: Conjunctivae are normal.   Nose: No congestion/rhinnorhea. Mouth/Throat: Mucous membranes are moist.    Cardiovascular: Normal rate, regular rhythm. Grossly normal heart sounds.  Good peripheral circulation.  Significant erythema and possible purulent discharge at Hickman insertion site, right chest wall Respiratory: Normal respiratory effort.  No retractions. Lungs CTAB. Gastrointestinal: Soft and nontender. No distention.   Musculoskeletal: Warm and well perfused Neurologic:  Normal speech and language. No gross focal neurologic deficits are appreciated.  Skin:  Skin is warm, dry and intact.  Psychiatric: Mood and affect are normal. Speech and behavior are normal.  ____________________________________________   LABS (all labs ordered are listed, but only abnormal results are displayed)  Labs Reviewed  CBC WITH DIFFERENTIAL/PLATELET - Abnormal; Notable for the following components:      Result Value   WBC 1.0 (*)    RBC 2.33 (*)    Hemoglobin 6.7 (*)     HCT 20.3 (*)    Platelets 34 (*)    All other components within normal limits  CULTURE, BLOOD (ROUTINE X 2)  CULTURE, BLOOD (ROUTINE X 2)  URINE CULTURE  LACTIC ACID, PLASMA  BLOOD GAS, VENOUS  COMPREHENSIVE METABOLIC PANEL  TROPONIN I  URINALYSIS, COMPLETE (UACMP) WITH MICROSCOPIC   ____________________________________________  EKG  None ____________________________________________  RADIOLOGY  Chest x-ray unremarkable ____________________________________________   PROCEDURES  Procedure(s) performed: No  Procedures   Critical Care performed:yes  CRITICAL CARE Performed by: Lavonia Drafts   Total critical care time: 30 minutes  Critical care time was exclusive of separately billable procedures and treating other patients.  Critical care was necessary to treat or prevent imminent or life-threatening deterioration.  Critical care was time spent personally  by me on the following activities: development of treatment plan with patient and/or surrogate as well as nursing, discussions with consultants, evaluation of patient's response to treatment, examination of patient, obtaining history from patient or surrogate, ordering and performing treatments and interventions, ordering and review of laboratory studies, ordering and review of radiographic studies, pulse oximetry and re-evaluation of patient's condition.  ____________________________________________   INITIAL IMPRESSION / ASSESSMENT AND PLAN / ED COURSE  Pertinent labs & imaging results that were available during my care of the patient were reviewed by me and considered in my medical decision making (see chart for details).  Patient presents with likely line infection, blood pressures 89/56.  Labs drawn by oncology demonstrate the patient is neutropenic, we will give broad-spectrum antibiotics, IV fluids and arrange for transfer to [facility]  Discussed with Dr. Darden Palmer at [facility] of oncology, she accepts the patient in  transfer    ____________________________________________   FINAL CLINICAL IMPRESSION(S) / ED DIAGNOSES  Final diagnoses:  Neutropenia, unspecified type ([place])  Central line infection, initial encounter        Note:  This document was prepared using Dragon voice recognition software and may include unintentional dictation errors.   Lavonia Drafts, MD 01/31/18 1438    Lavonia Drafts, MD 02/15/18 1343

## 2018-01-31 NOTE — Telephone Encounter (Signed)
Patient is here for labs and recheck. Seen by NP Faythe Casa.  Blood pressure even lower, 84/40, diaphoretic. CBC showed thrombocytopenia with platelet count of 40,000, ANC 0, Hemoglobin 7.6. I am very concerned with her Hickman Catheter site and her low BP. likely sepsis.  [facility].Zeidner. Dr.Zeidner recommend patient to be admitted to [facility] given the complexity of her case[ AML, Neutropenia]. Patient was sent from our clinic to [facility] ED. Discussed case with ED physician Dr.Williams. I recommend stat blood culture, IV fluid, and a dose of Vancomycin. Once patient's vital sign is stabilized, she should be sent from [facility] ED to [facility] ED for further evaluation.

## 2018-01-31 NOTE — ED Notes (Signed)
Patient requesting to see Md, as per husband request. Would like some information on transfer status.

## 2018-01-31 NOTE — ED Notes (Signed)
Patient placed on house bed. Boarding until morning. Family at bedside.

## 2018-01-31 NOTE — ED Notes (Signed)
Patient denies any pain or discomforts att present time. Requesting something to eat. Awaiting transfer to [facility]. Vss. Patient afebrile. vanco infusing through rac, area cdi. Will monitor.

## 2018-01-31 NOTE — ED Triage Notes (Signed)
Arrives from [facility] for ED evaluation and transfer to [facility].  Hickman was placed last Friday according to patient.  Patient needs blood cultures and Vancomycin according to report from [facility]>

## 2018-01-31 NOTE — ED Notes (Signed)
[place]  CALLED  PER  DR  Corky Downs

## 2018-01-31 NOTE — Progress Notes (Signed)
 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 12/26/17-01/24/18.   Oncology History: Patient was seen by Dr. Yu on 12/14/2017 during her admission at [facility], at that time, patient was having symptoms of generalized weakness, shortness of breath with exertion.  She was found to have a hemoglobin of 5 with MCV of 116. Iron panel showed saturation 52, ferritin 125, B12 336, TSH normal.  Peripheral smear blood showed RBCs with teardrop cells and rare nucleated RBC.  No morphologic changes to indicate megaloblastic anemia. Mild neutropenia with ANC of 1.5.  20% leukocytes are abnormal.  Medium to large cells with scant cytoplasm, round nuclear and smooth chromatin pattern.  Some of the large cell displayed blast-like appearance. Peripheral blood flow cytometry came back positive for 26% myeloblasts, suspect AML.  I called patient and advised patient to go to tertiary center.  Also discussed with Dr. Coombs at [facility] about her case.  #Patient went to [facility] was admitted to leukemia service.  Extensive medical records review was performed.  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  Bone marrow biopsy 1226 showed 30% cellularity with 80% blast. 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  Patient was requiring pressor to maintain blood pressure and was intubated as well.  . TTE demonstrated  newly reduced LVEF 40% from previous baseline 55%. Cardiology was consulted and recommended carvedilol, losartan, and lasix prn.  The acute episodes resolved quickly and once stabilized, given that she has increased circulating blast and not being a candidate for further anthracycline, she was started on decitabine from 1/2/202 to 01/26/2018 [per note, 20 mg/m IV for 10 days]  # Neutropenic fever: New fever to 39.5 on 12/17 on D7 of CPX-351. ANC 0.3 at that time. Fevers persisted until 12/24. Infectious workup was negative. She was initially started on cefepime and vancomycin in addition to posaconazole and valtrex prophylaxis. These were deescalated as cultures returned negative and as she was no longer fevering. Briefly broadened back with episode of acute hypoxic respiratory failure, but quickly deescalated and she completed a week-long course of cefepime for possible HAP. Antibiotic course consisted of: cefepime(12/17-12/19, 1/1-1/6), meropenem (12/20-12/24, 12/28-12/30), vancomycin (12/17-12/22, 12/28-12/29), zosyn (12/24-12/28, 12/31-1/1). Was discharged with posaconazole, levofloxacin, and valtrex prophylaxis   #Vaginal bleeding.  Patient has a history of heavy menstrual cycles which were improved after placement of Mirena IUD.  She developed new vaginal bleeding which started on 1221 in the setting of thrombocytopenia.  Medroxyprogesterone was started to help mitigate this bleeding however she continued to have low volume vaginal bleeding requiring multiple pad changes daily.  Medroxyprogesterone dose was decreased from 20 mg twice daily to 10 mg daily on 02/14/2018.  This decreased unfortunately resulted the triggering menstrual cycle.   [facility] leukemia team discussed with gynecology, Provera was discontinued.  Given her anemia and thrombocytopenia, patient need frequent lab draws and supportive care.  Patient prefers to establish outpatient follow-up with me locally for frequent laboratory and  supportive care, plus minus chemotherapy in the future.  #Patient   has triple-lumen Hickman catheter placed on her right anterior chest wall with mild erythema.  Denies any tenderness around the site or pus discharge.  Denies any fever or chills.  Subjective Data:  ECOG: 1 - Symptomatic but completely ambulatory   Subjective:     Rhonda Kane is a 48 y.o. female who presents for evaluation of symptoms of an infected recently placed Hickmans cathater. Symptoms include purulent and bloody discharge. Onset of symptoms was 2 days ago, and has been rapidly worsening since that time. Treatment to date: none.  The following portions of the patient's history were reviewed and updated as appropriate: allergies, current medications, past family history, past medical history, past social history, past surgical history and problem list.  Review of Systems A comprehensive review of systems was negative except for: Constitutional: positive for fatigue and malaise Cardiovascular: positive for fatigue and Hypotension Integument/breast: positive for Right Hickman catheter infection; positive for milky purulent drainage and blood. Musculoskeletal: positive for muscle weakness and myalgias    Objective:    BP (!) 85/61 (BP Location: Left Arm, Patient Position: Sitting) Comment: repeat bp 89/62; unable to obtain standing bp  Pulse 78   Temp 98.2 F (36.8 C) (Tympanic)   Resp 16  General appearance: fatigued, mild distress and pale Skin: Purulent and bloody drainage surrounding recently placed Hickmans cath Neurologic: Alert and oriented X 3, normal strength and tone. Normal symmetric reflexes. Normal coordination and gait   Assessment:    Infected Hickman's catheter.  Plan:    Patient presents to [facility] for evaluation of increased drainage from recently placed Hickman's catheter at [facility].  Acute myeloid leukemia: Recently diagnosed.  Treated at [facility] with induction chemotherapy.  Developed  anthracycline induced cardiomyopathy, acute hypercapnic hypoxemic respiratory failure and hypotension secondary to flash pulmonary edema requiring pressors and intubation.  Once stabilized she then began treatment with decitabine only.  Appears to be tolerating decitabine well. She was started on Lasix, losartan and Coreg for cardiomyopathy.  She was scheduled to return to [facility] biweekly for labs and as needed blood transfusions.  Grade 4 neutropenia: On prophylactic dosing of  Levaquin 500 mg daily, Valtrex 500 mg daily and posaconazole 300 mg daily.  She is afebrile in clinic.  ANC 0.  Infected Hickman catheter /possible sepsis :She was seen by Dr. Yu yesterday to establish care locally for blood work and blood transfusions PRN.  Hickman catheter site appeared to have more drainage (bloody) but patient was unclear it it appeared worse than when evaluated at [facility] on Monday.  Picture uploaded into Epic.  Today, patient's drainage has increased and is leaking from underneath dressing.  Site is extremely erythematous, edematous and tender to touch.  Plan: Stat vital signs. Stat labs. Start fluids ASAP.  Stat blood cultures. We will call [facility] and speak to on-call hematology oncology physician.  Given patient is being treated at [facility] for AML, will get their opinion on if she should be transferred to [facility] instead of being admitted at [facility].  Vital signs show hypotension with a blood pressure of 85/61.  She is afebrile.  She appears diaphoretic on examination.  All intentions to keep patient and directly admit her if needed but given assessment, will transfer her to our emergency room so that she may be transferred to [facility] emergency room ASAP.  On-call physician at [facility] recommends IV fluids, IV vancomycin and blood cultures.  Report called to Jane, RN in the emergency room.  Greater than 50% was spent in   counseling and coordination of care with this patient including but not limited to discussion of the relevant  topics above (See A&P) including, but not limited to diagnosis and management of acute and chronic medical conditions.   Faythe Casa, NP 02/01/2018 1:43 PM

## 2018-01-31 NOTE — Progress Notes (Signed)
CODE SEPSIS - PHARMACY COMMUNICATION  **Broad Spectrum Antibiotics should be administered within 1 hour of Sepsis diagnosis**  Time Code Sepsis Called/Page Received: 0037  Antibiotics Ordered: cefepime, metronidazole, vancomycin  Time of 1st antibiotic administration: 1320  Additional action taken by pharmacy: n/a  If necessary, Name of Provider/Nurse Contacted: [place] ,PharmD Clinical Pharmacist  01/31/2018  12:54 PM

## 2018-01-31 NOTE — ED Notes (Signed)
Patient sleeping comfortably. Family went home will be back in the morning.

## 2018-01-31 NOTE — Consult Note (Signed)
Pharmacy Antibiotic Note  Rhonda Kane is a 48 y.o. female admitted on 01/31/2018 with infection of unknown source.  Pharmacy has been consulted for cefepime and vancomycin dosing.  Plan: Cefepime 2 gm IV every 12 hours  Vancomycin load: Vancomycin 1750 mg IV once Vancomycin 1000 mg IV Q 24 hrs. Goal AUC 400-550. Expected AUC: 551.7 SCr used: 1.52  Weight: 211 lb 13.8 oz (96.1 kg)  Temp (24hrs), Avg:98.2 F (36.8 C), Min:98.2 F (36.8 C), Max:98.2 F (36.8 C)  Recent Labs  Lab 01/31/18 1130  WBC 0.9*  CREATININE 1.45*    Estimated Creatinine Clearance: 55.6 mL/min (A) (by C-G formula based on SCr of 1.45 mg/dL (H)).    No Known Allergies  Antimicrobials this admission: Cefepime 1/16 >>  Vanco 1/16 >>  Metronidazole 1/16 >>  Dose adjustments this admission:   Microbiology results: 1/16 BCx: pending collection 1/16 UCx: pending collection   Thank you for allowing pharmacy to be a part of this patient's care.  Forrest Moron, PharmD Clinical Pharmacist 01/31/2018 1:02 PM

## 2018-01-31 NOTE — ED Notes (Signed)
SPOKE  [place]  PT  STILL ON  WAITLIST  FOR  BED  INFORMED   RN Tomasa Hosteller  AND  DR  Kerman Passey MD

## 2018-02-01 ENCOUNTER — Telehealth: Payer: Self-pay | Admitting: *Deleted

## 2018-02-01 DIAGNOSIS — D649 Anemia, unspecified: Secondary | ICD-10-CM | POA: Diagnosis not present

## 2018-02-01 DIAGNOSIS — C9200 Acute myeloblastic leukemia, not having achieved remission: Secondary | ICD-10-CM | POA: Diagnosis not present

## 2018-02-01 DIAGNOSIS — D696 Thrombocytopenia, unspecified: Secondary | ICD-10-CM | POA: Insufficient documentation

## 2018-02-01 DIAGNOSIS — C9500 Acute leukemia of unspecified cell type not having achieved remission: Secondary | ICD-10-CM | POA: Diagnosis not present

## 2018-02-01 LAB — CBC WITH DIFFERENTIAL/PLATELET
Abs Immature Granulocytes: 0 10*3/uL (ref 0.00–0.07)
BASOS PCT: 0 %
Basophils Absolute: 0 10*3/uL (ref 0.0–0.1)
EOS ABS: 0 10*3/uL (ref 0.0–0.5)
Eosinophils Relative: 0 %
HCT: 19.9 % — ABNORMAL LOW (ref 36.0–46.0)
Hemoglobin: 6.7 g/dL — ABNORMAL LOW (ref 12.0–15.0)
Immature Granulocytes: 0 %
Lymphocytes Relative: 52 %
Lymphs Abs: 0.5 10*3/uL — ABNORMAL LOW (ref 0.7–4.0)
MCH: 29.6 pg (ref 26.0–34.0)
MCHC: 33.7 g/dL (ref 30.0–36.0)
MCV: 88.1 fL (ref 80.0–100.0)
Monocytes Absolute: 0.4 10*3/uL (ref 0.1–1.0)
Monocytes Relative: 45 %
NEUTROS ABS: 0 10*3/uL — AB (ref 1.7–7.7)
NEUTROS PCT: 3 %
Platelets: 19 10*3/uL — CL (ref 150–400)
RBC: 2.26 MIL/uL — ABNORMAL LOW (ref 3.87–5.11)
RDW: 13.2 % (ref 11.5–15.5)
Smear Review: DECREASED
WBC: 1 10*3/uL — CL (ref 4.0–10.5)
nRBC: 0 % (ref 0.0–0.2)

## 2018-02-01 LAB — COMPREHENSIVE METABOLIC PANEL
ALT: 56 U/L — ABNORMAL HIGH (ref 0–44)
AST: 39 U/L (ref 15–41)
Albumin: 3.1 g/dL — ABNORMAL LOW (ref 3.5–5.0)
Alkaline Phosphatase: 89 U/L (ref 38–126)
Anion gap: 8 (ref 5–15)
BUN: 12 mg/dL (ref 6–20)
CALCIUM: 8.6 mg/dL — AB (ref 8.9–10.3)
CO2: 24 mmol/L (ref 22–32)
CREATININE: 1.28 mg/dL — AB (ref 0.44–1.00)
Chloride: 107 mmol/L (ref 98–111)
GFR calc Af Amer: 58 mL/min — ABNORMAL LOW (ref >60)
GFR calc non Af Amer: 50 mL/min — ABNORMAL LOW (ref >60)
Glucose, Bld: 106 mg/dL — ABNORMAL HIGH (ref 70–99)
Potassium: 3.4 mmol/L — ABNORMAL LOW (ref 3.5–5.1)
Sodium: 139 mmol/L (ref 135–145)
Total Bilirubin: 0.9 mg/dL (ref 0.3–1.2)
Total Protein: 6.5 g/dL (ref 6.5–8.1)

## 2018-02-01 LAB — URINALYSIS, COMPLETE (UACMP) WITH MICROSCOPIC
Bilirubin Urine: NEGATIVE
Glucose, UA: NEGATIVE mg/dL
KETONES UR: NEGATIVE mg/dL
Leukocytes, UA: NEGATIVE
Nitrite: NEGATIVE
Protein, ur: 30 mg/dL — AB
RBC / HPF: >50 RBC/hpf — ABNORMAL HIGH (ref 0–5)
Specific Gravity, Urine: 1.009 (ref 1.005–1.030)
WBC, UA: NONE SEEN WBC/hpf (ref 0–5)
pH: 6 (ref 5.0–8.0)

## 2018-02-01 LAB — MAGNESIUM: Magnesium: 1.6 mg/dL — ABNORMAL LOW (ref 1.7–2.4)

## 2018-02-01 MED ORDER — PROCHLORPERAZINE EDISYLATE 10 MG/2ML IJ SOLN
10.0000 mg | INTRAMUSCULAR | Status: DC | PRN
  Filled 2018-02-01 (×2): qty 2

## 2018-02-01 MED ORDER — VALACYCLOVIR HCL 500 MG PO TABS
500.0000 mg | ORAL_TABLET | Freq: Every day | ORAL | Status: DC
  Administered 2018-02-01 – 2018-02-03 (×3): 500 mg via ORAL
  Filled 2018-02-01 (×3): qty 1

## 2018-02-01 MED ORDER — CARVEDILOL 3.125 MG PO TABS
6.2500 mg | ORAL_TABLET | Freq: Two times a day (BID) | ORAL | Status: DC
  Administered 2018-02-01 – 2018-02-03 (×4): 6.25 mg via ORAL
  Filled 2018-02-01: qty 2
  Filled 2018-02-01: qty 1
  Filled 2018-02-01: qty 2
  Filled 2018-02-01: qty 1

## 2018-02-01 MED ORDER — PANTOPRAZOLE SODIUM 40 MG PO TBEC
40.0000 mg | DELAYED_RELEASE_TABLET | Freq: Every day | ORAL | Status: DC
  Administered 2018-02-01 – 2018-02-03 (×3): 40 mg via ORAL
  Filled 2018-02-01 (×3): qty 1

## 2018-02-01 MED ORDER — BUSPIRONE HCL 5 MG PO TABS
5.0000 mg | ORAL_TABLET | Freq: Two times a day (BID) | ORAL | Status: DC
  Administered 2018-02-01 – 2018-02-03 (×4): 5 mg via ORAL
  Filled 2018-02-01 (×5): qty 1

## 2018-02-01 MED ORDER — LOSARTAN POTASSIUM 25 MG PO TABS
12.5000 mg | ORAL_TABLET | Freq: Every day | ORAL | Status: DC
  Administered 2018-02-01 – 2018-02-03 (×3): 12.5 mg via ORAL
  Filled 2018-02-01: qty 1
  Filled 2018-02-01 (×2): qty 0.5

## 2018-02-01 MED ORDER — TRAZODONE HCL 50 MG PO TABS
50.0000 mg | ORAL_TABLET | Freq: Every evening | ORAL | Status: DC | PRN
  Administered 2018-02-01 – 2018-02-02 (×2): 50 mg via ORAL
  Filled 2018-02-01 (×2): qty 1

## 2018-02-01 MED ORDER — VENLAFAXINE HCL ER 75 MG PO CP24
300.0000 mg | ORAL_CAPSULE | Freq: Every day | ORAL | Status: DC
  Administered 2018-02-02 – 2018-02-03 (×2): 300 mg via ORAL
  Filled 2018-02-01: qty 4
  Filled 2018-02-01: qty 2

## 2018-02-01 MED ORDER — VENLAFAXINE HCL 37.5 MG PO TABS: 300.0000 mg | ORAL_TABLET | Freq: Every day | ORAL | Status: DC

## 2018-02-01 NOTE — ED Notes (Signed)
Pt sleeping. No needs.

## 2018-02-01 NOTE — ED Notes (Signed)
Meal tray ordered for pt  

## 2018-02-01 NOTE — Telephone Encounter (Signed)
Patient husband called reporting that patient is still in ER at [facility] and has not been transferred to [facility]. He would [facility] for Dr Tasia Catchings to call him

## 2018-02-01 NOTE — ED Notes (Signed)
SPOKE  [place] ON  WAIT LIST FOR  BED INFORMED  RN  Mateo Flow  AND  DR  Clearnce Hasten  MD

## 2018-02-01 NOTE — ED Notes (Signed)
Pt given applesauce. Husband at bedside.

## 2018-02-01 NOTE — ED Notes (Signed)
TRUDI cleaning room 6 att for pt to transfer, family notified

## 2018-02-01 NOTE — ED Notes (Signed)
Pt given feminine pad. NAD. Hickman site red. Remains to have CHG dressing intact.  Dried blood around site.  Pt is alert and oriented. Unlabored. Waiting on transfer to [facility].

## 2018-02-01 NOTE — ED Notes (Signed)
Pt given meal tray.

## 2018-02-01 NOTE — ED Notes (Signed)
CRITICAL LAB: WBC is 1.0, PLATELET 19, [place], Lab, Dr. Mable Paris notified, orders received

## 2018-02-01 NOTE — ED Notes (Signed)
Updated patient and family on plan of care to move to new room once cleaned and UV disinfected.

## 2018-02-01 NOTE — ED Notes (Signed)
Called [facility] transfer Center spoke to [place], patient on waiting list [facility] still at capacity informed RN and MD [phone]

## 2018-02-01 NOTE — ED Notes (Signed)
Pt given [place] sandwich. NAD. No other needs.

## 2018-02-01 NOTE — ED Notes (Signed)
Called [facility] transfer Center spoke to [place] on status, she stated they were at capacity and she is on a list.  2216

## 2018-02-01 NOTE — ED Provider Notes (Signed)
Signout from Dr. Karma Greaser in this 48 year old female with neutropenic fever secondary to leukemia.  Pending transfer to [facility].  Physical Exam  BP (!) 132/94   Pulse 92   Temp 98.3 F (36.8 C) (Oral)   Resp 15   Ht 5' 4.5" (1.638 m)   Wt 95.7 kg   SpO2 99%   BMI 35.66 kg/m   Physical Exam  ED Course/Procedures     Procedures  MDM  ----------------------------------------- 3:26 PM on 02/01/2018 -----------------------------------------  Discussed the case with Dr. Yves Dill of [facility] oncology.  No further recommendations for patient's care at this time.  Also discussed with [facility] transfer center who states that the patient should be able to be transferred in the next several hours.  Patient aware of updated plan.  Patient without any distress at this time.  Patient has not had further hypotension or fever.      Orbie Pyo, MD 02/01/18 [phone]

## 2018-02-01 NOTE — ED Notes (Signed)
Family remains in room. Waiting on oncology consult.

## 2018-02-01 NOTE — ED Notes (Signed)
This RN called and spoke with [facility] transfer center.  They informed RN that all discharge beds were filled with internal transfers to unit and patient remains on waiting list.  Dr Joni Fears remains aware.

## 2018-02-01 NOTE — ED Notes (Signed)
Both IV flushed with 5 ML NS
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
## 2018-02-01 NOTE — ED Notes (Signed)
SPOKE  WITH  [facility]  TRANSFER  CENTER PT  STILL ON  WAIT LIST  FOR  BED INFORMED  RN  Mateo Flow  AND  DR  Clearnce Hasten

## 2018-02-02 ENCOUNTER — Other Ambulatory Visit: Payer: Self-pay

## 2018-02-02 DIAGNOSIS — D61810 Antineoplastic chemotherapy induced pancytopenia: Secondary | ICD-10-CM | POA: Diagnosis present

## 2018-02-02 DIAGNOSIS — R6520 Severe sepsis without septic shock: Secondary | ICD-10-CM | POA: Diagnosis not present

## 2018-02-02 DIAGNOSIS — Z9049 Acquired absence of other specified parts of digestive tract: Secondary | ICD-10-CM | POA: Diagnosis not present

## 2018-02-02 DIAGNOSIS — N179 Acute kidney failure, unspecified: Secondary | ICD-10-CM | POA: Diagnosis not present

## 2018-02-02 DIAGNOSIS — E876 Hypokalemia: Secondary | ICD-10-CM | POA: Diagnosis present

## 2018-02-02 DIAGNOSIS — Z825 Family history of asthma and other chronic lower respiratory diseases: Secondary | ICD-10-CM | POA: Diagnosis not present

## 2018-02-02 DIAGNOSIS — F419 Anxiety disorder, unspecified: Secondary | ICD-10-CM | POA: Diagnosis present

## 2018-02-02 DIAGNOSIS — T80219A Unspecified infection due to central venous catheter, initial encounter: Secondary | ICD-10-CM | POA: Diagnosis not present

## 2018-02-02 DIAGNOSIS — D61818 Other pancytopenia: Secondary | ICD-10-CM | POA: Diagnosis not present

## 2018-02-02 DIAGNOSIS — A419 Sepsis, unspecified organism: Secondary | ICD-10-CM | POA: Diagnosis not present

## 2018-02-02 DIAGNOSIS — I10 Essential (primary) hypertension: Secondary | ICD-10-CM | POA: Diagnosis present

## 2018-02-02 DIAGNOSIS — K219 Gastro-esophageal reflux disease without esophagitis: Secondary | ICD-10-CM | POA: Diagnosis present

## 2018-02-02 DIAGNOSIS — C9500 Acute leukemia of unspecified cell type not having achieved remission: Secondary | ICD-10-CM | POA: Diagnosis not present

## 2018-02-02 DIAGNOSIS — Y848 Other medical procedures as the cause of abnormal reaction of the patient, or of later complication, without mention of misadventure at the time of the procedure: Secondary | ICD-10-CM | POA: Diagnosis present

## 2018-02-02 DIAGNOSIS — C9200 Acute myeloblastic leukemia, not having achieved remission: Secondary | ICD-10-CM

## 2018-02-02 DIAGNOSIS — T451X5A Adverse effect of antineoplastic and immunosuppressive drugs, initial encounter: Secondary | ICD-10-CM | POA: Diagnosis present

## 2018-02-02 LAB — BASIC METABOLIC PANEL
Anion gap: 6 (ref 5–15)
BUN: 14 mg/dL (ref 6–20)
CO2: 23 mmol/L (ref 22–32)
Calcium: 8.2 mg/dL — ABNORMAL LOW (ref 8.9–10.3)
Chloride: 110 mmol/L (ref 98–111)
Creatinine, Ser: 1.31 mg/dL — ABNORMAL HIGH (ref 0.44–1.00)
GFR calc Af Amer: 56 mL/min — ABNORMAL LOW (ref >60)
GFR calc non Af Amer: 48 mL/min — ABNORMAL LOW (ref >60)
Glucose, Bld: 145 mg/dL — ABNORMAL HIGH (ref 70–99)
Potassium: 3.2 mmol/L — ABNORMAL LOW (ref 3.5–5.1)
Sodium: 139 mmol/L (ref 135–145)

## 2018-02-02 LAB — CBC
HCT: 16.4 % — ABNORMAL LOW (ref 36.0–46.0)
Hemoglobin: 5.5 g/dL — ABNORMAL LOW (ref 12.0–15.0)
MCH: 29.4 pg (ref 26.0–34.0)
MCHC: 33.5 g/dL (ref 30.0–36.0)
MCV: 87.7 fL (ref 80.0–100.0)
PLATELETS: 15 10*3/uL — AB (ref 150–400)
RBC: 1.87 MIL/uL — ABNORMAL LOW (ref 3.87–5.11)
RDW: 13.1 % (ref 11.5–15.5)
WBC: 0.8 10*3/uL — CL (ref 4.0–10.5)

## 2018-02-02 LAB — URINE CULTURE

## 2018-02-02 MED ORDER — ALBUTEROL SULFATE (2.5 MG/3ML) 0.083% IN NEBU: 2.5000 mg | INHALATION_SOLUTION | RESPIRATORY_TRACT | Status: DC | PRN

## 2018-02-02 MED ORDER — VANCOMYCIN HCL 10 G IV SOLR
1250.0000 mg | INTRAVENOUS | Status: DC
  Administered 2018-02-02: 1250 mg via INTRAVENOUS
  Filled 2018-02-02 (×2): qty 1250

## 2018-02-02 MED ORDER — ACETAMINOPHEN 325 MG PO TABS
650.0000 mg | ORAL_TABLET | Freq: Once | ORAL | Status: AC
  Administered 2018-02-02: 23:00:00 650 mg via ORAL
  Filled 2018-02-02: qty 2

## 2018-02-02 MED ORDER — DIPHENHYDRAMINE HCL 50 MG/ML IJ SOLN
25.0000 mg | Freq: Once | INTRAMUSCULAR | Status: AC
  Administered 2018-02-02: 23:00:00 25 mg via INTRAVENOUS
  Filled 2018-02-02: qty 0.5

## 2018-02-02 MED ORDER — ACETAMINOPHEN 325 MG PO TABS: 650.0000 mg | ORAL_TABLET | Freq: Once | ORAL | Status: DC

## 2018-02-02 MED ORDER — ONDANSETRON HCL 4 MG/2ML IJ SOLN: 4.0000 mg | Freq: Four times a day (QID) | INTRAMUSCULAR | Status: DC | PRN

## 2018-02-02 MED ORDER — FUROSEMIDE 10 MG/ML IJ SOLN
20.0000 mg | Freq: Once | INTRAMUSCULAR | Status: DC
  Administered 2018-02-03: 03:00:00 20 mg via INTRAVENOUS

## 2018-02-02 MED ORDER — SENNOSIDES-DOCUSATE SODIUM 8.6-50 MG PO TABS: 1.0000 | ORAL_TABLET | Freq: Every evening | ORAL | Status: DC | PRN

## 2018-02-02 MED ORDER — SODIUM CHLORIDE 0.9% IV SOLUTION
Freq: Once | INTRAVENOUS | Status: AC
  Administered 2018-02-02 – 2018-02-03 (×2): via INTRAVENOUS
  Filled 2018-02-02: qty 250

## 2018-02-02 MED ORDER — ACETAMINOPHEN 325 MG PO TABS: 650.0000 mg | ORAL_TABLET | Freq: Four times a day (QID) | ORAL | Status: DC | PRN

## 2018-02-02 MED ORDER — SODIUM CHLORIDE 0.9% FLUSH: 3.0000 mL | INTRAVENOUS | Status: DC | PRN

## 2018-02-02 MED ORDER — SODIUM CHLORIDE 0.9% IV SOLUTION
Freq: Once | INTRAVENOUS | Status: AC
  Administered 2018-02-02: via INTRAVENOUS

## 2018-02-02 MED ORDER — SODIUM CHLORIDE 0.9 % IV SOLN
250.0000 mL | INTRAVENOUS | Status: DC | PRN
  Administered 2018-02-02 – 2018-02-03 (×4): 250 mL via INTRAVENOUS

## 2018-02-02 MED ORDER — VANCOMYCIN HCL IN DEXTROSE 1-5 GM/200ML-% IV SOLN: 1000.0000 mg | INTRAVENOUS | Status: DC

## 2018-02-02 MED ORDER — BISACODYL 5 MG PO TBEC: 5.0000 mg | DELAYED_RELEASE_TABLET | Freq: Every day | ORAL | Status: DC | PRN

## 2018-02-02 MED ORDER — ACETAMINOPHEN 650 MG RE SUPP: 650.0000 mg | Freq: Four times a day (QID) | RECTAL | Status: DC | PRN

## 2018-02-02 MED ORDER — ONDANSETRON HCL 4 MG PO TABS: 4.0000 mg | ORAL_TABLET | Freq: Four times a day (QID) | ORAL | Status: DC | PRN

## 2018-02-02 MED ORDER — SODIUM CHLORIDE 0.9% FLUSH
3.0000 mL | Freq: Two times a day (BID) | INTRAVENOUS | Status: DC
  Administered 2018-02-02: 21:00:00 3 mL via INTRAVENOUS

## 2018-02-02 MED ORDER — HYDROCODONE-ACETAMINOPHEN 5-325 MG PO TABS: 1.0000 | ORAL_TABLET | ORAL | Status: DC | PRN

## 2018-02-02 MED ORDER — SODIUM CHLORIDE 0.9 % IV SOLN
2.0000 g | Freq: Three times a day (TID) | INTRAVENOUS | Status: DC
  Administered 2018-02-02 – 2018-02-03 (×3): 2 g via INTRAVENOUS
  Filled 2018-02-02 (×6): qty 2

## 2018-02-02 MED ORDER — FUROSEMIDE 10 MG/ML IJ SOLN
20.0000 mg | Freq: Once | INTRAMUSCULAR | Status: AC
  Administered 2018-02-02: 20 mg via INTRAVENOUS
  Filled 2018-02-02: qty 2

## 2018-02-02 MED ORDER — ACETAMINOPHEN 500 MG PO TABS
1000.0000 mg | ORAL_TABLET | ORAL | Status: AC
  Administered 2018-02-02: 1000 mg via ORAL
  Filled 2018-02-02: qty 2

## 2018-02-02 NOTE — Consult Note (Signed)
Pharmacy Antibiotic Note  Rhonda Kane is a 48 y.o. female admitted on 01/31/2018 with febrile neutropenia  Pharmacy has been consulted for cefepime and vancomycin dosing. She is waiting for a transfer to [facility] for treatment. Since admission her SCr has improved to what appears to be her baseline.  Plan: 1) increase cefepime to 2 gm IV every 8 hours  2) increase vancomycin to 1250 mg IV Q 24 hrs.  Goal AUC 400-600 Expected AUC: 580.8 SCr used: 1.28  Height: 5' 4.5" (163.8 cm) Weight: 211 lb (95.7 kg) IBW/kg (Calculated) : 55.85  Temp (24hrs), Avg:98.4 F (36.9 C), Min:98.2 F (36.8 C), Max:98.8 F (37.1 C)  Recent Labs  Lab 01/31/18 1130 01/31/18 1344 02/01/18 2211  WBC 0.9* 1.0* 1.0*  CREATININE 1.45* 1.52* 1.28*  LATICACIDVEN  --  1.1  --     Estimated Creatinine Clearance: 61.6 mL/min (A) (by C-G formula based on SCr of 1.28 mg/dL (H)).    No Known Allergies  Antimicrobials this admission: Cefepime 1/16 >>  Vanco 1/16 >>  Metronidazole 1/16 >>  Dose adjustments this admission: 1/18 vancomycin 1000 mg q24h----> 1250 mg q 24h  Microbiology results: 1/16 BCx: pending  1/16 UCx: multiple sp; recollect suggested   Thank you for allowing pharmacy to be a part of this patient's care.  Dallie Piles, PharmD Clinical Pharmacist 02/02/2018 12:33 PM

## 2018-02-02 NOTE — ED Notes (Signed)
PT resting quietly in bed, family ar bedside, NAD noted, VSS, pt voices no needs or c/o at this time.  New POC, pt aware of same, awaiting inpt admission at this time.

## 2018-02-02 NOTE — ED Provider Notes (Signed)
Vitals:   02/01/18 2130 02/02/18 0146  BP: 140/82 126/64  Pulse: 85 72  Resp: 16 16  Temp:    SpO2: 99% 96%    ----------------------------------------- 8:19 AM on 02/02/2018 -----------------------------------------  Patient resting comfortably.  Understanding of delay in transfer to [facility].  Also agreeable to receiving consultation from oncology. No concerns at present.  Spoke with pharmacist, pharmacy will begin following vancomycin levels and dosing of vancomycin and cefepime.   Delman Kitten, MD 02/02/18 [phone]

## 2018-02-02 NOTE — ED Notes (Signed)
ED TO INPATIENT HANDOFF REPORT  ED Nurse Name and Phone #: Anderson Malta [phone]  Name/Age/Gender Rhonda Kane 48 y.o. female Room/Bed: ED06A/ED06A  Code Status   Code Status: Prior  Home/SNF/Other Home Patient oriented to: self, place, time and situation Is this baseline? Yes   Triage Complete: Triage complete  Chief Complaint sepsis  Triage Note Arrives from [facility] for ED evaluation and transfer to [facility].  Hickman was placed last Friday according to patient.  Patient needs blood cultures and Vancomycin according to report from [facility]>   Allergies No Known Allergies  Level of Care/Admitting Diagnosis ED Disposition    ED Disposition Condition [place]: [facility] [[id number]]  Level of Care: Med-Surg [16]  Diagnosis: Pancytopenia [facility]) [[id number]]  Admitting Physician: Demetrios Loll [[id number]]  Attending Physician: Demetrios Loll [[id number]]  Estimated length of stay: past midnight tomorrow  Certification:: I certify this patient will need inpatient services for at least 2 midnights  PT Class (Do Not Modify): Inpatient [101]  PT Acc Code (Do Not Modify): Private [1]       Medical/Surgery History Past Medical History:  Diagnosis Date  . Breast mass, right 2015  . Depression   . Dysmenorrhea   . GERD (gastroesophageal reflux disease)   . Headache    MIGRAINES  . Insomnia   . Insulin resistance   . Irregular menses   . Menometrorrhagia   . Migraine with aura   . PMS (premenstrual syndrome)   . PONV (postoperative nausea and vomiting)    Past Surgical History:  Procedure Laterality Date  . ANKLE ARTHROSCOPY Right 05/07/2015   Procedure: ANKLE ARTHROSCOPY  / WITH TENOLYSIS;  Surgeon: [name], DPM;  Location: [facility] ORS;  Service: Podiatry;  Laterality: Right;  right ankle arthroscopy with debridement, osteochondral repair, tenolysis of posterior tibial tendon  . BREAST FIBROADENOMA SURGERY Right 03/10/2013  . CARPAL TUNNEL  RELEASE Left 09/11/2014   Procedure: CARPAL TUNNEL RELEASE;  Surgeon: Christophe Louis, MD;  Location: [facility] ORS;  Service: Orthopedics;  Laterality: Left;  . CHOLECYSTECTOMY       IV Location/Drains/Wounds Patient Lines/Drains/Airways Status   Active Line/Drains/Airways    Name:   Placement date:   Placement time:   Site:   Days:   Peripheral IV 01/31/18 Left Antecubital   01/31/18    1220    Antecubital   2   Peripheral IV 01/31/18 Right Antecubital   01/31/18    1359    Antecubital   2   Incision (Closed) 09/11/14 Hand Left   09/11/14    0847     1240   Incision (Closed) 05/07/15 Ankle   05/07/15    0903     1002          Intake/Output Last 24 hours  Intake/Output Summary (Last 24 hours) at 02/02/2018 1539 Last data filed at 02/02/2018 1525 Gross per 24 hour  Intake 841.18 ml  Output -  Net 841.18 ml    Labs/Imaging Results for orders placed or performed during the hospital encounter of 01/31/18 (from the past 48 hour(s))  Urine culture     Status: Abnormal   Collection Time: 02/01/18  3:55 AM  Result Value Ref Range   Specimen Description      URINE, RANDOM Performed at [facility], [address], [place], [place] [address]    Special Requests      NONE Performed at [facility], [address] [place], [place]  [address]    Culture MULTIPLE SPECIES PRESENT, SUGGEST RECOLLECTION (A)    Report Status 02/02/2018 FINAL   Urinalysis, Complete w Microscopic     Status: Abnormal   Collection Time: 02/01/18  3:55 AM  Result Value Ref Range   Color, Urine AMBER (A) YELLOW   APPearance HAZY (A) CLEAR   Specific Gravity, Urine 1.009 1.005 - 1.030   pH 6.0 5.0 - 8.0   Glucose, UA NEGATIVE NEGATIVE mg/dL   Hgb urine dipstick LARGE (A) NEGATIVE   Bilirubin Urine NEGATIVE NEGATIVE   Ketones, ur NEGATIVE NEGATIVE mg/dL   Protein, ur 30 (A) NEGATIVE mg/dL   Nitrite NEGATIVE NEGATIVE   Leukocytes, UA NEGATIVE NEGATIVE   RBC / HPF >50 (H) 0 - 5 RBC/hpf    WBC, UA NONE SEEN 0 - 5 WBC/hpf   Bacteria, UA RARE (A) NONE SEEN   Squamous Epithelial / LPF 0-5 0 - 5   Mucus PRESENT    Hyaline Casts, UA PRESENT     Comment: Performed at [facility], [address] [place], [place] [address]  CBC with Differential/Platelet     Status: Abnormal   Collection Time: 02/01/18 10:11 PM  Result Value Ref Range   WBC 1.0 (LL) 4.0 - 10.5 K/uL    Comment: This critical result has verified and been called to NOEL WEBSTER by Silvana Newness on 01 17 2020 at 2309, and has been read back.    RBC 2.26 (L) 3.87 - 5.11 MIL/uL   Hemoglobin 6.7 (L) 12.0 - 15.0 g/dL   HCT 19.9 (L) 36.0 - 46.0 %   MCV 88.1 80.0 - 100.0 fL   MCH 29.6 26.0 - 34.0 pg   MCHC 33.7 30.0 - 36.0 g/dL   RDW 13.2 11.5 - 15.5 %   Platelets 19 (LL) 150 - 400 K/uL    Comment: Immature Platelet Fraction may be clinically indicated, consider ordering this additional test [id number] THIS CRITICAL RESULT HAS VERIFIED AND BEEN CALLED TO NOEL WEBSTER BY QUANISHA DYSON ON 01 17 2020 AT 2309, AND HAS BEEN READ BACK.     nRBC 0.0 0.0 - 0.2 %   Neutrophils Relative % 3 %   Neutro Abs 0.0 (L) 1.7 - 7.7 K/uL   Lymphocytes Relative 52 %   Lymphs Abs 0.5 (L) 0.7 - 4.0 K/uL   Monocytes Relative 45 %   Monocytes Absolute 0.4 0.1 - 1.0 K/uL   Eosinophils Relative 0 %   Eosinophils Absolute 0.0 0.0 - 0.5 K/uL   Basophils Relative 0 %   Basophils Absolute 0.0 0.0 - 0.1 K/uL   WBC Morphology ATYPICAL MONONUCLEAR CELLS    RBC Morphology MORPHOLOGY UNREMARKABLE    Smear Review PLATELETS APPEAR DECREASED    Immature Granulocytes 0 %   Abs Immature Granulocytes 0.00 0.00 - 0.07 K/uL    Comment: Performed at [facility], [address] [place], [place] [address]  Comprehensive metabolic panel     Status: Abnormal   Collection Time: 02/01/18 10:11 PM  Result Value Ref Range   Sodium 139 135 - 145 mmol/L   Potassium 3.4 (L) 3.5 - 5.1 mmol/L   Chloride 107 98 - 111 mmol/L   CO2 24 22 -  32 mmol/L   Glucose, Bld 106 (H) 70 - 99 mg/dL   BUN 12 6 - 20 mg/dL   Creatinine, Ser 1.28 (H) 0.44 - 1.00 mg/dL   Calcium 8.6 (L) 8.9 - 10.3 mg/dL   Total Protein 6.5 6.5 -  8.1 g/dL   Albumin 3.1 (L) 3.5 - 5.0 g/dL   AST 39 15 - 41 U/L   ALT 56 (H) 0 - 44 U/L   Alkaline Phosphatase 89 38 - 126 U/L   Total Bilirubin 0.9 0.3 - 1.2 mg/dL   GFR calc non Af Amer 50 (L) >60 mL/min   GFR calc Af Amer 58 (L) >60 mL/min   Anion gap 8 5 - 15    Comment: Performed at [facility], [address] [place], [place] [address]  Magnesium     Status: Abnormal   Collection Time: 02/01/18 10:11 PM  Result Value Ref Range   Magnesium 1.6 (L) 1.7 - 2.4 mg/dL    Comment: Performed at [facility], [address] [place], [place] [address]  Prepare Pheresed Platelets     Status: None (Preliminary result)   Collection Time: 02/02/18  1:50 PM  Result Value Ref Range   Unit Number [id number]    Blood Component Type PLTPHER LRI1    Unit division 00    Status of Unit ALLOCATED    Unit tag comment IRRADIATED PRODUCT    Transfusion Status OK TO TRANSFUSE    Unit Number [id number]    Blood Component Type PLTPHER LI2    Unit division 00    Status of Unit ALLOCATED    Unit tag comment IRRADIATED PRODUCT    Transfusion Status OK TO TRANSFUSE    No results found.  Pending Labs Unresulted Labs (From admission, onward)    Start     Ordered   02/03/18 0500  CBC  Tomorrow morning,   STAT     02/02/18 1425   02/03/18 0500  CBC with Differential/Platelet  Tomorrow morning,   STAT     02/02/18 1425   Signed and Held  Basic metabolic panel  Tomorrow morning,   R     Signed and Held   Signed and Held  CBC  Tomorrow morning,   R     Signed and Held   Signed and Held  Magnesium  Tomorrow morning,   R     Signed and Held   Signed and Held  CBC  Once,   R     Signed and Held   Signed and Held  Basic metabolic panel  Once,   R     Signed and Held          Vitals/Pain Today's  Vitals   02/02/18 0645 02/02/18 1011 02/02/18 1343 02/02/18 1343  BP:  (!) 179/72  127/76  Pulse:  96  80  Resp:  18  18  Temp:  98.4 F (36.9 C)    TempSrc:      SpO2:  100%  100%  Weight:      Height:      PainSc: Asleep  2      Isolation Precautions Protective Precautions  Medications Medications  metroNIDAZOLE (FLAGYL) IVPB 500 mg (0 mg Intravenous Stopped 02/02/18 1342)  busPIRone (BUSPAR) tablet 5 mg (5 mg Oral Given 02/02/18 1013)  carvedilol (COREG) tablet 6.25 mg (6.25 mg Oral Given 02/02/18 1013)  losartan (COZAAR) tablet 12.5 mg (12.5 mg Oral Given 02/02/18 1013)  pantoprazole (PROTONIX) EC tablet 40 mg (40 mg Oral Given 02/02/18 1013)  traZODone (DESYREL) tablet 50 mg (50 mg Oral Given 02/01/18 2159)  valACYclovir (VALTREX) tablet 500 mg (500 mg Oral Given 02/02/18 1013)  venlafaxine XR (EFFEXOR-XR) 24 hr capsule 300 mg (300 mg Oral Given 02/02/18  0903)  prochlorperazine (COMPAZINE) injection 10 mg (has no administration in time range)  ceFEPIme (MAXIPIME) 2 g in sodium chloride 0.9 % 100 mL IVPB (0 g Intravenous Stopped 02/02/18 1045)  vancomycin (VANCOCIN) 1,250 mg in sodium chloride 0.9 % 250 mL IVPB (0 mg Intravenous Stopped 02/02/18 1525)  0.9 %  sodium chloride infusion (Manually program via Guardrails IV Fluids) (has no administration in time range)  acetaminophen (TYLENOL) tablet 650 mg (has no administration in time range)  diphenhydrAMINE (BENADRYL) injection 25 mg (has no administration in time range)  ceFEPIme (MAXIPIME) 2 g in sodium chloride 0.9 % 100 mL IVPB ( Intravenous Stopped 01/31/18 1407)  vancomycin (VANCOCIN) 1,750 mg in sodium chloride 0.9 % 500 mL IVPB (0 mg Intravenous Stopped 01/31/18 1809)  0.9 %  sodium chloride infusion (0 mLs Intravenous Stopped 01/31/18 1528)  acetaminophen (TYLENOL) tablet 1,000 mg (1,000 mg Oral Given 02/02/18 1041)    Mobility walks Low fall risk   Focused Assessments    Recommendations: See Admitting Provider  Note  Report given to:

## 2018-02-02 NOTE — ED Notes (Signed)
PT resting quietly in bed, eyes closed, respirations, even and nonlabored.  Will await pt to awake before administering AM medications.  Pt continues to await transfer bed at [facility].  Pt remains on protective precautions, will continue to monitor.

## 2018-02-02 NOTE — H&P (Addendum)
[facility] NAME: Rhonda Kane    MR#:  [id number]  DATE OF BIRTH:  12-Oct-1970  DATE OF ADMISSION:  01/31/2018  PRIMARY CARE PHYSICIAN: Maryland Pink, MD   REQUESTING/REFERRING PHYSICIAN: Dr. Jacqualine Code.  CHIEF COMPLAINT:   Chief Complaint  Patient presents with  . Blood Infection   Sepsis and possible infected Port-A-Cath. HISTORY OF PRESENT ILLNESS:  Rhonda Kane  is a 48 y.o. female with a known history of leukemia, status post chemotherapy, breast mass, depression, GERD, headache, insulin resistance etc.  The patient was sent to ED by her oncologist due to hypotension and sepsis with possible infected Port-A-Cath.  She is plan to be transferred to [facility] but no better is available so far.  She has been treated with cefepime and vancomycin in ER for the past 2 days.  She also has pancytopenia secondary to leukemia and chemotherapy.  Sepsis and hypotension have improved.  But she still has pancytopenia.  She denies any symptoms except both arm bruises.  Transfer was canceled since the patient is better.  Dr. Grayland Ormond recommend admit the patient to the hospital, continue IV antibiotics, follow-up cultures, removal of Port-A-Cath and irradiated platelet transfusion. PAST MEDICAL HISTORY:   Past Medical History:  Diagnosis Date  . Breast mass, right 2015  . Depression   . Dysmenorrhea   . GERD (gastroesophageal reflux disease)   . Headache    MIGRAINES  . Insomnia   . Insulin resistance   . Irregular menses   . Menometrorrhagia   . Migraine with aura   . PMS (premenstrual syndrome)   . PONV (postoperative nausea and vomiting)     PAST SURGICAL HISTORY:   Past Surgical History:  Procedure Laterality Date  . ANKLE ARTHROSCOPY Right 05/07/2015   Procedure: ANKLE ARTHROSCOPY  / WITH TENOLYSIS;  Surgeon: [name], DPM;  Location: [facility] ORS;  Service: Podiatry;  Laterality: Right;  right ankle arthroscopy with  debridement, osteochondral repair, tenolysis of posterior tibial tendon  . BREAST FIBROADENOMA SURGERY Right 03/10/2013  . CARPAL TUNNEL RELEASE Left 09/11/2014   Procedure: CARPAL TUNNEL RELEASE;  Surgeon: Christophe Louis, MD;  Location: [facility] ORS;  Service: Orthopedics;  Laterality: Left;  . CHOLECYSTECTOMY      SOCIAL HISTORY:   Social History   Tobacco Use  . Smoking status: Never Smoker  . Smokeless tobacco: Never Used  Substance Use Topics  . Alcohol use: No    FAMILY HISTORY:   Family History  Problem Relation Age of Onset  . COPD Mother   . COPD Father     DRUG ALLERGIES:  No Known Allergies  REVIEW OF SYSTEMS:   Review of Systems  Constitutional: Negative for chills, fever and malaise/fatigue.  HENT: Negative for sore throat.   Eyes: Negative for blurred vision and double vision.  Respiratory: Negative for cough, hemoptysis, shortness of breath, wheezing and stridor.   Cardiovascular: Negative for chest pain, palpitations, orthopnea and leg swelling.  Gastrointestinal: Negative for abdominal pain, blood in stool, diarrhea, melena, nausea and vomiting.  Genitourinary: Negative for dysuria, flank pain and hematuria.  Musculoskeletal: Negative for back pain and joint pain.  Skin: Negative for rash.  Neurological: Negative for dizziness, sensory change, focal weakness, seizures, loss of consciousness, weakness and headaches.  Endo/Heme/Allergies: Negative for polydipsia.  Psychiatric/Behavioral: Negative for depression. The patient is not nervous/anxious.     MEDICATIONS AT HOME:   Prior to Admission medications   Medication Sig Start Date  End Date Taking? Authorizing Provider  busPIRone (BUSPAR) 5 MG tablet Take 5 mg by mouth 2 (two) times daily. 12/11/17 12/11/18 Yes [provider]  carvedilol (COREG) 6.25 MG tablet Take 6.25 mg by mouth 2 (two) times daily. 01/26/18  Yes [provider]  furosemide (LASIX) 20 MG tablet Take 20 mg by mouth  daily. 01/26/18  Yes [provider]  ibuprofen (ADVIL,MOTRIN) 600 MG tablet Take 600 mg by mouth daily as needed.   Yes [provider]  levofloxacin (LEVAQUIN) 500 MG tablet Take 500 mg by mouth daily. 01/26/18  Yes [provider]  losartan (COZAAR) 25 MG tablet Take 12.5 mg by mouth daily. 01/26/18  Yes [provider]  pantoprazole (PROTONIX) 40 MG tablet Take 40 mg by mouth daily. 01/26/18  Yes [provider]  posaconazole (NOXAFIL) 100 MG TBEC delayed-release tablet Take 300 mg by mouth every 6 (six) hours as needed. 01/26/18  Yes [provider]  traZODone (DESYREL) 50 MG tablet Take 50 mg by mouth daily as needed.   Yes [provider]  valACYclovir (VALTREX) 500 MG tablet Take 500 mg by mouth daily. 01/26/18  Yes [provider]  venlafaxine (EFFEXOR) 100 MG tablet Take 300 mg by mouth daily. 300 MG    Yes [provider]      VITAL SIGNS:  Blood pressure 127/76, pulse 80, temperature 98.4 F (36.9 C), resp. rate 18, height 5' 4.5" (1.638 m), weight 95.7 kg, SpO2 100 %.  PHYSICAL EXAMINATION:  Physical Exam  GENERAL:  48 y.o.-year-old patient lying in the bed with no acute distress.  Obesity. EYES: Pupils equal, round, reactive to light and accommodation. No scleral icterus. Extraocular muscles intact.  HEENT: Head atraumatic, normocephalic. Oropharynx and nasopharynx clear.  NECK:  Supple, no jugular venous distention. No thyroid enlargement, no tenderness.  LUNGS: Normal breath sounds bilaterally, no wheezing, rales,rhonchi or crepitation. No use of accessory muscles of respiration.  CARDIOVASCULAR: S1, S2 normal. No murmurs, rubs, or gallops.  ABDOMEN: Soft, nontender, nondistended. Bowel sounds present. No organomegaly or mass.  EXTREMITIES: No pedal edema, cyanosis, or clubbing.  NEUROLOGIC: Cranial nerves II through XII are intact. Muscle strength 5/5 in all extremities. Sensation intact. Gait not  checked.  PSYCHIATRIC: The patient is alert and oriented x 3.  SKIN: No obvious rash, lesion, or ulcer.  Bruises on arms.  LABORATORY PANEL:   CBC Recent Labs  Lab 02/01/18 2211  WBC 1.0*  HGB 6.7*  HCT 19.9*  PLT 19*   ------------------------------------------------------------------------------------------------------------------  Chemistries  Recent Labs  Lab 02/01/18 2211  NA 139  K 3.4*  CL 107  CO2 24  GLUCOSE 106*  BUN 12  CREATININE 1.28*  CALCIUM 8.6*  MG 1.6*  AST 39  ALT 56*  ALKPHOS 89  BILITOT 0.9   ------------------------------------------------------------------------------------------------------------------  Cardiac Enzymes Recent Labs  Lab 01/31/18 1344  TROPONINI 0.05*   ------------------------------------------------------------------------------------------------------------------  RADIOLOGY:  No results found.    IMPRESSION AND PLAN:   Sepsis and neutropenic fever, possible due to Hickman Cath infection. Continue cefepime, Flagyl, Vacyclovir and vancomycin, follow-up cultures. Follow-up vascular surgeon for Port-A-Cath removal. Per Dr. Grayland Ormond, if blood cultures remain negative and tip of Hickman catheter is negative for bacterial growth, patient can likely be discharged on 10-14 day oral doxycycline.  If catheter tip is positive, patient will require extended IV antibiotics.  AML with pancytopenia. Radiated platelet and PRBC transfusion per Dr. Grayland Ormond.  Follow-up CBC.  Anemia. Hb down to 5.5.  Two  unit irradiated PRBC transfusion per Dr. Grayland Ormond.  Hypokalemia.  Potassium supplement. Hypomagnesemia.  Magnesium supplement. Acute renal failure due to sepsis and hypotension.  Improved.  I discussed with Dr. Grayland Ormond. All the records are reviewed and case discussed with ED provider. Management plans discussed with the patient, her husband and son and they are in agreement.  CODE STATUS: Full code  TOTAL TIME TAKING CARE  OF THIS PATIENT: 38 minutes.    Demetrios Loll M.D on 02/02/2018 at 2:37 PM  Between 7am to 6pm - Pager - [phone]  After 6pm go to [email or web address] - Proofreader  [facility]  Office  [phone]  CC: Primary care physician; Maryland Pink, MD   Note: This dictation was prepared with Dragon dictation along with smaller phrase technology. Any transcriptional errors that result from this process are unin

## 2018-02-02 NOTE — Progress Notes (Signed)
Advanced Care Plan.  Purpose of Encounter: CODE STATUS. Parties in Attendance: Patient, her husband and son, me. Patient's Decisional Capacity: Yes. Medical Story: Rhonda Kane  is a 48 y.o. female with a known history of leukemia, status post chemotherapy, breast mass, depression, GERD, headache, insulin resistance etc. The patient is admitted for  sepsis with possible infected Port-A-Cath and pancytopenia.  I discussed with the patient about her current condition, poor prognosis and CODE STATUS.  The patient was in DNR status last year.  But she wants to be resuscitated and intubated if she has cardiopulmonary arrest this time. Plan:  Code Status: Full code. Time spent discussing advance care planning: 17 minutes.

## 2018-02-02 NOTE — Progress Notes (Signed)
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  INTERVAL HISTORY: Patient still boarding in the emergency room awaiting transfer to [facility].  Clinically has significantly improved and she feels nearly back to her baseline.  Patient offers no complaints today.  REVIEW OF SYSTEMS:   Review of Systems  Constitutional: Negative.  Negative for fever, malaise/fatigue and weight loss.  Respiratory: Negative for cough, hemoptysis and shortness of breath.   Cardiovascular: Negative.  Negative for chest pain and leg swelling.  Gastrointestinal: Negative.  Negative for abdominal pain.  Genitourinary: Negative.  Negative for dysuria.  Musculoskeletal: Negative for back pain.  Skin: Negative.  Negative for rash.  Neurological: Negative.  Negative for focal weakness, weakness and headaches.  Endo/Heme/Allergies: Does not bruise/bleed easily.  Psychiatric/Behavioral: Negative.  The patient is not nervous/anxious.     As per HPI. Otherwise, a complete review of systems is negative.  PAST MEDICAL HISTORY: Past Medical History:  Diagnosis Date  . Breast mass, right 2015  . Depression   . Dysmenorrhea   . GERD (gastroesophageal reflux disease)   . Headache    MIGRAINES  . Insomnia   . Insulin resistance   . Irregular menses   . Menometrorrhagia   . Migraine with aura   . PMS (premenstrual syndrome)   . PONV (postoperative nausea and vomiting)     PAST SURGICAL HISTORY: Past Surgical History:  Procedure Laterality Date  . ANKLE ARTHROSCOPY Right 05/07/2015   Procedure: ANKLE ARTHROSCOPY  / WITH TENOLYSIS;  Surgeon: [name], DPM;  Location: [facility] ORS;  Service: Podiatry;  Laterality: Right;  right ankle arthroscopy with debridement,  osteochondral repair, tenolysis of posterior tibial tendon  . BREAST FIBROADENOMA SURGERY Right 03/10/2013  . CARPAL TUNNEL RELEASE Left 09/11/2014   Procedure: CARPAL TUNNEL RELEASE;  Surgeon: Christophe Louis, MD;  Location: [facility] ORS;  Service: Orthopedics;  Laterality: Left;  . CHOLECYSTECTOMY      FAMILY HISTORY: Family History  Problem Relation Age of Onset  . COPD Mother   . COPD Father     ADVANCED DIRECTIVES (Y/N):  '@ADVDIR'$ @  HEALTH MAINTENANCE: Social History   Tobacco Use  . Smoking status: Never Smoker  . Smokeless tobacco: Never Used  Substance Use Topics  . Alcohol use: No  . Drug use: No     Colonoscopy:  PAP:  Bone density:  Lipid panel:  No Known Allergies  Current Facility-Administered Medications  Medication Dose Route Frequency Provider Last Rate Last Dose  . 0.9 %  sodium chloride infusion (Manually program via Guardrails IV Fluids)   Intravenous Once Lloyd Huger, MD      . acetaminophen (TYLENOL) tablet 650 mg  650 mg Oral Once Lloyd Huger, MD      . busPIRone (BUSPAR) tablet 5 mg  5 mg Oral BID Carrie Mew, MD   5 mg at 02/02/18 1013  . carvedilol (COREG) tablet 6.25 mg  6.25 mg Oral BID Carrie Mew, MD   6.25 mg at 02/02/18 1013  . ceFEPIme (MAXIPIME) 2 g in sodium chloride 0.9 % 100 mL IVPB  2 g Intravenous Q8H Dallie Piles, RPH   Stopped at 02/02/18 1045  . diphenhydrAMINE (BENADRYL) injection 25 mg  25 mg Intravenous Once Lloyd Huger, MD      .  losartan (COZAAR) tablet 12.5 mg  12.5 mg Oral Daily Carrie Mew, MD   12.5 mg at 02/02/18 1013  . metroNIDAZOLE (FLAGYL) IVPB 500 mg  500 mg Intravenous Q8H Earleen Newport, MD   Stopped at 02/02/18 1342  . pantoprazole (PROTONIX) EC tablet 40 mg  40 mg Oral Daily Carrie Mew, MD   40 mg at 02/02/18 1013  . prochlorperazine (COMPAZINE) injection 10 mg  10 mg Intravenous Q4H PRN Carrie Mew, MD      . traZODone (DESYREL) tablet 50 mg  50 mg  Oral QHS PRN Carrie Mew, MD   50 mg at 02/01/18 2159  . valACYclovir (VALTREX) tablet 500 mg  500 mg Oral Daily Carrie Mew, MD   500 mg at 02/02/18 1013  . vancomycin (VANCOCIN) 1,250 mg in sodium chloride 0.9 % 250 mL IVPB  1,250 mg Intravenous Q24H Dallie Piles, RPH 166.7 mL/hr at 02/02/18 1346 1,250 mg at 02/02/18 1346  . venlafaxine XR (EFFEXOR-XR) 24 hr capsule 300 mg  300 mg Oral Q breakfast Carrie Mew, MD   300 mg at 02/02/18 1093   Current Outpatient Medications  Medication Sig Dispense Refill  . busPIRone (BUSPAR) 5 MG tablet Take 5 mg by mouth 2 (two) times daily.    . carvedilol (COREG) 6.25 MG tablet Take 6.25 mg by mouth 2 (two) times daily.    . furosemide (LASIX) 20 MG tablet Take 20 mg by mouth daily.    Marland Kitchen ibuprofen (ADVIL,MOTRIN) 600 MG tablet Take 600 mg by mouth daily as needed.    Marland Kitchen levofloxacin (LEVAQUIN) 500 MG tablet Take 500 mg by mouth daily.    Marland Kitchen losartan (COZAAR) 25 MG tablet Take 12.5 mg by mouth daily.    . pantoprazole (PROTONIX) 40 MG tablet Take 40 mg by mouth daily.    . posaconazole (NOXAFIL) 100 MG TBEC delayed-release tablet Take 300 mg by mouth every 6 (six) hours as needed.    . traZODone (DESYREL) 50 MG tablet Take 50 mg by mouth daily as needed.    . valACYclovir (VALTREX) 500 MG tablet Take 500 mg by mouth daily.    Marland Kitchen venlafaxine (EFFEXOR) 100 MG tablet Take 300 mg by mouth daily. 300 MG       OBJECTIVE: Vitals:   02/02/18 1011 02/02/18 1343  BP: (!) 179/72 127/76  Pulse: 96 80  Resp: 18 18  Temp: 98.4 F (36.9 C)   SpO2: 100% 100%     Body mass index is 35.66 kg/m.    ECOG FS:0 - Asymptomatic  General: Well-developed, well-nourished, no acute distress. Eyes: Pink conjunctiva, anicteric sclera. HEENT: Normocephalic, moist mucous membranes, clear oropharnyx. Lungs: Clear to auscultation bilaterally. Heart: Regular rate and rhythm. No rubs, murmurs, or gallops. Abdomen: Soft, nontender, nondistended. No organomegaly  noted, normoactive bowel sounds. Musculoskeletal: No edema, cyanosis, or clubbing. Neuro: Alert, answering all questions appropriately. Cranial nerves grossly intact. Skin: No rashes or petechiae noted. Psych: Normal affect. Lymphatics: No cervical, calvicular, axillary or inguinal LAD.   LAB RESULTS:  Lab Results  Component Value Date   NA 139 02/01/2018   K 3.4 (L) 02/01/2018   CL 107 02/01/2018   CO2 24 02/01/2018   GLUCOSE 106 (H) 02/01/2018   BUN 12 02/01/2018   CREATININE 1.28 (H) 02/01/2018   CALCIUM 8.6 (L) 02/01/2018   PROT 6.5 02/01/2018   ALBUMIN 3.1 (L) 02/01/2018   AST 39 02/01/2018   ALT 56 (H) 02/01/2018   ALKPHOS 89 02/01/2018   BILITOT  0.9 02/01/2018   GFRNONAA 50 (L) 02/01/2018   GFRAA 58 (L) 02/01/2018    Lab Results  Component Value Date   WBC 1.0 (LL) 02/01/2018   NEUTROABS 0.0 (L) 02/01/2018   HGB 6.7 (L) 02/01/2018   HCT 19.9 (L) 02/01/2018   MCV 88.1 02/01/2018   PLT 19 (LL) 02/01/2018     STUDIES: Dg Chest Port 1 View  Result Date: 01/31/2018 CLINICAL DATA:  Patient status post central line placement 01/25/2018. History of breast cancer. EXAM: PORTABLE CHEST 1 VIEW COMPARISON:  CT chest 02/06/2011. FINDINGS: Right IJ approach central venous catheter is in place with its tip in the mid to lower superior vena cava. No pneumothorax. Lungs are clear. Heart size is normal. No acute or focal bony abnormality. IMPRESSION: Right IJ catheter tip mid to lower is in the superior vena cava. No pneumothorax or acute disease. Electronically Signed   By: Inge Rise M.D.   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  PLAN:    1.  AML: Patient last received chemotherapy approximately 7 to 10 days ago.  Plan was to repeat bone marrow biopsy later this week to assess whether additional chemotherapy is needed.  Patient has been instructed to keep her follow-up appointment with her primary oncologist  at [facility] this coming Wednesday. 2.  Pancytopenia: Secondary to acute leukemia as well as chemotherapy.  Repeat CBC is pending at time of dictation. 3.  Thrombocytopenia: Patient will require 2 units irradiated platelets prior to removal of Hickman catheter today or tomorrow.  These have been ordered. 4.  Anemia: Continue to transfuse if hemoglobin below 8.0.  All blood products must be irradiated. 5.  Infected Hickman catheter: Blood cultures are negative to date, patient afebrile.  Patient unable to mount a white blood cell count.  Continue current antibiotics.  Case discussed with vascular surgery who plans to remove Hickman catheter later today or tomorrow once platelets are transfused. 6.  ID: If blood cultures remain negative and tip of Hickman catheter is negative for bacterial growth, patient can likely be discharged on 10-14 day oral doxycycline.  If catheter tip is positive, patient will require extended IV antibiotics. 7.  Disposition: Admit to hospitalist service for removal of Hickman catheter, 24 to 48 hours of additional IV antibiotics, and transfusion support as needed.  Will follow.   Lloyd Huger, MD   02/02/2018 2:02 PM

## 2018-02-02 NOTE — ED Notes (Signed)
Follow up with  [facility]. Facility still at capacity. Was asked to call back mid day

## 2018-02-02 NOTE — ED Notes (Signed)
Pt provided breakfast tray and Effexor.  Pt voices no c/o or needs at this time.  Will obtain VS and further assessment after she eats.  Will continue to monitor.

## 2018-02-02 NOTE — ED Provider Notes (Addendum)
Patient remains at [facility] ER awaiting transfer.  This morning, platelet count is noted to be lower at 19.  Patient's goals remain to go to [facility], concern for possible infected catheter.  Placed consultations to infectious disease as well as oncology to see and evaluate for further treatment and care recommendations given the patient's complex medical status, thrombocytopenia, concern for infection and prolonged, ongoing boarding status in the ER.   Delman Kitten, MD 02/02/18 [phone]   ----------------------------------------- 7:59 AM on 02/02/2018 -----------------------------------------  Dr. Nada Libman is coming to round this morning, he will see patient in consult as well in Er.    Delman Kitten, MD 02/02/18 [phone]

## 2018-02-03 DIAGNOSIS — C9500 Acute leukemia of unspecified cell type not having achieved remission: Secondary | ICD-10-CM | POA: Diagnosis not present

## 2018-02-03 LAB — CBC WITH DIFFERENTIAL/PLATELET
Abs Immature Granulocytes: 0 10*3/uL (ref 0.00–0.07)
Basophils Absolute: 0 10*3/uL (ref 0.0–0.1)
Basophils Relative: 0 %
Eosinophils Absolute: 0 10*3/uL (ref 0.0–0.5)
Eosinophils Relative: 0 %
HCT: 24.1 % — ABNORMAL LOW (ref 36.0–46.0)
Hemoglobin: 8.5 g/dL — ABNORMAL LOW (ref 12.0–15.0)
Immature Granulocytes: 0 %
Lymphocytes Relative: 35 %
Lymphs Abs: 0.6 10*3/uL — ABNORMAL LOW (ref 0.7–4.0)
MCH: 29.2 pg (ref 26.0–34.0)
MCHC: 35.3 g/dL (ref 30.0–36.0)
MCV: 82.8 fL (ref 80.0–100.0)
Monocytes Absolute: 1 10*3/uL (ref 0.1–1.0)
Monocytes Relative: 62 %
NEUTROS PCT: 3 %
Neutro Abs: 0.1 10*3/uL — ABNORMAL LOW (ref 1.7–7.7)
PLATELETS: 31 10*3/uL — AB (ref 150–400)
RBC: 2.91 MIL/uL — AB (ref 3.87–5.11)
RDW: 13.1 % (ref 11.5–15.5)
Smear Review: DECREASED
WBC: 1.7 10*3/uL — AB (ref 4.0–10.5)
nRBC: 0 % (ref 0.0–0.2)

## 2018-02-03 LAB — BPAM PLATELET PHERESIS
BLOOD PRODUCT EXPIRATION DATE: 202001192359
Blood Product Expiration Date: 202001192359
ISSUE DATE / TIME: 202001181733
ISSUE DATE / TIME: 202001182042
Unit Type and Rh: 6200
Unit Type and Rh: 6200

## 2018-02-03 LAB — PREPARE RBC (CROSSMATCH)

## 2018-02-03 LAB — MAGNESIUM: Magnesium: 1.6 mg/dL — ABNORMAL LOW (ref 1.7–2.4)

## 2018-02-03 LAB — PREPARE PLATELET PHERESIS
Unit division: 0
Unit division: 0

## 2018-02-03 LAB — BASIC METABOLIC PANEL
Anion gap: 8 (ref 5–15)
BUN: 16 mg/dL (ref 6–20)
CO2: 25 mmol/L (ref 22–32)
Calcium: 8.6 mg/dL — ABNORMAL LOW (ref 8.9–10.3)
Chloride: 105 mmol/L (ref 98–111)
Creatinine, Ser: 1.37 mg/dL — ABNORMAL HIGH (ref 0.44–1.00)
GFR calc Af Amer: 53 mL/min — ABNORMAL LOW (ref >60)
GFR, EST NON AFRICAN AMERICAN: 46 mL/min — AB (ref >60)
Glucose, Bld: 104 mg/dL — ABNORMAL HIGH (ref 70–99)
Potassium: 3.2 mmol/L — ABNORMAL LOW (ref 3.5–5.1)
Sodium: 138 mmol/L (ref 135–145)

## 2018-02-03 MED ORDER — DOXYCYCLINE HYCLATE 100 MG PO TABS: 100.0000 mg | ORAL_TABLET | Freq: Two times a day (BID) | ORAL | 0 refills | Status: AC

## 2018-02-03 MED ORDER — LIDOCAINE HCL 1 % IJ SOLN
5.0000 mL | Freq: Once | INTRAMUSCULAR | Status: AC
  Administered 2018-02-03: 09:00:00 5 mL via INTRADERMAL
  Filled 2018-02-03: qty 5

## 2018-02-03 NOTE — Consult Note (Addendum)
Reason for Consult:Infected Hickman/ AML Referring Physician: Dr. Allyne Gee is an 48 y.o. female.  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 Noted to have increased erythema from site. Now requires removal. Platelets and HgB 15/5. S/P transfusion. 31/8.5 Without complaint this morning  Past Medical History:  Diagnosis Date  . Breast mass, right 2015  . Depression   . Dysmenorrhea   . GERD (gastroesophageal reflux disease)   . Headache    MIGRAINES  . Insomnia   . Insulin resistance   . Irregular menses   . Menometrorrhagia   . Migraine with aura   . PMS (premenstrual syndrome)   . PONV (postoperative nausea and vomiting)     Past Surgical History:  Procedure Laterality Date  . ANKLE ARTHROSCOPY Right 05/07/2015   Procedure: ANKLE ARTHROSCOPY  / WITH TENOLYSIS;  Surgeon: [name], DPM;  Location: [facility] ORS;  Service: Podiatry;  Laterality: Right;  right ankle arthroscopy with debridement, osteochondral repair, tenolysis of posterior tibial tendon  . BREAST FIBROADENOMA SURGERY Right 03/10/2013  . CARPAL TUNNEL RELEASE Left 09/11/2014   Procedure: CARPAL TUNNEL RELEASE;  Surgeon: Christophe Louis, MD;  Location: [facility] ORS;  Service: Orthopedics;  Laterality: Left;  . CHOLECYSTECTOMY      Family History  Problem Relation Age of Onset  . COPD Mother   . COPD Father     Social History:  reports that she has never smoked. She has never used smokeless tobacco. She reports that she does not drink alcohol or use drugs.  Allergies: No Known Allergies  Medications: I have reviewed the patient's current medications.  Results for orders placed or performed during the hospital encounter of 01/31/18 (from the past 48 hour(s))  CBC with Differential/Platelet     Status: Abnormal   Collection Time: 02/01/18 10:11 PM  Result Value Ref Range   WBC 1.0 (LL) 4.0 - 10.5 K/uL    Comment: This critical  result has verified and been called to NOEL WEBSTER by Silvana Newness on 01 17 2020 at 2309, and has been read back.    RBC 2.26 (L) 3.87 - 5.11 MIL/uL   Hemoglobin 6.7 (L) 12.0 - 15.0 g/dL   HCT 19.9 (L) 36.0 - 46.0 %   MCV 88.1 80.0 - 100.0 fL   MCH 29.6 26.0 - 34.0 pg   MCHC 33.7 30.0 - 36.0 g/dL   RDW 13.2 11.5 - 15.5 %   Platelets 19 (LL) 150 - 400 K/uL    Comment: Immature Platelet Fraction may be clinically indicated, consider ordering this additional test [id number] THIS CRITICAL RESULT HAS VERIFIED AND BEEN CALLED TO NOEL WEBSTER BY QUANISHA DYSON ON 01 17 2020 AT 2309, AND HAS BEEN READ BACK.     nRBC 0.0 0.0 - 0.2 %   Neutrophils Relative % 3 %   Neutro Abs 0.0 (L) 1.7 - 7.7 K/uL   Lymphocytes Relative 52 %   Lymphs Abs 0.5 (L) 0.7 - 4.0 K/uL   Monocytes Relative 45 %   Monocytes Absolute 0.4 0.1 - 1.0 K/uL   Eosinophils Relative 0 %   Eosinophils Absolute 0.0 0.0 - 0.5 K/uL   Basophils Relative 0 %   Basophils Absolute 0.0 0.0 - 0.1 K/uL   WBC Morphology ATYPICAL MONONUCLEAR CELLS    RBC Morphology MORPHOLOGY UNREMARKABLE    Smear Review PLATELETS APPEAR DECREASED    Immature Granulocytes 0 %   Abs Immature Granulocytes 0.00 0.00 -  0.07 K/uL    Comment: Performed at [facility], [address] [place], [place] [address]  Comprehensive metabolic panel     Status: Abnormal   Collection Time: 02/01/18 10:11 PM  Result Value Ref Range   Sodium 139 135 - 145 mmol/L   Potassium 3.4 (L) 3.5 - 5.1 mmol/L   Chloride 107 98 - 111 mmol/L   CO2 24 22 - 32 mmol/L   Glucose, Bld 106 (H) 70 - 99 mg/dL   BUN 12 6 - 20 mg/dL   Creatinine, Ser 1.28 (H) 0.44 - 1.00 mg/dL   Calcium 8.6 (L) 8.9 - 10.3 mg/dL   Total Protein 6.5 6.5 - 8.1 g/dL   Albumin 3.1 (L) 3.5 - 5.0 g/dL   AST 39 15 - 41 U/L   ALT 56 (H) 0 - 44 U/L   Alkaline Phosphatase 89 38 - 126 U/L   Total Bilirubin 0.9 0.3 - 1.2 mg/dL   GFR calc non Af Amer 50 (L) >60 mL/min   GFR calc Af Amer 58 (L) >60  mL/min   Anion gap 8 5 - 15    Comment: Performed at [facility], [address], [place], [place] [address]  Magnesium     Status: Abnormal   Collection Time: 02/01/18 10:11 PM  Result Value Ref Range   Magnesium 1.6 (L) 1.7 - 2.4 mg/dL    Comment: Performed at [facility], [address] [place], [place] [address]  Prepare Pheresed Platelets     Status: None   Collection Time: 02/02/18  1:50 PM  Result Value Ref Range   Unit Number [id number]    Blood Component Type PLTPHER LRI1    Unit division 00    Status of Unit ISSUED,FINAL    Unit tag comment IRRADIATED PRODUCT    Transfusion Status      OK TO TRANSFUSE Performed at [facility], [address], [place], [place] [address]    Unit Number [id number]    Blood Component Type PLTPHER LI2    Unit division 00    Status of Unit ISSUED,FINAL    Unit tag comment IRRADIATED PRODUCT    Transfusion Status OK TO TRANSFUSE   CBC     Status: Abnormal   Collection Time: 02/02/18  4:24 PM  Result Value Ref Range   WBC 0.8 (LL) 4.0 - 10.5 K/uL    Comment: CRITICAL VALUE NOTED.  VALUE IS CONSISTENT WITH PREVIOUSLY REPORTED AND CALLED VALUE. MLK    RBC 1.87 (L) 3.87 - 5.11 MIL/uL   Hemoglobin 5.5 (L) 12.0 - 15.0 g/dL   HCT 16.4 (L) 36.0 - 46.0 %   MCV 87.7 80.0 - 100.0 fL   MCH 29.4 26.0 - 34.0 pg   MCHC 33.5 30.0 - 36.0 g/dL   RDW 13.1 11.5 - 15.5 %   Platelets 15 (LL) 150 - 400 K/uL    Comment: Immature Platelet Fraction may be clinically indicated, consider ordering this additional test [id number] CRITICAL VALUE NOTED.  VALUE IS CONSISTENT WITH PREVIOUSLY REPORTED AND CALLED VALUE. [facility] Performed at [facility], [address] [place], [place] [address]   Basic metabolic panel     Status: Abnormal   Collection Time: 02/02/18  4:24 PM  Result Value Ref Range   Sodium 139 135 - 145 mmol/L   Potassium 3.2 (L) 3.5 - 5.1 mmol/L   Chloride 110 98 - 111 mmol/L   CO2 23 22 - 32  mmol/L  Glucose, Bld 145 (H) 70 - 99 mg/dL   BUN 14 6 - 20 mg/dL   Creatinine, Ser 1.31 (H) 0.44 - 1.00 mg/dL   Calcium 8.2 (L) 8.9 - 10.3 mg/dL   GFR calc non Af Amer 48 (L) >60 mL/min   GFR calc Af Amer 56 (L) >60 mL/min   Anion gap 6 5 - 15    Comment: Performed at [facility], [address] [place], [place] [address]  Prepare RBC     Status: None   Collection Time: 02/02/18 10:08 PM  Result Value Ref Range   Order Confirmation      ORDER PROCESSED BY BLOOD BANK Performed at [facility], [address] [place], [place] [address]   Type and screen [place]     Status: None (Preliminary result)   Collection Time: 02/02/18 10:08 PM  Result Value Ref Range   ABO/RH(D) O POS    Antibody Screen NEG    Sample Expiration 02/05/2018    Unit Number [id number]    Blood Component Type RBC, LR IRR    Unit division 00    Status of Unit ALLOCATED    Transfusion Status OK TO TRANSFUSE    Crossmatch Result Compatible    Unit Number [id number]    Blood Component Type RBC, LR IRR    Unit division 00    Status of Unit ISSUED    Transfusion Status OK TO TRANSFUSE    Crossmatch Result Compatible    Unit Number [id number]    Blood Component Type RCLI PHER 2    Unit division 00    Status of Unit ISSUED,FINAL    Transfusion Status OK TO TRANSFUSE    Crossmatch Result      Compatible Performed at [facility], [address] [place],  [address]    Unit Number [id number]    Blood Component Type RBC, LR IRR    Unit division 00    Status of Unit ALLOCATED    Transfusion Status OK TO TRANSFUSE    Crossmatch Result Compatible   CBC with Differential/Platelet     Status: Abnormal   Collection Time: 02/03/18  7:57 AM  Result Value Ref Range   WBC 1.7 (L) 4.0 - 10.5 K/uL   RBC 2.91 (L) 3.87 - 5.11 MIL/uL   Hemoglobin 8.5 (L) 12.0 - 15.0 g/dL   HCT 24.1 (L) 36.0 - 46.0 %   MCV 82.8 80.0 - 100.0 fL   MCH 29.2 26.0  - 34.0 pg   MCHC 35.3 30.0 - 36.0 g/dL   RDW 13.1 11.5 - 15.5 %   Platelets 31 (L) 150 - 400 K/uL    Comment: Immature Platelet Fraction may be clinically indicated, consider ordering this additional test [id number]    nRBC 0.0 0.0 - 0.2 %   Neutrophils Relative % 3 %   Neutro Abs 0.1 (L) 1.7 - 7.7 K/uL   Lymphocytes Relative 35 %   Lymphs Abs 0.6 (L) 0.7 - 4.0 K/uL   Monocytes Relative 62 %   Monocytes Absolute 1.0 0.1 - 1.0 K/uL   Eosinophils Relative 0 %   Eosinophils Absolute 0.0 0.0 - 0.5 K/uL   Basophils Relative 0 %   Basophils Absolute 0.0 0.0 - 0.1 K/uL   WBC Morphology ATYPICAL MONONUCLEAR CELLS    RBC Morphology MORPHOLOGY UNREMARKABLE    Smear Review PLATELETS APPEAR DECREASED    Immature Granulocytes 0 %   Abs Immature Granulocytes 0.00 0.00 -  0.07 K/uL    Comment: Performed at [facility], [address] [place], [place] [address]  Basic metabolic panel     Status: Abnormal   Collection Time: 02/03/18  7:57 AM  Result Value Ref Range   Sodium 138 135 - 145 mmol/L   Potassium 3.2 (L) 3.5 - 5.1 mmol/L   Chloride 105 98 - 111 mmol/L   CO2 25 22 - 32 mmol/L   Glucose, Bld 104 (H) 70 - 99 mg/dL   BUN 16 6 - 20 mg/dL   Creatinine, Ser 1.37 (H) 0.44 - 1.00 mg/dL   Calcium 8.6 (L) 8.9 - 10.3 mg/dL   GFR calc non Af Amer 46 (L) >60 mL/min   GFR calc Af Amer 53 (L) >60 mL/min   Anion gap 8 5 - 15    Comment: Performed at [facility], [address], [place], [place] [address]  Magnesium     Status: Abnormal   Collection Time: 02/03/18  7:57 AM  Result Value Ref Range   Magnesium 1.6 (L) 1.7 - 2.4 mg/dL    Comment: Performed at [facility], [address] [place], [place] [address]    No results found.  Review of Systems  Constitutional: Negative.   Respiratory: Negative.   Cardiovascular: Negative.   Gastrointestinal: Negative.   Musculoskeletal: Negative.   Skin: Negative.    Blood pressure (!) 157/80, pulse 78,  temperature 99.2 F (37.3 C), temperature source Oral, resp. rate 18, height 5' 4.5" (1.638 m), weight 96.3 kg, SpO2 98 %. Physical Exam  Nursing note and vitals reviewed. Constitutional: She is oriented to person, place, and time. She appears well-developed. No distress.  Cardiovascular: Normal rate and regular rhythm.  Respiratory: Effort normal and breath sounds normal.  Right anterior chest wall Hickman- erythema, tender, purulence at entry site.  GI: Soft. Bowel sounds are normal. She exhibits no distension. There is no abdominal tenderness.  Musculoskeletal:        General: No tenderness or edema.  Neurological: She is alert and oriented to person, place, and time.  Skin: Skin is warm.    Assessment/Plan: AML with infected Hickman  Will remove Hickman. Send Catheter tip for Culture.   Consent obtained. Right ACW prepped and draped in a sterile fashion. Suture removed. The catheter was removed easily. No dissection or local anesthesia needed as the catheter was grossly infected and not incorporated into the surrounding tissues. Pressure was held at the IJ entry site and ACW track for 10 minutes. Dry dressing placed. Patient tolerated the procedure well.  ,  A 02/03/2018, 9:12 AM

## 2018-02-03 NOTE — Progress Notes (Signed)
Pt D/C to home with husband. IVs removed intact. VSS. Education completed. All belongings sent with pt. All questions answered.

## 2018-02-03 NOTE — Discharge Summary (Signed)
Rhonda Kane at [place] NAME: Rhonda Kane    MR#:  [id number]  DATE OF BIRTH:  Oct 29, 1970  DATE OF ADMISSION:  01/31/2018 ADMITTING PHYSICIAN: Demetrios Loll, MD  DATE OF DISCHARGE: 02/03/2018  PRIMARY CARE PHYSICIAN: [place] Pink, MD    ADMISSION DIAGNOSIS:  Central line infection, initial encounter [T80.219A] 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[place]) [C92.00] Neutropenia, unspecified type ([place]) [D70.9]  DISCHARGE DIAGNOSIS:  Active Problems:   Pancytopenia ([place])   SECONDARY DIAGNOSIS:   Past Medical History:  Diagnosis Date  . Breast mass, right 2015  . Depression   . Dysmenorrhea   . GERD (gastroesophageal reflux disease)   . Headache    MIGRAINES  . Insomnia   . Insulin resistance   . Irregular menses   . Menometrorrhagia   . Migraine with aura   . PMS (premenstrual syndrome)   . PONV (postoperative nausea and vomiting)     HOSPITAL COURSE:   48 year old female with past medical history of recently diagnosed AML, pancytopenia, depression, history of migraines, GERD who presented to the hospital suspected sepsis from infected Hickman catheter.  1.  Suspected sepsis from infected Hickman catheter- patient was admitted to the hospital started on broad-spectrum IV antibiotics with vancomycin, cefepime. - A vascular surgery consult was obtained, her Hickman catheter has now been removed.  Patient's blood cultures remain negative, she is afebrile and hemodynamically stable.  Discussed with oncology and stable to be discharged on oral doxycycline for 2 weeks with follow-up with her oncologist next week.   2.  Pancytopenia- secondary to underlying acute leukemia and also patient receiving chemotherapy. -Patient was transfused irradiated platelets and also irradiated blood and her platelet count and her hemoglobin has significantly improved.  She is clinically asymptomatic.  She will follow-up with her oncologist for  serial counts.  3.  AML-patient last received chemotherapy 7 to 10 days ago.  Plan for is for repeat bone marrow biopsy later this upcoming week.  Patient will follow-up with her oncologist Dr. Tasia Catchings.  She will also follow-up with her bone marrow team at [facility] oncology.  4.  Essential hypertension-patient will continue carvedilol, losartan.   5.  GERD-patient will continue her Protonix  6.  Anxiety- patient will continue Effexor, BuSpar.  DISCHARGE CONDITIONS:   Stable  CONSULTS OBTAINED:    DRUG ALLERGIES:  No Known Allergies  DISCHARGE MEDICATIONS:   Allergies as of 02/03/2018   No Known Allergies     Medication List    STOP taking these medications   levofloxacin 500 MG tablet Commonly known as:  LEVAQUIN     TAKE these medications   busPIRone 5 MG tablet Commonly known as:  BUSPAR Take 5 mg by mouth 2 (two) times daily.   carvedilol 6.25 MG tablet Commonly known as:  COREG Take 6.25 mg by mouth 2 (two) times daily.   doxycycline 100 MG tablet Commonly known as:  VIBRA-TABS Take 1 tablet (100 mg total) by mouth 2 (two) times daily for 14 days.   furosemide 20 MG tablet Commonly known as:  LASIX Take 20 mg by mouth daily.   ibuprofen 600 MG tablet Commonly known as:  ADVIL,MOTRIN Take 600 mg by mouth daily as needed.   losartan 25 MG tablet Commonly known as:  COZAAR Take 12.5 mg by mouth daily.   pantoprazole 40 MG tablet Commonly known as:  PROTONIX Take 40 mg by mouth daily.   posaconazole 100 MG Tbec delayed-release tablet Commonly  known as:  NOXAFIL Take 300 mg by mouth every 6 (six) hours as needed.   prochlorperazine 5 MG tablet Commonly known as:  COMPAZINE Take 5 mg by mouth every 8 (eight) hours as needed for nausea or vomiting.   traZODone 50 MG tablet Commonly known as:  DESYREL Take 50 mg by mouth daily as needed.   valACYclovir 500 MG tablet Commonly known as:  VALTREX Take 500 mg by mouth daily.   venlafaxine 100 MG  tablet Commonly known as:  EFFEXOR Take 300 mg by mouth daily. 300 MG         DISCHARGE INSTRUCTIONS:   DIET:  Cardiac diet  DISCHARGE CONDITION:  Stable  ACTIVITY:  Activity as tolerated  OXYGEN:  Home Oxygen: No.   Oxygen Delivery: room air  DISCHARGE LOCATION:  home   If you experience worsening of your admission symptoms, develop shortness of breath, life threatening emergency, suicidal or homicidal thoughts you must seek medical attention immediately by calling 911 or calling your MD immediately  if symptoms less severe.  You Must read complete instructions/literature along with all the possible adverse reactions/side effects for all the Medicines you take and that have been prescribed to you. Take any new Medicines after you have completely understood and accpet all the possible adverse reactions/side effects.   Please note  You were cared for by a hospitalist during your hospital stay. If you have any questions about your discharge medications or the care you received while you were in the hospital after you are discharged, you can call the unit and asked to speak with the hospitalist on call if the hospitalist that took care of you is not available. Once you are discharged, your primary care physician will handle any further medical issues. Please note that NO REFILLS for any discharge medications will be authorized once you are discharged, as it is imperative that you return to your primary care physician (or establish a relationship with a primary care physician if you do not have one) for your aftercare needs so that they can reassess your need for medications and monitor your lab values.     Today   No acute events overnight.  Hemoglobin improved posttransfusion.  Platelet count is also improved.  No acute bleeding.  Seen by vascular surgery and Hickman catheter removed today.  Tip has been sent for culture.  Blood cultures remain negative.  SHe is stable to be  discharged on oral antibiotics.  VITAL SIGNS:  Blood pressure (!) 157/80, pulse 78, temperature 99.2 F (37.3 C), temperature source Oral, resp. rate 18, height 5' 4.5" (1.638 m), weight 96.3 kg, SpO2 98 %.  I/O:    Intake/Output Summary (Last 24 hours) at 02/03/2018 1220 Last data filed at 02/03/2018 0645 Gross per 24 hour  Intake 2376.67 ml  Output -  Net 2376.67 ml    PHYSICAL EXAMINATION:  GENERAL:  48 y.o.-year-old patient lying in the bed with no acute distress.  EYES: Pupils equal, round, reactive to light and accommodation. No scleral icterus. Extraocular muscles intact.  HEENT: Head atraumatic, normocephalic. Oropharynx and nasopharynx clear.  NECK:  Supple, no jugular venous distention. No thyroid enlargement, no tenderness.  LUNGS: Normal breath sounds bilaterally, no wheezing, rales,rhonchi. No use of accessory muscles of respiration.  CARDIOVASCULAR: S1, S2 normal. No murmurs, rubs, or gallops.  Right chest wall dressing in place from recent Hickman catheter removal. ABDOMEN: Soft, non-tender, non-distended. Bowel sounds present. No organomegaly or mass.  EXTREMITIES: No pedal edema,  cyanosis, or clubbing.  NEUROLOGIC: Cranial nerves II through XII are intact. No focal motor or sensory defecits b/l.  PSYCHIATRIC: The patient is alert and oriented x 3.  SKIN: No obvious rash, lesion, or ulcer.   DATA REVIEW:   CBC Recent Labs  Lab 02/03/18 0757  WBC 1.7*  HGB 8.5*  HCT 24.1*  PLT 31*    Chemistries  Recent Labs  Lab 02/01/18 2211  02/03/18 0757  NA 139   < > 138  K 3.4*   < > 3.2*  CL 107   < > 105  CO2 24   < > 25  GLUCOSE 106*   < > 104*  BUN 12   < > 16  CREATININE 1.28*   < > 1.37*  CALCIUM 8.6*   < > 8.6*  MG 1.6*  --  1.6*  AST 39  --   --   ALT 56*  --   --   ALKPHOS 89  --   --   BILITOT 0.9  --   --    < > = values in this interval not displayed.    Cardiac Enzymes Recent Labs  Lab 01/31/18 1344  TROPONINI 0.05*    Microbiology  Results  Results for orders placed or performed during the hospital encounter of 01/31/18  Blood Culture (routine x 2)     Status: None (Preliminary result)   Collection Time: 01/31/18  1:44 PM  Result Value Ref Range Status   Specimen Description BLOOD RIGHT ANTECUBITAL  Final   Special Requests   Final    BOTTLES DRAWN AEROBIC AND ANAEROBIC Blood Culture adequate volume   Culture   Final    NO GROWTH 3 DAYS Performed at [facility], [address], [place], [place] [address]    Report Status PENDING  Incomplete  Blood Culture (routine x 2)     Status: None (Preliminary result)   Collection Time: 01/31/18  2:15 PM  Result Value Ref Range Status   Specimen Description BLOOD RIGHT ANTECUBITAL  Final   Special Requests   Final    BOTTLES DRAWN AEROBIC AND ANAEROBIC Blood Culture results may not be optimal due to an excessive volume of blood received in culture bottles   Culture   Final    NO GROWTH 3 DAYS Performed at [facility], [address] [place], [place] [address]    Report Status PENDING  Incomplete  Urine culture     Status: Abnormal   Collection Time: 02/01/18  3:55 AM  Result Value Ref Range Status   Specimen Description   Final    URINE, RANDOM Performed at [facility], [address], [place], [place] [address]    Special Requests   Final    NONE Performed at [facility], [address] [place], [place] [address]    Culture MULTIPLE SPECIES PRESENT, SUGGEST RECOLLECTION (A)  Final   Report Status 02/02/2018 FINAL  Final    RADIOLOGY:  No results found.    Management plans discussed with the patient, family and they are in agreement.  CODE STATUS:     Code Status Orders  (From admission, onward)         Start     Ordered   02/02/18 1610  Full code  Continuous     02/02/18 1610        TOTAL TIME TAKING CARE OF THIS PATIENT: 40 minutes.    Henreitta Leber M.D on 02/03/2018  at 12:20 PM  Between 7am  to 6pm - Pager - [phone]  After 6pm go to [email or web address] - Proofreader  [facility]  Office  [phone]  CC: Primary care physician; [place] Pink, MD

## 2018-02-04 ENCOUNTER — Other Ambulatory Visit: Payer: Self-pay | Admitting: Oncology

## 2018-02-04 ENCOUNTER — Inpatient Hospital Stay: Payer: 59

## 2018-02-04 DIAGNOSIS — D696 Thrombocytopenia, unspecified: Secondary | ICD-10-CM | POA: Diagnosis not present

## 2018-02-04 DIAGNOSIS — I427 Cardiomyopathy due to drug and external agent: Secondary | ICD-10-CM | POA: Diagnosis not present

## 2018-02-04 DIAGNOSIS — C9500 Acute leukemia of unspecified cell type not having achieved remission: Secondary | ICD-10-CM | POA: Diagnosis not present

## 2018-02-04 DIAGNOSIS — Z7189 Other specified counseling: Secondary | ICD-10-CM | POA: Diagnosis not present

## 2018-02-04 DIAGNOSIS — C9200 Acute myeloblastic leukemia, not having achieved remission: Secondary | ICD-10-CM | POA: Diagnosis not present

## 2018-02-04 DIAGNOSIS — D6481 Anemia due to antineoplastic chemotherapy: Secondary | ICD-10-CM | POA: Diagnosis not present

## 2018-02-04 LAB — COMPREHENSIVE METABOLIC PANEL
ALT: 52 U/L — ABNORMAL HIGH (ref 0–44)
ANION GAP: 9 (ref 5–15)
AST: 41 U/L (ref 15–41)
Albumin: 3.7 g/dL (ref 3.5–5.0)
Alkaline Phosphatase: 94 U/L (ref 38–126)
BUN: 18 mg/dL (ref 6–20)
CO2: 26 mmol/L (ref 22–32)
Calcium: 9.2 mg/dL (ref 8.9–10.3)
Chloride: 105 mmol/L (ref 98–111)
Creatinine, Ser: 1.39 mg/dL — ABNORMAL HIGH (ref 0.44–1.00)
GFR calc Af Amer: 52 mL/min — ABNORMAL LOW (ref >60)
GFR calc non Af Amer: 45 mL/min — ABNORMAL LOW (ref >60)
Glucose, Bld: 166 mg/dL — ABNORMAL HIGH (ref 70–99)
Potassium: 3 mmol/L — ABNORMAL LOW (ref 3.5–5.1)
Sodium: 140 mmol/L (ref 135–145)
TOTAL PROTEIN: 7.6 g/dL (ref 6.5–8.1)
Total Bilirubin: 0.8 mg/dL (ref 0.3–1.2)

## 2018-02-04 LAB — CBC WITH DIFFERENTIAL/PLATELET
Abs Immature Granulocytes: 0.01 10*3/uL (ref 0.00–0.07)
Basophils Absolute: 0 10*3/uL (ref 0.0–0.1)
Basophils Relative: 0 %
Eosinophils Absolute: 0 10*3/uL (ref 0.0–0.5)
Eosinophils Relative: 0 %
HCT: 27 % — ABNORMAL LOW (ref 36.0–46.0)
Hemoglobin: 9.2 g/dL — ABNORMAL LOW (ref 12.0–15.0)
IMMATURE GRANULOCYTES: 1 %
Lymphocytes Relative: 40 %
Lymphs Abs: 0.6 10*3/uL — ABNORMAL LOW (ref 0.7–4.0)
MCH: 28.1 pg (ref 26.0–34.0)
MCHC: 34.1 g/dL (ref 30.0–36.0)
MCV: 82.6 fL (ref 80.0–100.0)
Monocytes Absolute: 0.8 10*3/uL (ref 0.1–1.0)
Monocytes Relative: 56 %
NEUTROS ABS: 0 10*3/uL — AB (ref 1.7–7.7)
Neutrophils Relative %: 3 %
Platelets: 23 10*3/uL — CL (ref 150–400)
RBC: 3.27 MIL/uL — ABNORMAL LOW (ref 3.87–5.11)
RDW: 13.8 % (ref 11.5–15.5)
SMEAR REVIEW: DECREASED
WBC: 1.5 10*3/uL — ABNORMAL LOW (ref 4.0–10.5)
nRBC: 0 % (ref 0.0–0.2)

## 2018-02-04 LAB — TYPE AND SCREEN
ABO/RH(D): O POS
Antibody Screen: NEGATIVE
Unit division: 0
Unit division: 0
Unit division: 0
Unit division: 0

## 2018-02-04 LAB — BPAM RBC
BLOOD PRODUCT EXPIRATION DATE: 202001212359
BLOOD PRODUCT EXPIRATION DATE: 202001242359
Blood Product Expiration Date: 202001242359
Blood Product Expiration Date: 202001282359
ISSUE DATE / TIME: 202001182355
ISSUE DATE / TIME: 202001190328
Unit Type and Rh: 5100
Unit Type and Rh: 5100
Unit Type and Rh: 9500
Unit Type and Rh: 9500

## 2018-02-04 LAB — SAMPLE TO BLOOD BANK

## 2018-02-04 MED ORDER — POTASSIUM CHLORIDE CRYS ER 20 MEQ PO TBCR: 20.0000 meq | EXTENDED_RELEASE_TABLET | Freq: Every day | ORAL | 0 refills | Status: DC

## 2018-02-05 DIAGNOSIS — C9500 Acute leukemia of unspecified cell type not having achieved remission: Secondary | ICD-10-CM | POA: Diagnosis not present

## 2018-02-05 LAB — CULTURE, BLOOD (ROUTINE X 2)
Culture: NO GROWTH
Culture: NO GROWTH
Special Requests: ADEQUATE

## 2018-02-06 ENCOUNTER — Inpatient Hospital Stay: Payer: 59

## 2018-02-06 ENCOUNTER — Inpatient Hospital Stay: Payer: 59 | Admitting: Oncology

## 2018-02-06 DIAGNOSIS — C9500 Acute leukemia of unspecified cell type not having achieved remission: Secondary | ICD-10-CM | POA: Diagnosis not present

## 2018-02-06 LAB — CATH TIP CULTURE: Culture: NO GROWTH

## 2018-02-07 ENCOUNTER — Other Ambulatory Visit: Payer: Self-pay

## 2018-02-07 ENCOUNTER — Inpatient Hospital Stay: Payer: 59

## 2018-02-07 ENCOUNTER — Other Ambulatory Visit: Payer: Self-pay | Admitting: Oncology

## 2018-02-07 ENCOUNTER — Encounter: Payer: Self-pay | Admitting: Oncology

## 2018-02-07 ENCOUNTER — Other Ambulatory Visit: Payer: Self-pay | Admitting: *Deleted

## 2018-02-07 ENCOUNTER — Inpatient Hospital Stay (HOSPITAL_BASED_OUTPATIENT_CLINIC_OR_DEPARTMENT_OTHER): Payer: 59 | Admitting: Oncology

## 2018-02-07 VITALS — BP 124/78 | HR 76 | Temp 97.8°F | Ht 64.5 in | Wt 213.4 lb

## 2018-02-07 DIAGNOSIS — D696 Thrombocytopenia, unspecified: Secondary | ICD-10-CM

## 2018-02-07 DIAGNOSIS — I427 Cardiomyopathy due to drug and external agent: Secondary | ICD-10-CM

## 2018-02-07 DIAGNOSIS — C9500 Acute leukemia of unspecified cell type not having achieved remission: Secondary | ICD-10-CM | POA: Diagnosis not present

## 2018-02-07 DIAGNOSIS — C9200 Acute myeloblastic leukemia, not having achieved remission: Secondary | ICD-10-CM | POA: Diagnosis not present

## 2018-02-07 DIAGNOSIS — T451X5A Adverse effect of antineoplastic and immunosuppressive drugs, initial encounter: Secondary | ICD-10-CM

## 2018-02-07 DIAGNOSIS — D6481 Anemia due to antineoplastic chemotherapy: Secondary | ICD-10-CM

## 2018-02-07 DIAGNOSIS — D701 Agranulocytosis secondary to cancer chemotherapy: Secondary | ICD-10-CM | POA: Diagnosis not present

## 2018-02-07 LAB — CBC WITH DIFFERENTIAL/PLATELET
Abs Immature Granulocytes: 0 10*3/uL (ref 0.00–0.07)
BASOS ABS: 0 10*3/uL (ref 0.0–0.1)
Basophils Relative: 0 %
Eosinophils Absolute: 0 10*3/uL (ref 0.0–0.5)
Eosinophils Relative: 0 %
HCT: 22.3 % — ABNORMAL LOW (ref 36.0–46.0)
Hemoglobin: 7.6 g/dL — ABNORMAL LOW (ref 12.0–15.0)
Immature Granulocytes: 0 %
LYMPHS ABS: 0.7 10*3/uL (ref 0.7–4.0)
LYMPHS PCT: 50 %
MCH: 28.4 pg (ref 26.0–34.0)
MCHC: 34.1 g/dL (ref 30.0–36.0)
MCV: 83.2 fL (ref 80.0–100.0)
Monocytes Absolute: 0.6 10*3/uL (ref 0.1–1.0)
Monocytes Relative: 47 %
Neutro Abs: 0 10*3/uL — ABNORMAL LOW (ref 1.7–7.7)
Neutrophils Relative %: 3 %
PLATELETS: 12 10*3/uL — AB (ref 150–400)
RBC: 2.68 MIL/uL — ABNORMAL LOW (ref 3.87–5.11)
RDW: 12.8 % (ref 11.5–15.5)
WBC Morphology: ABNORMAL
WBC: 1.4 10*3/uL — CL (ref 4.0–10.5)
nRBC: 0 % (ref 0.0–0.2)

## 2018-02-07 LAB — COMPREHENSIVE METABOLIC PANEL
ALT: 59 U/L — ABNORMAL HIGH (ref 0–44)
AST: 47 U/L — ABNORMAL HIGH (ref 15–41)
Albumin: 3.7 g/dL (ref 3.5–5.0)
Alkaline Phosphatase: 86 U/L (ref 38–126)
Anion gap: 7 (ref 5–15)
BUN: 14 mg/dL (ref 6–20)
CO2: 26 mmol/L (ref 22–32)
CREATININE: 1.23 mg/dL — AB (ref 0.44–1.00)
Calcium: 9.1 mg/dL (ref 8.9–10.3)
Chloride: 108 mmol/L (ref 98–111)
GFR calc Af Amer: >60 mL/min (ref >60)
GFR calc non Af Amer: 52 mL/min — ABNORMAL LOW (ref >60)
Glucose, Bld: 134 mg/dL — ABNORMAL HIGH (ref 70–99)
Potassium: 3.9 mmol/L (ref 3.5–5.1)
SODIUM: 141 mmol/L (ref 135–145)
Total Bilirubin: 0.7 mg/dL (ref 0.3–1.2)
Total Protein: 7.1 g/dL (ref 6.5–8.1)

## 2018-02-07 LAB — TYPE AND SCREEN
ABO/RH(D): O POS
Antibody Screen: NEGATIVE

## 2018-02-07 LAB — SAMPLE TO BLOOD BANK

## 2018-02-07 MED ORDER — ACETAMINOPHEN 325 MG PO TABS
650.0000 mg | ORAL_TABLET | Freq: Once | ORAL | Status: AC
  Administered 2018-02-07: 650 mg via ORAL
  Filled 2018-02-07: qty 2

## 2018-02-07 MED ORDER — SODIUM CHLORIDE 0.9% IV SOLUTION
250.0000 mL | Freq: Once | INTRAVENOUS | Status: AC
  Administered 2018-02-07: 250 mL via INTRAVENOUS
  Filled 2018-02-07: qty 250

## 2018-02-07 MED ORDER — DIPHENHYDRAMINE HCL 25 MG PO CAPS
25.0000 mg | ORAL_CAPSULE | Freq: Once | ORAL | Status: AC
  Administered 2018-02-07: 25 mg via ORAL
  Filled 2018-02-07: qty 1

## 2018-02-07 NOTE — Progress Notes (Signed)
Patient here today for follow up.   

## 2018-02-07 NOTE — Patient Instructions (Signed)
Decitabine injection for infusion  What is this medicine?  DECITABINE (dee SYE ta been) is a chemotherapy drug. This medicine reduces the growth of cancer cells. It is used to treat adults with myelodysplastic syndromes.  This medicine may be used for other purposes; ask your health care provider or pharmacist if you have questions.  COMMON BRAND NAME(S): Dacogen  What should I tell my health care provider before I take this medicine?  They need to know if you have any of these conditions:  -infection (especially a virus infection such as chickenpox, cold sores, or herpes)  -kidney disease  -liver disease  -an unusual or allergic reaction to decitabine, other medicines, foods, dyes, or preservatives  -pregnant or trying to get pregnant  -breast-feeding  How should I use this medicine?  This medicine is for infusion into a vein. It is administered in a hospital or clinic by a doctor or health care professional.  Talk to your pediatrician regarding the use of this medicine in children. Special care may be needed.  Overdosage: If you think you have taken too much of this medicine contact a poison control center or emergency room at once.  NOTE: This medicine is only for you. Do not share this medicine with others.  What if I miss a dose?  It is important not to miss your dose. Call your doctor or health care professional if you are unable to keep an appointment.  What may interact with this medicine?  -vaccines  Talk to your doctor or health care professional before taking any of these medicines:  -aspirin  -acetaminophen  -ibuprofen  -ketoprofen  -naproxen  This list may not describe all possible interactions. Give your health care provider a list of all the medicines, herbs, non-prescription drugs, or dietary supplements you use. Also tell them if you smoke, drink alcohol, or use illegal drugs. Some items may interact with your medicine.  What should I watch for while using this medicine?  Visit your doctor for  checks on your progress. This drug may make you feel generally unwell. This is not uncommon, as chemotherapy can affect healthy cells as well as cancer cells. Report any side effects. Continue your course of treatment even though you feel ill unless your doctor tells you to stop.  You may need blood work done while you are taking this medicine.  In some cases, you may be given additional medicines to help with side effects. Follow all directions for their use.  Call your doctor or health care professional for advice if you get a fever, chills or sore throat, or other symptoms of a cold or flu. Do not treat yourself. This drug decreases your body's ability to fight infections. Try to avoid being around people who are sick.  This medicine may increase your risk to bruise or bleed. Call your doctor or health care professional if you notice any unusual bleeding.  Do not become pregnant while taking this medicine or for 6 months after stopping it. Women should inform their doctor if they wish to become pregnant or think they might be pregnant. Men should not father a child while taking this medicine and for 3 months after stopping it. There is a potential for serious side effects to an unborn child. Talk to your health care professional or pharmacist for more information. Do not breast-feed an infant while taking this medicine or for 1 week after stopping it.  In males, this medicine may interfere with the ability to father   a child. Talk with your doctor or health care professional if you are concerned about your fertility.  What side effects may I notice from receiving this medicine?  Side effects that you should report to your doctor or health care professional as soon as possible:  -low blood counts - this medicine may decrease the number of white blood cells, red blood cells and platelets. You may be at increased risk for infections and bleeding.  -signs of infection - fever or chills, cough, sore throat, pain or  difficulty passing urine  -signs of decreased platelets or bleeding - bruising, pinpoint red spots on the skin, black, tarry stools, blood in the urine  -signs of decreased red blood cells - unusual weakness or tiredness, fainting spells, lightheadedness  -increased blood sugar  Side effects that usually do not require medical attention (report to your doctor or health care professional if they continue or are bothersome):  -constipation  -diarrhea  -headache  -loss of appetite  -nausea, vomiting  -skin rash, itching  -stomach pain  -water retention  -weak or tired  This list may not describe all possible side effects. Call your doctor for medical advice about side effects. You may report side effects to FDA at 1-800-FDA-1088.  Where should I keep my medicine?  This drug is given in a hospital or clinic and will not be stored at home.  NOTE: This sheet is a summary. It may not cover all possible information. If you have questions about this medicine, talk to your doctor, pharmacist, or health care provider.   2019 Elsevier/Gold Standard (2017-06-08 14:37:23)
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
## 2018-02-08 ENCOUNTER — Inpatient Hospital Stay: Payer: 59

## 2018-02-08 DIAGNOSIS — C9500 Acute leukemia of unspecified cell type not having achieved remission: Secondary | ICD-10-CM | POA: Diagnosis not present

## 2018-02-08 LAB — BPAM PLATELET PHERESIS
Blood Product Expiration Date: 202001262359
ISSUE DATE / TIME: 202001231151
Unit Type and Rh: 5100

## 2018-02-08 LAB — PREPARE PLATELET PHERESIS: Unit division: 0

## 2018-02-09 ENCOUNTER — Encounter: Payer: Self-pay | Admitting: Oncology

## 2018-02-09 DIAGNOSIS — C9500 Acute leukemia of unspecified cell type not having achieved remission: Secondary | ICD-10-CM | POA: Diagnosis not present

## 2018-02-09 DIAGNOSIS — Z7189 Other specified counseling: Secondary | ICD-10-CM | POA: Insufficient documentation

## 2018-02-10 DIAGNOSIS — C9500 Acute leukemia of unspecified cell type not having achieved remission: Secondary | ICD-10-CM | POA: Diagnosis not present

## 2018-02-11 ENCOUNTER — Other Ambulatory Visit: Payer: Self-pay | Admitting: Oncology

## 2018-02-11 ENCOUNTER — Inpatient Hospital Stay: Payer: 59

## 2018-02-11 DIAGNOSIS — C9500 Acute leukemia of unspecified cell type not having achieved remission: Secondary | ICD-10-CM | POA: Diagnosis not present

## 2018-02-11 DIAGNOSIS — C9200 Acute myeloblastic leukemia, not having achieved remission: Secondary | ICD-10-CM

## 2018-02-11 DIAGNOSIS — D649 Anemia, unspecified: Secondary | ICD-10-CM

## 2018-02-11 LAB — CBC WITH DIFFERENTIAL/PLATELET
Band Neutrophils: 0 %
Basophils Absolute: 0 10*3/uL (ref 0.0–0.1)
Basophils Relative: 0 %
Blasts: 0 %
Eosinophils Absolute: 0 10*3/uL (ref 0.0–0.5)
Eosinophils Relative: 0 %
HCT: 18.8 % — ABNORMAL LOW (ref 36.0–46.0)
Hemoglobin: 6.3 g/dL — ABNORMAL LOW (ref 12.0–15.0)
LYMPHS PCT: 40 %
Lymphs Abs: 0.4 10*3/uL — ABNORMAL LOW (ref 0.7–4.0)
MCH: 28.3 pg (ref 26.0–34.0)
MCHC: 33.5 g/dL (ref 30.0–36.0)
MCV: 84.3 fL (ref 80.0–100.0)
Metamyelocytes Relative: 0 %
Monocytes Absolute: 0.7 10*3/uL (ref 0.1–1.0)
Monocytes Relative: 56 %
Myelocytes: 0 %
NRBC: 0 /100{WBCs}
Neutro Abs: 0 10*3/uL — ABNORMAL LOW (ref 1.7–7.7)
Neutrophils Relative %: 4 %
OTHER: 0 %
Platelets: 34 10*3/uL — ABNORMAL LOW (ref 150–400)
Promyelocytes Relative: 0 %
RBC: 2.23 MIL/uL — ABNORMAL LOW (ref 3.87–5.11)
RDW: 12.4 % (ref 11.5–15.5)
Smear Review: DECREASED
WBC: 1.1 10*3/uL — CL (ref 4.0–10.5)
nRBC: 0 % (ref 0.0–0.2)

## 2018-02-11 LAB — PREPARE RBC (CROSSMATCH)

## 2018-02-11 LAB — SAMPLE TO BLOOD BANK

## 2018-02-11 LAB — PATHOLOGIST SMEAR REVIEW

## 2018-02-11 MED ORDER — DIPHENHYDRAMINE HCL 25 MG PO CAPS
25.0000 mg | ORAL_CAPSULE | Freq: Once | ORAL | Status: AC
  Administered 2018-02-11: 25 mg via ORAL
  Filled 2018-02-11: qty 1

## 2018-02-11 MED ORDER — ACETAMINOPHEN 325 MG PO TABS
650.0000 mg | ORAL_TABLET | Freq: Once | ORAL | Status: AC
  Administered 2018-02-11: 650 mg via ORAL
  Filled 2018-02-11: qty 2

## 2018-02-11 MED ORDER — SODIUM CHLORIDE 0.9% IV SOLUTION
250.0000 mL | Freq: Once | INTRAVENOUS | Status: AC
  Administered 2018-02-11: 250 mL via INTRAVENOUS
  Filled 2018-02-11: qty 250

## 2018-02-12 DIAGNOSIS — C9500 Acute leukemia of unspecified cell type not having achieved remission: Secondary | ICD-10-CM | POA: Diagnosis not present

## 2018-02-12 DIAGNOSIS — C9200 Acute myeloblastic leukemia, not having achieved remission: Secondary | ICD-10-CM | POA: Diagnosis not present

## 2018-02-12 LAB — TYPE AND SCREEN
ABO/RH(D): O POS
Antibody Screen: NEGATIVE
Unit division: 0

## 2018-02-12 LAB — BPAM RBC
Blood Product Expiration Date: 202001282359
ISSUE DATE / TIME: 202001271025
Unit Type and Rh: 9500

## 2018-02-13 DIAGNOSIS — C9500 Acute leukemia of unspecified cell type not having achieved remission: Secondary | ICD-10-CM | POA: Diagnosis not present

## 2018-02-14 ENCOUNTER — Ambulatory Visit: Payer: 59

## 2018-02-14 ENCOUNTER — Inpatient Hospital Stay: Payer: 59

## 2018-02-14 ENCOUNTER — Encounter: Payer: Self-pay | Admitting: Oncology

## 2018-02-14 ENCOUNTER — Inpatient Hospital Stay (HOSPITAL_BASED_OUTPATIENT_CLINIC_OR_DEPARTMENT_OTHER): Payer: 59 | Admitting: Oncology

## 2018-02-14 ENCOUNTER — Other Ambulatory Visit: Payer: Self-pay

## 2018-02-14 VITALS — BP 150/94 | HR 81 | Temp 98.3°F | Resp 18 | Wt 215.0 lb

## 2018-02-14 DIAGNOSIS — C9200 Acute myeloblastic leukemia, not having achieved remission: Secondary | ICD-10-CM | POA: Diagnosis not present

## 2018-02-14 DIAGNOSIS — D696 Thrombocytopenia, unspecified: Secondary | ICD-10-CM | POA: Diagnosis not present

## 2018-02-14 DIAGNOSIS — I427 Cardiomyopathy due to drug and external agent: Secondary | ICD-10-CM | POA: Insufficient documentation

## 2018-02-14 DIAGNOSIS — D6481 Anemia due to antineoplastic chemotherapy: Secondary | ICD-10-CM | POA: Diagnosis not present

## 2018-02-14 DIAGNOSIS — C9500 Acute leukemia of unspecified cell type not having achieved remission: Secondary | ICD-10-CM | POA: Diagnosis not present

## 2018-02-14 DIAGNOSIS — T451X5A Adverse effect of antineoplastic and immunosuppressive drugs, initial encounter: Secondary | ICD-10-CM | POA: Insufficient documentation

## 2018-02-14 DIAGNOSIS — Z7189 Other specified counseling: Secondary | ICD-10-CM

## 2018-02-14 LAB — CBC WITH DIFFERENTIAL/PLATELET
Abs Immature Granulocytes: 0.01 10*3/uL (ref 0.00–0.07)
BASOS ABS: 0 10*3/uL (ref 0.0–0.1)
Basophils Relative: 1 %
Eosinophils Absolute: 0 10*3/uL (ref 0.0–0.5)
Eosinophils Relative: 0 %
HEMATOCRIT: 28 % — AB (ref 36.0–46.0)
Hemoglobin: 9.4 g/dL — ABNORMAL LOW (ref 12.0–15.0)
Immature Granulocytes: 1 %
Lymphocytes Relative: 34 %
Lymphs Abs: 0.5 10*3/uL — ABNORMAL LOW (ref 0.7–4.0)
MCH: 28.1 pg (ref 26.0–34.0)
MCHC: 33.6 g/dL (ref 30.0–36.0)
MCV: 83.8 fL (ref 80.0–100.0)
MONO ABS: 0.9 10*3/uL (ref 0.1–1.0)
Monocytes Relative: 60 %
Neutro Abs: 0.1 10*3/uL — ABNORMAL LOW (ref 1.7–7.7)
Neutrophils Relative %: 4 %
PLATELETS: 99 10*3/uL — AB (ref 150–400)
RBC: 3.34 MIL/uL — AB (ref 3.87–5.11)
RDW: 13.2 % (ref 11.5–15.5)
Smear Review: DECREASED
WBC Morphology: ABNORMAL
WBC: 1.4 10*3/uL — AB (ref 4.0–10.5)
nRBC: 0 % (ref 0.0–0.2)

## 2018-02-14 LAB — COMPREHENSIVE METABOLIC PANEL
ALT: 49 U/L — AB (ref 0–44)
AST: 35 U/L (ref 15–41)
Albumin: 3.7 g/dL (ref 3.5–5.0)
Alkaline Phosphatase: 103 U/L (ref 38–126)
Anion gap: 12 (ref 5–15)
BUN: 10 mg/dL (ref 6–20)
CHLORIDE: 105 mmol/L (ref 98–111)
CO2: 22 mmol/L (ref 22–32)
Calcium: 8.4 mg/dL — ABNORMAL LOW (ref 8.9–10.3)
Creatinine, Ser: 1.34 mg/dL — ABNORMAL HIGH (ref 0.44–1.00)
GFR calc Af Amer: 55 mL/min — ABNORMAL LOW (ref >60)
GFR calc non Af Amer: 47 mL/min — ABNORMAL LOW (ref >60)
Glucose, Bld: 170 mg/dL — ABNORMAL HIGH (ref 70–99)
Potassium: 3.2 mmol/L — ABNORMAL LOW (ref 3.5–5.1)
Sodium: 139 mmol/L (ref 135–145)
Total Bilirubin: 0.7 mg/dL (ref 0.3–1.2)
Total Protein: 7.1 g/dL (ref 6.5–8.1)

## 2018-02-14 LAB — SAMPLE TO BLOOD BANK

## 2018-02-14 NOTE — Progress Notes (Signed)
START OFF PATHWAY REGIMEN - Other Dx   [id number]:Decitabine:   A cycle is every 28 days:     Decitabine   **Always confirm dose/schedule in your pharmacy ordering system**  Patient Characteristics: Intent of Therapy: Non-Curative / Palliative Intent, Discussed with Patient
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
## 2018-02-14 NOTE — Progress Notes (Signed)
Patient here for follow up. No concerns, states she has been feeling  "ok."

## 2018-02-15 ENCOUNTER — Inpatient Hospital Stay: Payer: 59

## 2018-02-15 ENCOUNTER — Ambulatory Visit
Admission: RE | Admit: 2018-02-15 | Discharge: 2018-02-15 | Disposition: A | Discharge status: Home / Self Care | Payer: Self-pay | Source: Ambulatory Visit | Attending: Oncology | Admitting: Oncology

## 2018-02-15 ENCOUNTER — Ambulatory Visit
Admission: RE | Admit: 2018-02-15 | Discharge: 2018-02-15 | Disposition: A | Discharge status: Home / Self Care | Payer: 59 | Source: Ambulatory Visit | Attending: Oncology | Admitting: Oncology

## 2018-02-15 DIAGNOSIS — D696 Thrombocytopenia, unspecified: Secondary | ICD-10-CM | POA: Insufficient documentation

## 2018-02-15 DIAGNOSIS — C9500 Acute leukemia of unspecified cell type not having achieved remission: Secondary | ICD-10-CM | POA: Diagnosis not present

## 2018-02-15 MED ORDER — SODIUM CHLORIDE 0.9% FLUSH: 10.0000 mL | INTRAVENOUS | Status: DC | PRN

## 2018-02-15 MED ORDER — SODIUM CHLORIDE 0.9% FLUSH
10.0000 mL | INTRAVENOUS | Status: AC | PRN
  Administered 2018-02-15: 10 mL

## 2018-02-15 MED ORDER — HEPARIN SOD (PORK) LOCK FLUSH 100 UNIT/ML IV SOLN
500.0000 [IU] | Freq: Every day | INTRAVENOUS | Status: AC | PRN
  Administered 2018-02-15: 500 [IU]

## 2018-02-15 MED ORDER — HEPARIN SOD (PORK) LOCK FLUSH 100 UNIT/ML IV SOLN
INTRAVENOUS | Status: AC
  Administered 2018-02-15: 500 [IU]
  Filled 2018-02-15: qty 5

## 2018-02-15 MED ORDER — SODIUM CHLORIDE 0.9% FLUSH: 10.0000 mL | Freq: Two times a day (BID) | INTRAVENOUS | Status: DC

## 2018-02-15 MED ORDER — SODIUM CHLORIDE FLUSH 0.9 % IV SOLN
INTRAVENOUS | Status: AC
  Administered 2018-02-15: 10 mL
  Filled 2018-02-15: qty 10

## 2018-02-15 NOTE — OR Nursing (Signed)
Patient tolerated PICC insertion without any complaints. Line flushed as per protocol.  Instructed patient to review literature provided by IV nurse. Ambulatory for discharge with husband. In good spirits.

## 2018-02-15 NOTE — Progress Notes (Signed)
Peripherally Inserted Central Catheter/Midline Placement  The IV Nurse has discussed with the patient and/or persons authorized to consent for the patient, the purpose of this procedure and the potential benefits and risks involved with this procedure.  The benefits include less needle sticks, lab draws from the catheter, and the patient may be discharged home with the catheter. Risks include, but not limited to, infection, bleeding, blood clot (thrombus formation), and puncture of an artery; nerve damage and irregular heartbeat and possibility to perform a PICC exchange if needed/ordered by physician.  Alternatives to this procedure were also discussed.  B[employer] Power PICC patient education guide, fact sheet on infection prevention and patient information card has been provided to patient /or left at bedside.    PICC/Midline Placement Documentation  PICC Single Lumen 38/33/38 PICC Right Basilic 40 cm 1 cm (Active)  Indication for Insertion or Continuance of Line Home intravenous therapies (PICC only) 02/15/2018  2:00 PM  Exposed Catheter (cm) 1 cm 02/15/2018  2:00 PM  Site Assessment Clean;Dry;Intact 02/15/2018  2:00 PM  Line Status Flushed;Blood return noted 02/15/2018  2:00 PM  Dressing Type Transparent 02/15/2018  2:00 PM  Dressing Status Clean;Dry;Intact;Antimicrobial disc in place 02/15/2018  2:00 PM  Dressing Change Due 02/22/18 02/15/2018  2:00 PM       Jule Economy Horton 02/15/2018, 2:40 PM

## 2018-02-16 DIAGNOSIS — C9500 Acute leukemia of unspecified cell type not having achieved remission: Secondary | ICD-10-CM | POA: Diagnosis not present

## 2018-02-17 DIAGNOSIS — C9500 Acute leukemia of unspecified cell type not having achieved remission: Secondary | ICD-10-CM | POA: Diagnosis not present

## 2018-02-18 ENCOUNTER — Inpatient Hospital Stay (HOSPITAL_BASED_OUTPATIENT_CLINIC_OR_DEPARTMENT_OTHER): Payer: 59 | Admitting: Oncology

## 2018-02-18 ENCOUNTER — Inpatient Hospital Stay: Payer: 59

## 2018-02-18 ENCOUNTER — Other Ambulatory Visit: Payer: 59

## 2018-02-18 ENCOUNTER — Encounter: Payer: Self-pay | Admitting: Oncology

## 2018-02-18 ENCOUNTER — Inpatient Hospital Stay: Payer: 59 | Attending: Oncology

## 2018-02-18 ENCOUNTER — Other Ambulatory Visit: Payer: Self-pay

## 2018-02-18 VITALS — BP 137/88 | HR 75 | Temp 97.9°F | Resp 18 | Wt 212.8 lb

## 2018-02-18 DIAGNOSIS — D696 Thrombocytopenia, unspecified: Secondary | ICD-10-CM

## 2018-02-18 DIAGNOSIS — D6481 Anemia due to antineoplastic chemotherapy: Secondary | ICD-10-CM | POA: Insufficient documentation

## 2018-02-18 DIAGNOSIS — I427 Cardiomyopathy due to drug and external agent: Secondary | ICD-10-CM

## 2018-02-18 DIAGNOSIS — C9200 Acute myeloblastic leukemia, not having achieved remission: Secondary | ICD-10-CM | POA: Insufficient documentation

## 2018-02-18 DIAGNOSIS — T451X5A Adverse effect of antineoplastic and immunosuppressive drugs, initial encounter: Secondary | ICD-10-CM

## 2018-02-18 DIAGNOSIS — Z7189 Other specified counseling: Secondary | ICD-10-CM

## 2018-02-18 DIAGNOSIS — Z5111 Encounter for antineoplastic chemotherapy: Secondary | ICD-10-CM | POA: Diagnosis present

## 2018-02-18 DIAGNOSIS — D709 Neutropenia, unspecified: Secondary | ICD-10-CM | POA: Insufficient documentation

## 2018-02-18 DIAGNOSIS — C9500 Acute leukemia of unspecified cell type not having achieved remission: Secondary | ICD-10-CM | POA: Diagnosis not present

## 2018-02-18 LAB — COMPREHENSIVE METABOLIC PANEL
ALBUMIN: 3.7 g/dL (ref 3.5–5.0)
ALT: 53 U/L — ABNORMAL HIGH (ref 0–44)
AST: 41 U/L (ref 15–41)
Alkaline Phosphatase: 117 U/L (ref 38–126)
Anion gap: 8 (ref 5–15)
BUN: 14 mg/dL (ref 6–20)
CO2: 24 mmol/L (ref 22–32)
Calcium: 8.2 mg/dL — ABNORMAL LOW (ref 8.9–10.3)
Chloride: 106 mmol/L (ref 98–111)
Creatinine, Ser: 1.36 mg/dL — ABNORMAL HIGH (ref 0.44–1.00)
GFR calc Af Amer: 54 mL/min — ABNORMAL LOW (ref >60)
GFR calc non Af Amer: 46 mL/min — ABNORMAL LOW (ref >60)
Glucose, Bld: 147 mg/dL — ABNORMAL HIGH (ref 70–99)
POTASSIUM: 3.4 mmol/L — AB (ref 3.5–5.1)
Sodium: 138 mmol/L (ref 135–145)
Total Bilirubin: 0.6 mg/dL (ref 0.3–1.2)
Total Protein: 7.2 g/dL (ref 6.5–8.1)

## 2018-02-18 LAB — CBC WITH DIFFERENTIAL/PLATELET
Abs Immature Granulocytes: 0 10*3/uL (ref 0.00–0.07)
BASOS PCT: 0 %
Basophils Absolute: 0 10*3/uL (ref 0.0–0.1)
Blasts: 7 %
Eosinophils Absolute: 0 10*3/uL (ref 0.0–0.5)
Eosinophils Relative: 0 %
HCT: 26.4 % — ABNORMAL LOW (ref 36.0–46.0)
Hemoglobin: 8.8 g/dL — ABNORMAL LOW (ref 12.0–15.0)
Lymphocytes Relative: 90 %
Lymphs Abs: 1.5 10*3/uL (ref 0.7–4.0)
MCH: 27.9 pg (ref 26.0–34.0)
MCHC: 33.3 g/dL (ref 30.0–36.0)
MCV: 83.8 fL (ref 80.0–100.0)
Monocytes Absolute: 0 10*3/uL — ABNORMAL LOW (ref 0.1–1.0)
Monocytes Relative: 2 %
Neutro Abs: 0 10*3/uL — ABNORMAL LOW (ref 1.7–7.7)
Neutrophils Relative %: 1 %
Platelets: 200 10*3/uL (ref 150–400)
RBC: 3.15 MIL/uL — ABNORMAL LOW (ref 3.87–5.11)
RDW: 13.1 % (ref 11.5–15.5)
WBC Morphology: ABNORMAL
WBC: 1.7 10*3/uL — ABNORMAL LOW (ref 4.0–10.5)
nRBC: 0 % (ref 0.0–0.2)

## 2018-02-18 LAB — SAMPLE TO BLOOD BANK

## 2018-02-18 MED ORDER — PROCHLORPERAZINE MALEATE 10 MG PO TABS
10.0000 mg | ORAL_TABLET | Freq: Once | ORAL | Status: AC
  Administered 2018-02-18: 10 mg via ORAL
  Filled 2018-02-18: qty 1

## 2018-02-18 MED ORDER — HEPARIN SOD (PORK) LOCK FLUSH 100 UNIT/ML IV SOLN
500.0000 [IU] | Freq: Once | INTRAVENOUS | Status: AC
  Administered 2018-02-18: 250 [IU] via INTRAVENOUS
  Filled 2018-02-18: qty 5

## 2018-02-18 MED ORDER — SODIUM CHLORIDE 0.9 % IV SOLN
20.0000 mg/m2 | Freq: Once | INTRAVENOUS | Status: AC
  Administered 2018-02-18: 40 mg via INTRAVENOUS
  Filled 2018-02-18: qty 8

## 2018-02-18 MED ORDER — SODIUM CHLORIDE 0.9 % IV SOLN
Freq: Once | INTRAVENOUS | Status: AC
  Administered 2018-02-18: 10:00:00 via INTRAVENOUS
  Filled 2018-02-18: qty 250

## 2018-02-18 MED ORDER — CHLORHEXIDINE GLUCONATE 0.12 % MT SOLN: 10.0000 mL | Freq: Two times a day (BID) | OROMUCOSAL | 3 refills | Status: DC

## 2018-02-18 MED ORDER — SODIUM CHLORIDE 0.9% FLUSH
10.0000 mL | Freq: Once | INTRAVENOUS | Status: DC
  Filled 2018-02-18: qty 10
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
## 2018-02-18 NOTE — Progress Notes (Signed)
Patient here for initial treatment of decitabine. PICC line in place to right upper arm.

## 2018-02-19 ENCOUNTER — Inpatient Hospital Stay: Payer: 59

## 2018-02-19 ENCOUNTER — Other Ambulatory Visit: Payer: 59

## 2018-02-19 VITALS — BP 126/83 | HR 82 | Temp 97.4°F | Resp 18

## 2018-02-19 DIAGNOSIS — C9500 Acute leukemia of unspecified cell type not having achieved remission: Secondary | ICD-10-CM | POA: Diagnosis not present

## 2018-02-19 DIAGNOSIS — C9200 Acute myeloblastic leukemia, not having achieved remission: Secondary | ICD-10-CM

## 2018-02-19 DIAGNOSIS — Z5111 Encounter for antineoplastic chemotherapy: Secondary | ICD-10-CM | POA: Diagnosis not present

## 2018-02-19 MED ORDER — SODIUM CHLORIDE 0.9 % IV SOLN
20.0000 mg/m2 | Freq: Once | INTRAVENOUS | Status: AC
  Administered 2018-02-19: 40 mg via INTRAVENOUS
  Filled 2018-02-19: qty 8

## 2018-02-19 MED ORDER — SODIUM CHLORIDE 0.9 % IV SOLN
Freq: Once | INTRAVENOUS | Status: AC
  Administered 2018-02-19: 09:00:00 via INTRAVENOUS
  Filled 2018-02-19: qty 250

## 2018-02-19 MED ORDER — HEPARIN SOD (PORK) LOCK FLUSH 100 UNIT/ML IV SOLN
500.0000 [IU] | Freq: Once | INTRAVENOUS | Status: AC | PRN
  Administered 2018-02-19: 500 [IU]
  Filled 2018-02-19: qty 5

## 2018-02-19 MED ORDER — PROCHLORPERAZINE MALEATE 10 MG PO TABS
10.0000 mg | ORAL_TABLET | Freq: Once | ORAL | Status: AC
  Administered 2018-02-19: 10 mg via ORAL
  Filled 2018-02-19: qty 1

## 2018-02-20 ENCOUNTER — Inpatient Hospital Stay: Payer: 59

## 2018-02-20 VITALS — BP 139/64 | HR 77 | Temp 97.5°F | Resp 18

## 2018-02-20 DIAGNOSIS — C9200 Acute myeloblastic leukemia, not having achieved remission: Secondary | ICD-10-CM

## 2018-02-20 DIAGNOSIS — C9500 Acute leukemia of unspecified cell type not having achieved remission: Secondary | ICD-10-CM | POA: Diagnosis not present

## 2018-02-20 DIAGNOSIS — Z5111 Encounter for antineoplastic chemotherapy: Secondary | ICD-10-CM | POA: Diagnosis not present

## 2018-02-20 MED ORDER — SODIUM CHLORIDE 0.9 % IV SOLN
Freq: Once | INTRAVENOUS | Status: AC
  Administered 2018-02-20: 09:00:00 via INTRAVENOUS
  Filled 2018-02-20: qty 250

## 2018-02-20 MED ORDER — PROCHLORPERAZINE MALEATE 10 MG PO TABS
10.0000 mg | ORAL_TABLET | Freq: Once | ORAL | Status: AC
  Administered 2018-02-20: 10 mg via ORAL
  Filled 2018-02-20: qty 1

## 2018-02-20 MED ORDER — HEPARIN SOD (PORK) LOCK FLUSH 100 UNIT/ML IV SOLN
250.0000 [IU] | Freq: Once | INTRAVENOUS | Status: AC | PRN
  Administered 2018-02-20: 250 [IU]
  Filled 2018-02-20: qty 5

## 2018-02-20 MED ORDER — SODIUM CHLORIDE 0.9 % IV SOLN
20.0000 mg/m2 | Freq: Once | INTRAVENOUS | Status: AC
  Administered 2018-02-20: 40 mg via INTRAVENOUS
  Filled 2018-02-20: qty 8

## 2018-02-21 ENCOUNTER — Inpatient Hospital Stay: Payer: 59

## 2018-02-21 VITALS — BP 132/84 | HR 72 | Resp 18

## 2018-02-21 DIAGNOSIS — C9500 Acute leukemia of unspecified cell type not having achieved remission: Secondary | ICD-10-CM | POA: Diagnosis not present

## 2018-02-21 DIAGNOSIS — Z5111 Encounter for antineoplastic chemotherapy: Secondary | ICD-10-CM | POA: Diagnosis not present

## 2018-02-21 DIAGNOSIS — C9200 Acute myeloblastic leukemia, not having achieved remission: Secondary | ICD-10-CM

## 2018-02-21 LAB — CBC WITH DIFFERENTIAL/PLATELET
Abs Immature Granulocytes: 0 10*3/uL (ref 0.00–0.07)
BLASTS: 8 %
Basophils Absolute: 0 10*3/uL (ref 0.0–0.1)
Basophils Relative: 0 %
Eosinophils Absolute: 0 10*3/uL (ref 0.0–0.5)
Eosinophils Relative: 0 %
HCT: 26.5 % — ABNORMAL LOW (ref 36.0–46.0)
HEMOGLOBIN: 8.7 g/dL — AB (ref 12.0–15.0)
LYMPHS PCT: 82 %
Lymphs Abs: 1.2 10*3/uL (ref 0.7–4.0)
MCH: 27.7 pg (ref 26.0–34.0)
MCHC: 32.8 g/dL (ref 30.0–36.0)
MCV: 84.4 fL (ref 80.0–100.0)
Monocytes Absolute: 0.1 10*3/uL (ref 0.1–1.0)
Monocytes Relative: 5 %
Neutro Abs: 0.1 10*3/uL — ABNORMAL LOW (ref 1.7–7.7)
Neutrophils Relative %: 5 %
Platelets: 211 10*3/uL (ref 150–400)
RBC: 3.14 MIL/uL — ABNORMAL LOW (ref 3.87–5.11)
RDW: 12.8 % (ref 11.5–15.5)
Smear Review: ADEQUATE
WBC Morphology: ABNORMAL
WBC: 1.5 10*3/uL — ABNORMAL LOW (ref 4.0–10.5)
nRBC: 0 % (ref 0.0–0.2)

## 2018-02-21 LAB — COMPREHENSIVE METABOLIC PANEL
ALT: 59 U/L — ABNORMAL HIGH (ref 0–44)
AST: 47 U/L — ABNORMAL HIGH (ref 15–41)
Albumin: 3.8 g/dL (ref 3.5–5.0)
Alkaline Phosphatase: 123 U/L (ref 38–126)
Anion gap: 8 (ref 5–15)
BILIRUBIN TOTAL: 0.7 mg/dL (ref 0.3–1.2)
BUN: 13 mg/dL (ref 6–20)
CHLORIDE: 106 mmol/L (ref 98–111)
CO2: 25 mmol/L (ref 22–32)
Calcium: 8.6 mg/dL — ABNORMAL LOW (ref 8.9–10.3)
Creatinine, Ser: 1.21 mg/dL — ABNORMAL HIGH (ref 0.44–1.00)
GFR calc Af Amer: >60 mL/min (ref >60)
GFR, EST NON AFRICAN AMERICAN: 53 mL/min — AB (ref >60)
Glucose, Bld: 150 mg/dL — ABNORMAL HIGH (ref 70–99)
Potassium: 3.4 mmol/L — ABNORMAL LOW (ref 3.5–5.1)
Sodium: 139 mmol/L (ref 135–145)
Total Protein: 7.4 g/dL (ref 6.5–8.1)

## 2018-02-21 MED ORDER — HEPARIN SOD (PORK) LOCK FLUSH 100 UNIT/ML IV SOLN
250.0000 [IU] | Freq: Once | INTRAVENOUS | Status: AC | PRN
  Administered 2018-02-21: 250 [IU]
  Filled 2018-02-21: qty 5

## 2018-02-21 MED ORDER — PROCHLORPERAZINE MALEATE 10 MG PO TABS
10.0000 mg | ORAL_TABLET | Freq: Once | ORAL | Status: AC
  Administered 2018-02-21: 10 mg via ORAL
  Filled 2018-02-21: qty 1

## 2018-02-21 MED ORDER — SODIUM CHLORIDE 0.9 % IV SOLN
20.0000 mg/m2 | Freq: Once | INTRAVENOUS | Status: AC
  Administered 2018-02-21: 40 mg via INTRAVENOUS
  Filled 2018-02-21: qty 8

## 2018-02-21 MED ORDER — SODIUM CHLORIDE 0.9 % IV SOLN
Freq: Once | INTRAVENOUS | Status: AC
  Administered 2018-02-21: 10:00:00 via INTRAVENOUS
  Filled 2018-02-21: qty 250

## 2018-02-22 ENCOUNTER — Inpatient Hospital Stay: Payer: 59

## 2018-02-22 VITALS — BP 134/81 | HR 80 | Temp 96.6°F | Resp 18

## 2018-02-22 DIAGNOSIS — C9200 Acute myeloblastic leukemia, not having achieved remission: Secondary | ICD-10-CM

## 2018-02-22 DIAGNOSIS — C9500 Acute leukemia of unspecified cell type not having achieved remission: Secondary | ICD-10-CM | POA: Diagnosis not present

## 2018-02-22 DIAGNOSIS — Z5111 Encounter for antineoplastic chemotherapy: Secondary | ICD-10-CM | POA: Diagnosis not present

## 2018-02-22 MED ORDER — PROCHLORPERAZINE MALEATE 10 MG PO TABS
10.0000 mg | ORAL_TABLET | Freq: Once | ORAL | Status: AC
  Administered 2018-02-22: 10 mg via ORAL
  Filled 2018-02-22: qty 1

## 2018-02-22 MED ORDER — SODIUM CHLORIDE 0.9 % IV SOLN
Freq: Once | INTRAVENOUS | Status: AC
  Administered 2018-02-22: 09:00:00 via INTRAVENOUS
  Filled 2018-02-22: qty 250

## 2018-02-22 MED ORDER — SODIUM CHLORIDE 0.9 % IV SOLN
20.0000 mg/m2 | Freq: Once | INTRAVENOUS | Status: AC
  Administered 2018-02-22: 40 mg via INTRAVENOUS
  Filled 2018-02-22: qty 8

## 2018-02-22 MED ORDER — SODIUM CHLORIDE 0.9% FLUSH
10.0000 mL | INTRAVENOUS | Status: DC | PRN
  Filled 2018-02-22: qty 10

## 2018-02-22 MED ORDER — HEPARIN SOD (PORK) LOCK FLUSH 100 UNIT/ML IV SOLN
500.0000 [IU] | Freq: Once | INTRAVENOUS | Status: AC | PRN
  Administered 2018-02-22: 500 [IU]
  Filled 2018-02-22: qty 5

## 2018-02-23 DIAGNOSIS — C9500 Acute leukemia of unspecified cell type not having achieved remission: Secondary | ICD-10-CM | POA: Diagnosis not present

## 2018-02-24 DIAGNOSIS — C9500 Acute leukemia of unspecified cell type not having achieved remission: Secondary | ICD-10-CM | POA: Diagnosis not present

## 2018-02-25 ENCOUNTER — Inpatient Hospital Stay (HOSPITAL_BASED_OUTPATIENT_CLINIC_OR_DEPARTMENT_OTHER): Payer: 59 | Admitting: Oncology

## 2018-02-25 ENCOUNTER — Encounter: Payer: Self-pay | Admitting: Oncology

## 2018-02-25 ENCOUNTER — Other Ambulatory Visit: Payer: Self-pay

## 2018-02-25 ENCOUNTER — Inpatient Hospital Stay: Payer: 59

## 2018-02-25 VITALS — BP 152/92 | HR 73 | Temp 97.3°F | Resp 18 | Wt 210.5 lb

## 2018-02-25 DIAGNOSIS — Z5111 Encounter for antineoplastic chemotherapy: Secondary | ICD-10-CM | POA: Diagnosis not present

## 2018-02-25 DIAGNOSIS — I427 Cardiomyopathy due to drug and external agent: Secondary | ICD-10-CM | POA: Diagnosis not present

## 2018-02-25 DIAGNOSIS — C9200 Acute myeloblastic leukemia, not having achieved remission: Secondary | ICD-10-CM

## 2018-02-25 DIAGNOSIS — D6481 Anemia due to antineoplastic chemotherapy: Secondary | ICD-10-CM | POA: Diagnosis not present

## 2018-02-25 DIAGNOSIS — D709 Neutropenia, unspecified: Secondary | ICD-10-CM | POA: Diagnosis not present

## 2018-02-25 DIAGNOSIS — T451X5A Adverse effect of antineoplastic and immunosuppressive drugs, initial encounter: Secondary | ICD-10-CM

## 2018-02-25 DIAGNOSIS — T50995A Adverse effect of other drugs, medicaments and biological substances, initial encounter: Secondary | ICD-10-CM

## 2018-02-25 LAB — CBC WITH DIFFERENTIAL/PLATELET
Abs Immature Granulocytes: 0 10*3/uL (ref 0.00–0.07)
Basophils Absolute: 0 10*3/uL (ref 0.0–0.1)
Basophils Relative: 1 %
Eosinophils Absolute: 0 10*3/uL (ref 0.0–0.5)
Eosinophils Relative: 0 %
HCT: 24.6 % — ABNORMAL LOW (ref 36.0–46.0)
Hemoglobin: 8.4 g/dL — ABNORMAL LOW (ref 12.0–15.0)
Immature Granulocytes: 0 %
Lymphocytes Relative: 44 %
Lymphs Abs: 0.8 10*3/uL (ref 0.7–4.0)
MCH: 28.5 pg (ref 26.0–34.0)
MCHC: 34.1 g/dL (ref 30.0–36.0)
MCV: 83.4 fL (ref 80.0–100.0)
Monocytes Absolute: 0.9 10*3/uL (ref 0.1–1.0)
Monocytes Relative: 48 %
Neutro Abs: 0.1 10*3/uL — ABNORMAL LOW (ref 1.7–7.7)
Neutrophils Relative %: 7 %
Platelets: 154 10*3/uL (ref 150–400)
RBC: 2.95 MIL/uL — AB (ref 3.87–5.11)
RDW: 12.6 % (ref 11.5–15.5)
WBC Morphology: ABNORMAL
WBC: 1.8 10*3/uL — AB (ref 4.0–10.5)
nRBC: 0 % (ref 0.0–0.2)

## 2018-02-25 LAB — COMPREHENSIVE METABOLIC PANEL
ALT: 61 U/L — ABNORMAL HIGH (ref 0–44)
AST: 38 U/L (ref 15–41)
Albumin: 4.2 g/dL (ref 3.5–5.0)
Alkaline Phosphatase: 110 U/L (ref 38–126)
Anion gap: 7 (ref 5–15)
BUN: 15 mg/dL (ref 6–20)
CO2: 27 mmol/L (ref 22–32)
Calcium: 9.1 mg/dL (ref 8.9–10.3)
Chloride: 105 mmol/L (ref 98–111)
Creatinine, Ser: 1.34 mg/dL — ABNORMAL HIGH (ref 0.44–1.00)
GFR calc Af Amer: 55 mL/min — ABNORMAL LOW (ref >60)
GFR calc non Af Amer: 47 mL/min — ABNORMAL LOW (ref >60)
Glucose, Bld: 111 mg/dL — ABNORMAL HIGH (ref 70–99)
Potassium: 3.5 mmol/L (ref 3.5–5.1)
Sodium: 139 mmol/L (ref 135–145)
TOTAL PROTEIN: 7.7 g/dL (ref 6.5–8.1)
Total Bilirubin: 0.9 mg/dL (ref 0.3–1.2)

## 2018-02-25 LAB — SAMPLE TO BLOOD BANK

## 2018-02-25 MED ORDER — HEPARIN SOD (PORK) LOCK FLUSH 100 UNIT/ML IV SOLN
500.0000 [IU] | Freq: Once | INTRAVENOUS | Status: AC
  Administered 2018-02-25: 500 [IU] via INTRAVENOUS
  Filled 2018-02-25: qty 5

## 2018-02-25 MED ORDER — SODIUM CHLORIDE 0.9 % IV SOLN
20.0000 mg/m2 | Freq: Once | INTRAVENOUS | Status: AC
  Administered 2018-02-25: 40 mg via INTRAVENOUS
  Filled 2018-02-25: qty 8

## 2018-02-25 MED ORDER — SODIUM CHLORIDE 0.9 % IV SOLN
Freq: Once | INTRAVENOUS | Status: AC
  Administered 2018-02-25: 10:00:00 via INTRAVENOUS
  Filled 2018-02-25: qty 250

## 2018-02-25 MED ORDER — SODIUM CHLORIDE 0.9% FLUSH
10.0000 mL | Freq: Once | INTRAVENOUS | Status: AC
  Administered 2018-02-25: 10 mL via INTRAVENOUS
  Filled 2018-02-25: qty 10

## 2018-02-25 MED ORDER — PROCHLORPERAZINE MALEATE 10 MG PO TABS
10.0000 mg | ORAL_TABLET | Freq: Once | ORAL | Status: AC
  Administered 2018-02-25: 10 mg via ORAL
  Filled 2018-02-25: qty 1

## 2018-02-25 NOTE — Progress Notes (Signed)
Patient here follow up. No concerns voiced.
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
## 2018-02-26 ENCOUNTER — Inpatient Hospital Stay: Payer: 59

## 2018-02-26 VITALS — BP 121/79 | HR 82 | Temp 97.8°F | Resp 18

## 2018-02-26 DIAGNOSIS — C9200 Acute myeloblastic leukemia, not having achieved remission: Secondary | ICD-10-CM

## 2018-02-26 DIAGNOSIS — Z5111 Encounter for antineoplastic chemotherapy: Secondary | ICD-10-CM | POA: Diagnosis not present

## 2018-02-26 MED ORDER — SODIUM CHLORIDE 0.9 % IV SOLN
Freq: Once | INTRAVENOUS | Status: AC
  Administered 2018-02-26: 09:00:00 via INTRAVENOUS
  Filled 2018-02-26: qty 250

## 2018-02-26 MED ORDER — SODIUM CHLORIDE 0.9 % IV SOLN
20.0000 mg/m2 | Freq: Once | INTRAVENOUS | Status: AC
  Administered 2018-02-26: 40 mg via INTRAVENOUS
  Filled 2018-02-26: qty 8

## 2018-02-26 MED ORDER — PROCHLORPERAZINE MALEATE 10 MG PO TABS: 10.0000 mg | ORAL_TABLET | Freq: Once | ORAL | Status: DC

## 2018-02-26 MED ORDER — HEPARIN SOD (PORK) LOCK FLUSH 100 UNIT/ML IV SOLN
250.0000 [IU] | Freq: Once | INTRAVENOUS | Status: AC | PRN
  Administered 2018-02-26: 250 [IU]
  Filled 2018-02-26: qty 5

## 2018-02-26 MED ORDER — SODIUM CHLORIDE 0.9% FLUSH
10.0000 mL | INTRAVENOUS | Status: DC | PRN
  Administered 2018-02-26 (×2): 10 mL
  Filled 2018-02-26: qty 10

## 2018-02-27 ENCOUNTER — Inpatient Hospital Stay: Payer: 59

## 2018-02-27 ENCOUNTER — Other Ambulatory Visit: Payer: Self-pay | Admitting: Oncology

## 2018-02-27 VITALS — BP 145/80 | HR 81 | Temp 96.9°F | Resp 18

## 2018-02-27 DIAGNOSIS — C9200 Acute myeloblastic leukemia, not having achieved remission: Secondary | ICD-10-CM

## 2018-02-27 DIAGNOSIS — Z5111 Encounter for antineoplastic chemotherapy: Secondary | ICD-10-CM | POA: Diagnosis not present

## 2018-02-27 MED ORDER — HEPARIN SOD (PORK) LOCK FLUSH 100 UNIT/ML IV SOLN
500.0000 [IU] | Freq: Once | INTRAVENOUS | Status: AC | PRN
  Administered 2018-02-27: 250 [IU]

## 2018-02-27 MED ORDER — SODIUM CHLORIDE 0.9 % IV SOLN
20.0000 mg/m2 | Freq: Once | INTRAVENOUS | Status: AC
  Administered 2018-02-27: 40 mg via INTRAVENOUS
  Filled 2018-02-27: qty 8

## 2018-02-27 MED ORDER — PROCHLORPERAZINE MALEATE 10 MG PO TABS
10.0000 mg | ORAL_TABLET | Freq: Once | ORAL | Status: AC
  Administered 2018-02-27: 10 mg via ORAL
  Filled 2018-02-27: qty 1

## 2018-02-27 MED ORDER — AMOXICILLIN-POT CLAVULANATE 875-125 MG PO TABS: 1.0000 | ORAL_TABLET | Freq: Two times a day (BID) | ORAL | 0 refills | Status: DC

## 2018-02-27 MED ORDER — SODIUM CHLORIDE 0.9 % IV SOLN
Freq: Once | INTRAVENOUS | Status: AC
  Administered 2018-02-27: 10:00:00 via INTRAVENOUS
  Filled 2018-02-27: qty 250

## 2018-02-27 NOTE — Progress Notes (Signed)
Per pt she is having L ear ache/crackling x 2 days, Dr Tasia Catchings notified. Per provider ok to proceed with tx and will come to infusion suite to assess pt prior to tx completion.

## 2018-02-27 NOTE — Progress Notes (Signed)
Patient c/o left ear discomfort, pressure, and a "crinkle" sound for the past few days.  She states that this discomfort actually was keeping her awake last night.  Dr. Collie Siad team notified.

## 2018-02-28 ENCOUNTER — Inpatient Hospital Stay: Payer: 59

## 2018-02-28 ENCOUNTER — Other Ambulatory Visit: Payer: Self-pay | Admitting: Oncology

## 2018-02-28 VITALS — BP 154/88 | HR 89 | Temp 98.0°F | Resp 18

## 2018-02-28 DIAGNOSIS — C9200 Acute myeloblastic leukemia, not having achieved remission: Secondary | ICD-10-CM

## 2018-02-28 DIAGNOSIS — D649 Anemia, unspecified: Secondary | ICD-10-CM

## 2018-02-28 DIAGNOSIS — Z5111 Encounter for antineoplastic chemotherapy: Secondary | ICD-10-CM | POA: Diagnosis not present

## 2018-02-28 LAB — CBC WITH DIFFERENTIAL/PLATELET
Abs Immature Granulocytes: 0.01 10*3/uL (ref 0.00–0.07)
BASOS ABS: 0 10*3/uL (ref 0.0–0.1)
Basophils Relative: 0 %
Eosinophils Absolute: 0 10*3/uL (ref 0.0–0.5)
Eosinophils Relative: 0 %
HCT: 21.8 % — ABNORMAL LOW (ref 36.0–46.0)
Hemoglobin: 7.3 g/dL — ABNORMAL LOW (ref 12.0–15.0)
Immature Granulocytes: 1 %
Lymphocytes Relative: 45 %
Lymphs Abs: 0.5 10*3/uL — ABNORMAL LOW (ref 0.7–4.0)
MCH: 28.2 pg (ref 26.0–34.0)
MCHC: 33.5 g/dL (ref 30.0–36.0)
MCV: 84.2 fL (ref 80.0–100.0)
MONO ABS: 0.4 10*3/uL (ref 0.1–1.0)
Monocytes Relative: 44 %
Neutro Abs: 0.1 10*3/uL — ABNORMAL LOW (ref 1.7–7.7)
Neutrophils Relative %: 10 %
Platelets: 84 10*3/uL — ABNORMAL LOW (ref 150–400)
RBC: 2.59 MIL/uL — AB (ref 3.87–5.11)
RDW: 12.5 % (ref 11.5–15.5)
WBC Morphology: ABNORMAL
WBC: 1 10*3/uL — CL (ref 4.0–10.5)
nRBC: 0 % (ref 0.0–0.2)

## 2018-02-28 LAB — PREPARE RBC (CROSSMATCH)

## 2018-02-28 MED ORDER — HEPARIN SOD (PORK) LOCK FLUSH 100 UNIT/ML IV SOLN
250.0000 [IU] | Freq: Once | INTRAVENOUS | Status: AC | PRN
  Administered 2018-02-28: 250 [IU]
  Filled 2018-02-28: qty 5

## 2018-02-28 MED ORDER — DIPHENHYDRAMINE HCL 25 MG PO CAPS
25.0000 mg | ORAL_CAPSULE | Freq: Once | ORAL | Status: AC
  Administered 2018-02-28: 25 mg via ORAL
  Filled 2018-02-28: qty 1

## 2018-02-28 MED ORDER — SODIUM CHLORIDE 0.9 % IV SOLN
20.0000 mg/m2 | Freq: Once | INTRAVENOUS | Status: AC
  Administered 2018-02-28: 40 mg via INTRAVENOUS
  Filled 2018-02-28: qty 8

## 2018-02-28 MED ORDER — SODIUM CHLORIDE 0.9% IV SOLUTION
250.0000 mL | Freq: Once | INTRAVENOUS | Status: AC
  Administered 2018-02-28: 250 mL via INTRAVENOUS
  Filled 2018-02-28: qty 250

## 2018-02-28 MED ORDER — SODIUM CHLORIDE 0.9 % IV SOLN
Freq: Once | INTRAVENOUS | Status: AC
  Administered 2018-02-28: 10:00:00 via INTRAVENOUS
  Filled 2018-02-28: qty 250

## 2018-02-28 MED ORDER — ACETAMINOPHEN 325 MG PO TABS
650.0000 mg | ORAL_TABLET | Freq: Once | ORAL | Status: AC
  Administered 2018-02-28: 650 mg via ORAL
  Filled 2018-02-28: qty 2

## 2018-02-28 MED ORDER — SODIUM CHLORIDE 0.9% FLUSH
10.0000 mL | INTRAVENOUS | Status: DC | PRN
  Administered 2018-02-28 (×3): 10 mL
  Filled 2018-02-28: qty 10

## 2018-02-28 MED ORDER — PROCHLORPERAZINE MALEATE 10 MG PO TABS
10.0000 mg | ORAL_TABLET | Freq: Once | ORAL | Status: AC
  Administered 2018-02-28: 10 mg via ORAL
  Filled 2018-02-28: qty 1

## 2018-02-28 NOTE — Progress Notes (Signed)
Lab results reviewed with MD, Dr. Tasia Catchings. Per MD order: proceed with scheduled treatment today. MD to place orders for one unit pRBCs transfusion.

## 2018-03-01 ENCOUNTER — Inpatient Hospital Stay: Payer: 59

## 2018-03-01 VITALS — BP 148/91 | HR 82 | Temp 97.8°F | Resp 18

## 2018-03-01 DIAGNOSIS — Z5111 Encounter for antineoplastic chemotherapy: Secondary | ICD-10-CM | POA: Diagnosis not present

## 2018-03-01 DIAGNOSIS — C9200 Acute myeloblastic leukemia, not having achieved remission: Secondary | ICD-10-CM

## 2018-03-01 LAB — BPAM RBC
Blood Product Expiration Date: 202002262359
ISSUE DATE / TIME: 202002131306
UNIT TYPE AND RH: 5100

## 2018-03-01 LAB — TYPE AND SCREEN
ABO/RH(D): O POS
Antibody Screen: NEGATIVE
Unit division: 0

## 2018-03-01 MED ORDER — HEPARIN SOD (PORK) LOCK FLUSH 100 UNIT/ML IV SOLN
INTRAVENOUS | Status: AC
  Filled 2018-03-01: qty 5

## 2018-03-01 MED ORDER — SODIUM CHLORIDE 0.9 % IV SOLN
Freq: Once | INTRAVENOUS | Status: AC
  Administered 2018-03-01: 10:00:00 via INTRAVENOUS
  Filled 2018-03-01: qty 250

## 2018-03-01 MED ORDER — SODIUM CHLORIDE 0.9 % IV SOLN
20.0000 mg/m2 | Freq: Once | INTRAVENOUS | Status: AC
  Administered 2018-03-01: 40 mg via INTRAVENOUS
  Filled 2018-03-01: qty 8

## 2018-03-01 MED ORDER — SODIUM CHLORIDE 0.9% FLUSH
10.0000 mL | INTRAVENOUS | Status: DC | PRN
  Administered 2018-03-01: 10 mL via INTRAVENOUS
  Filled 2018-03-01: qty 10

## 2018-03-01 MED ORDER — PROCHLORPERAZINE MALEATE 10 MG PO TABS
10.0000 mg | ORAL_TABLET | Freq: Once | ORAL | Status: AC
  Administered 2018-03-01: 10 mg via ORAL
  Filled 2018-03-01: qty 1

## 2018-03-01 MED ORDER — HEPARIN SOD (PORK) LOCK FLUSH 100 UNIT/ML IV SOLN
500.0000 [IU] | Freq: Once | INTRAVENOUS | Status: AC
  Administered 2018-03-01: 500 [IU] via INTRAVENOUS

## 2018-03-04 ENCOUNTER — Other Ambulatory Visit: Payer: Self-pay | Admitting: *Deleted

## 2018-03-04 ENCOUNTER — Inpatient Hospital Stay: Payer: 59

## 2018-03-04 ENCOUNTER — Other Ambulatory Visit: Payer: Self-pay | Admitting: Oncology

## 2018-03-04 DIAGNOSIS — C9200 Acute myeloblastic leukemia, not having achieved remission: Secondary | ICD-10-CM

## 2018-03-04 DIAGNOSIS — Z5111 Encounter for antineoplastic chemotherapy: Secondary | ICD-10-CM | POA: Diagnosis not present

## 2018-03-04 LAB — COMPREHENSIVE METABOLIC PANEL
ALT: 42 U/L (ref 0–44)
ANION GAP: 9 (ref 5–15)
AST: 31 U/L (ref 15–41)
Albumin: 3.9 g/dL (ref 3.5–5.0)
Alkaline Phosphatase: 99 U/L (ref 38–126)
BUN: 14 mg/dL (ref 6–20)
CO2: 25 mmol/L (ref 22–32)
Calcium: 9.2 mg/dL (ref 8.9–10.3)
Chloride: 103 mmol/L (ref 98–111)
Creatinine, Ser: 1.18 mg/dL — ABNORMAL HIGH (ref 0.44–1.00)
GFR calc Af Amer: >60 mL/min (ref >60)
GFR calc non Af Amer: 55 mL/min — ABNORMAL LOW (ref >60)
Glucose, Bld: 133 mg/dL — ABNORMAL HIGH (ref 70–99)
Potassium: 3.5 mmol/L (ref 3.5–5.1)
Sodium: 137 mmol/L (ref 135–145)
Total Bilirubin: 0.8 mg/dL (ref 0.3–1.2)
Total Protein: 7.7 g/dL (ref 6.5–8.1)

## 2018-03-04 LAB — CBC WITH DIFFERENTIAL/PLATELET
Band Neutrophils: 0 %
Basophils Absolute: 0 10*3/uL (ref 0.0–0.1)
Basophils Relative: 0 %
Blasts: 0 %
EOS ABS: 0 10*3/uL (ref 0.0–0.5)
Eosinophils Relative: 0 %
HCT: 22.6 % — ABNORMAL LOW (ref 36.0–46.0)
Hemoglobin: 7.9 g/dL — ABNORMAL LOW (ref 12.0–15.0)
Lymphocytes Relative: 57 %
Lymphs Abs: 0.6 10*3/uL — ABNORMAL LOW (ref 0.7–4.0)
MCH: 28.5 pg (ref 26.0–34.0)
MCHC: 35 g/dL (ref 30.0–36.0)
MCV: 81.6 fL (ref 80.0–100.0)
Metamyelocytes Relative: 0 %
Monocytes Absolute: 0 10*3/uL — ABNORMAL LOW (ref 0.1–1.0)
Monocytes Relative: 1 %
Myelocytes: 0 %
Neutro Abs: 0.1 10*3/uL — ABNORMAL LOW (ref 1.7–7.7)
Neutrophils Relative %: 7 %
OTHER: 35 %
PLATELETS: 35 10*3/uL — AB (ref 150–400)
PROMYELOCYTES RELATIVE: 0 %
RBC: 2.77 MIL/uL — ABNORMAL LOW (ref 3.87–5.11)
RDW: 11.9 % (ref 11.5–15.5)
Smear Review: DECREASED
WBC Morphology: ABNORMAL
WBC: 1 10*3/uL — CL (ref 4.0–10.5)
nRBC: 0 % (ref 0.0–0.2)
nRBC: 0 /100 WBC

## 2018-03-04 LAB — PREPARE RBC (CROSSMATCH)

## 2018-03-04 LAB — PATHOLOGIST SMEAR REVIEW

## 2018-03-04 MED ORDER — HEPARIN SOD (PORK) LOCK FLUSH 100 UNIT/ML IV SOLN
250.0000 [IU] | INTRAVENOUS | Status: AC | PRN
  Administered 2018-03-04: 250 [IU]
  Filled 2018-03-04: qty 5

## 2018-03-04 MED ORDER — DIPHENHYDRAMINE HCL 25 MG PO CAPS
25.0000 mg | ORAL_CAPSULE | Freq: Once | ORAL | Status: AC
  Administered 2018-03-04: 25 mg via ORAL
  Filled 2018-03-04: qty 1

## 2018-03-04 MED ORDER — SODIUM CHLORIDE 0.9% IV SOLUTION
250.0000 mL | Freq: Once | INTRAVENOUS | Status: AC
  Administered 2018-03-04: 250 mL via INTRAVENOUS
  Filled 2018-03-04: qty 250

## 2018-03-04 MED ORDER — ACETAMINOPHEN 325 MG PO TABS
650.0000 mg | ORAL_TABLET | Freq: Once | ORAL | Status: AC
  Administered 2018-03-04: 650 mg via ORAL
  Filled 2018-03-04: qty 2

## 2018-03-05 LAB — TYPE AND SCREEN
ABO/RH(D): O POS
Antibody Screen: NEGATIVE
Unit division: 0

## 2018-03-05 LAB — BPAM RBC
Blood Product Expiration Date: 202003042359
ISSUE DATE / TIME: 202002171056
Unit Type and Rh: 5100

## 2018-03-07 ENCOUNTER — Inpatient Hospital Stay: Payer: 59

## 2018-03-07 ENCOUNTER — Other Ambulatory Visit: Payer: Self-pay

## 2018-03-07 ENCOUNTER — Inpatient Hospital Stay (HOSPITAL_BASED_OUTPATIENT_CLINIC_OR_DEPARTMENT_OTHER): Payer: 59 | Admitting: Oncology

## 2018-03-07 ENCOUNTER — Other Ambulatory Visit: Payer: Self-pay | Admitting: Oncology

## 2018-03-07 ENCOUNTER — Encounter: Payer: Self-pay | Admitting: Oncology

## 2018-03-07 VITALS — BP 160/98 | HR 76 | Temp 96.3°F | Resp 18 | Wt 212.7 lb

## 2018-03-07 VITALS — BP 151/90 | HR 74 | Temp 96.5°F | Resp 20

## 2018-03-07 DIAGNOSIS — D696 Thrombocytopenia, unspecified: Secondary | ICD-10-CM

## 2018-03-07 DIAGNOSIS — D649 Anemia, unspecified: Secondary | ICD-10-CM

## 2018-03-07 DIAGNOSIS — I427 Cardiomyopathy due to drug and external agent: Secondary | ICD-10-CM

## 2018-03-07 DIAGNOSIS — C9200 Acute myeloblastic leukemia, not having achieved remission: Secondary | ICD-10-CM

## 2018-03-07 DIAGNOSIS — D709 Neutropenia, unspecified: Secondary | ICD-10-CM

## 2018-03-07 DIAGNOSIS — Z5111 Encounter for antineoplastic chemotherapy: Secondary | ICD-10-CM | POA: Diagnosis not present

## 2018-03-07 DIAGNOSIS — T451X5A Adverse effect of antineoplastic and immunosuppressive drugs, initial encounter: Secondary | ICD-10-CM

## 2018-03-07 DIAGNOSIS — D6481 Anemia due to antineoplastic chemotherapy: Secondary | ICD-10-CM

## 2018-03-07 LAB — CBC WITH DIFFERENTIAL/PLATELET
Abs Immature Granulocytes: 0 10*3/uL (ref 0.00–0.07)
Basophils Absolute: 0 10*3/uL (ref 0.0–0.1)
Basophils Relative: 0 %
Blasts: 36 %
Eosinophils Absolute: 0 10*3/uL (ref 0.0–0.5)
Eosinophils Relative: 1 %
HCT: 24.1 % — ABNORMAL LOW (ref 36.0–46.0)
Hemoglobin: 8.5 g/dL — ABNORMAL LOW (ref 12.0–15.0)
Lymphocytes Relative: 62 %
Lymphs Abs: 0.7 10*3/uL (ref 0.7–4.0)
MCH: 28.6 pg (ref 26.0–34.0)
MCHC: 35.3 g/dL (ref 30.0–36.0)
MCV: 81.1 fL (ref 80.0–100.0)
MONO ABS: 0 10*3/uL — AB (ref 0.1–1.0)
Monocytes Relative: 0 %
Neutro Abs: 0 10*3/uL — ABNORMAL LOW (ref 1.7–7.7)
Neutrophils Relative %: 1 %
Platelets: 20 10*3/uL — CL (ref 150–400)
RBC: 2.97 MIL/uL — ABNORMAL LOW (ref 3.87–5.11)
RDW: 12.2 % (ref 11.5–15.5)
WBC Morphology: ABNORMAL
WBC: 1.1 10*3/uL — CL (ref 4.0–10.5)
nRBC: 0 % (ref 0.0–0.2)

## 2018-03-07 MED ORDER — SODIUM CHLORIDE 0.9% FLUSH
10.0000 mL | INTRAVENOUS | Status: DC | PRN
  Administered 2018-03-07: 10 mL via INTRAVENOUS
  Filled 2018-03-07: qty 10

## 2018-03-07 MED ORDER — SODIUM CHLORIDE 0.9% FLUSH
10.0000 mL | INTRAVENOUS | Status: AC | PRN
  Administered 2018-03-07: 10 mL
  Filled 2018-03-07: qty 10

## 2018-03-07 MED ORDER — HEPARIN SOD (PORK) LOCK FLUSH 100 UNIT/ML IV SOLN: 500.0000 [IU] | Freq: Once | INTRAVENOUS | Status: DC

## 2018-03-07 MED ORDER — ACETAMINOPHEN 325 MG PO TABS
650.0000 mg | ORAL_TABLET | Freq: Once | ORAL | Status: AC
  Administered 2018-03-07: 650 mg via ORAL
  Filled 2018-03-07: qty 2

## 2018-03-07 MED ORDER — SODIUM CHLORIDE 0.9% FLUSH
10.0000 mL | Freq: Once | INTRAVENOUS | Status: AC
  Administered 2018-03-07: 10 mL via INTRAVENOUS
  Filled 2018-03-07: qty 10

## 2018-03-07 MED ORDER — SODIUM CHLORIDE 0.9% IV SOLUTION
250.0000 mL | Freq: Once | INTRAVENOUS | Status: AC
  Administered 2018-03-07: 250 mL via INTRAVENOUS
  Filled 2018-03-07: qty 250

## 2018-03-07 MED ORDER — DIPHENHYDRAMINE HCL 25 MG PO CAPS
25.0000 mg | ORAL_CAPSULE | Freq: Once | ORAL | Status: AC
  Administered 2018-03-07: 25 mg via ORAL
  Filled 2018-03-07: qty 1

## 2018-03-07 MED ORDER — HEPARIN SOD (PORK) LOCK FLUSH 100 UNIT/ML IV SOLN
250.0000 [IU] | INTRAVENOUS | Status: AC | PRN
  Administered 2018-03-07: 250 [IU]
  Filled 2018-03-07: qty 5

## 2018-03-07 NOTE — Progress Notes (Signed)
Patient here for follow up. Complains of having night sweats. Continues on abt for Ear.
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
## 2018-03-08 LAB — BPAM PLATELET PHERESIS
Blood Product Expiration Date: 202002202359
ISSUE DATE / TIME: 202002201153
Unit Type and Rh: 5100

## 2018-03-08 LAB — PREPARE PLATELET PHERESIS: Unit division: 0

## 2018-03-11 ENCOUNTER — Other Ambulatory Visit: Payer: Self-pay | Admitting: *Deleted

## 2018-03-11 DIAGNOSIS — C9200 Acute myeloblastic leukemia, not having achieved remission: Secondary | ICD-10-CM | POA: Diagnosis not present

## 2018-03-11 NOTE — Telephone Encounter (Signed)
...     Ref Range & Units 7d ago (03/04/18) 2wk ago (02/25/18) 2wk ago (02/21/18) 3wk ago (02/18/18) 3wk ago (02/14/18) 5mo ago (02/07/18) 1mo ago (02/04/18)  Potassium 3.5 - 5.1 mmol/L 3.5  3.5  3.4Low   3.4Low

## 2018-03-14 DIAGNOSIS — C92A0 Acute myeloid leukemia with multilineage dysplasia, not having achieved remission: Secondary | ICD-10-CM | POA: Diagnosis not present

## 2018-03-14 DIAGNOSIS — I5020 Unspecified systolic (congestive) heart failure: Secondary | ICD-10-CM | POA: Diagnosis not present

## 2018-03-14 DIAGNOSIS — J029 Acute pharyngitis, unspecified: Secondary | ICD-10-CM | POA: Diagnosis not present

## 2018-03-14 DIAGNOSIS — D469 Myelodysplastic syndrome, unspecified: Secondary | ICD-10-CM | POA: Diagnosis not present

## 2018-03-15 ENCOUNTER — Telehealth: Payer: Self-pay

## 2018-03-15 NOTE — Telephone Encounter (Signed)
Received call from Dana Allan, NP at [facility], stating that Rhonda Kane had a Bone marrow biopsy done and it showed stable disease- no remission. Per Lenna Sciara, Dr. Melvyn Neth recommends a 3rd cycle of decitabine (which will be due on Monday). After decitabine they will do another bone marrow biopsy at [facility] in 3-4 weeks. Dr. Tasia Catchings notiifed and is ok to proceed with treatment this coming week. Pt has been scheduled for Decitabine Day 1-10. First appointment on Monday 3/2.

## 2018-03-18 ENCOUNTER — Inpatient Hospital Stay: Payer: 59

## 2018-03-18 ENCOUNTER — Encounter: Payer: Self-pay | Admitting: Oncology

## 2018-03-18 ENCOUNTER — Other Ambulatory Visit: Payer: Self-pay | Admitting: Oncology

## 2018-03-18 ENCOUNTER — Inpatient Hospital Stay (HOSPITAL_BASED_OUTPATIENT_CLINIC_OR_DEPARTMENT_OTHER): Payer: 59 | Admitting: Oncology

## 2018-03-18 ENCOUNTER — Inpatient Hospital Stay: Payer: 59 | Attending: Oncology

## 2018-03-18 VITALS — BP 143/77 | HR 77 | Temp 97.1°F | Resp 16 | Wt 213.0 lb

## 2018-03-18 VITALS — BP 134/77 | HR 80 | Temp 97.0°F | Resp 20

## 2018-03-18 DIAGNOSIS — D61818 Other pancytopenia: Secondary | ICD-10-CM | POA: Diagnosis not present

## 2018-03-18 DIAGNOSIS — T451X5A Adverse effect of antineoplastic and immunosuppressive drugs, initial encounter: Secondary | ICD-10-CM

## 2018-03-18 DIAGNOSIS — Z5111 Encounter for antineoplastic chemotherapy: Secondary | ICD-10-CM | POA: Diagnosis not present

## 2018-03-18 DIAGNOSIS — D649 Anemia, unspecified: Secondary | ICD-10-CM | POA: Diagnosis not present

## 2018-03-18 DIAGNOSIS — R0982 Postnasal drip: Secondary | ICD-10-CM | POA: Diagnosis not present

## 2018-03-18 DIAGNOSIS — D6481 Anemia due to antineoplastic chemotherapy: Secondary | ICD-10-CM | POA: Diagnosis not present

## 2018-03-18 DIAGNOSIS — D709 Neutropenia, unspecified: Secondary | ICD-10-CM

## 2018-03-18 DIAGNOSIS — C9200 Acute myeloblastic leukemia, not having achieved remission: Secondary | ICD-10-CM | POA: Diagnosis not present

## 2018-03-18 DIAGNOSIS — D696 Thrombocytopenia, unspecified: Secondary | ICD-10-CM

## 2018-03-18 DIAGNOSIS — R52 Pain, unspecified: Secondary | ICD-10-CM | POA: Diagnosis not present

## 2018-03-18 DIAGNOSIS — I427 Cardiomyopathy due to drug and external agent: Secondary | ICD-10-CM

## 2018-03-18 LAB — CBC WITH DIFFERENTIAL/PLATELET
Abs Immature Granulocytes: 0 10*3/uL (ref 0.00–0.07)
BASOS ABS: 0 10*3/uL (ref 0.0–0.1)
Basophils Relative: 0 %
Blasts: 38 %
Eosinophils Absolute: 0 10*3/uL (ref 0.0–0.5)
Eosinophils Relative: 0 %
HCT: 21.5 % — ABNORMAL LOW (ref 36.0–46.0)
Hemoglobin: 7.5 g/dL — ABNORMAL LOW (ref 12.0–15.0)
Lymphocytes Relative: 59 %
Lymphs Abs: 0.9 10*3/uL (ref 0.7–4.0)
MCH: 28.6 pg (ref 26.0–34.0)
MCHC: 34.9 g/dL (ref 30.0–36.0)
MCV: 82.1 fL (ref 80.0–100.0)
Monocytes Absolute: 0 10*3/uL — ABNORMAL LOW (ref 0.1–1.0)
Monocytes Relative: 2 %
NRBC: 0 % (ref 0.0–0.2)
Neutro Abs: 0 10*3/uL — ABNORMAL LOW (ref 1.7–7.7)
Neutrophils Relative %: 1 %
Platelets: 145 10*3/uL — ABNORMAL LOW (ref 150–400)
RBC: 2.62 MIL/uL — ABNORMAL LOW (ref 3.87–5.11)
RDW: 12.6 % (ref 11.5–15.5)
WBC: 1.5 10*3/uL — ABNORMAL LOW (ref 4.0–10.5)

## 2018-03-18 LAB — COMPREHENSIVE METABOLIC PANEL
ALK PHOS: 76 U/L (ref 38–126)
ALT: 25 U/L (ref 0–44)
ANION GAP: 9 (ref 5–15)
AST: 22 U/L (ref 15–41)
Albumin: 3.8 g/dL (ref 3.5–5.0)
BUN: 11 mg/dL (ref 6–20)
CO2: 24 mmol/L (ref 22–32)
Calcium: 8.8 mg/dL — ABNORMAL LOW (ref 8.9–10.3)
Chloride: 105 mmol/L (ref 98–111)
Creatinine, Ser: 1.4 mg/dL — ABNORMAL HIGH (ref 0.44–1.00)
GFR calc Af Amer: 52 mL/min — ABNORMAL LOW (ref >60)
GFR calc non Af Amer: 45 mL/min — ABNORMAL LOW (ref >60)
Glucose, Bld: 101 mg/dL — ABNORMAL HIGH (ref 70–99)
Potassium: 3 mmol/L — ABNORMAL LOW (ref 3.5–5.1)
SODIUM: 138 mmol/L (ref 135–145)
Total Bilirubin: 0.8 mg/dL (ref 0.3–1.2)
Total Protein: 7.4 g/dL (ref 6.5–8.1)

## 2018-03-18 LAB — PREPARE RBC (CROSSMATCH)

## 2018-03-18 MED ORDER — SODIUM CHLORIDE 0.9 % IV SOLN
Freq: Once | INTRAVENOUS | Status: AC
  Administered 2018-03-18: 13:00:00 via INTRAVENOUS
  Filled 2018-03-18: qty 250

## 2018-03-18 MED ORDER — SODIUM CHLORIDE 0.9% IV SOLUTION
250.0000 mL | Freq: Once | INTRAVENOUS | Status: AC
  Administered 2018-03-18: 250 mL via INTRAVENOUS
  Filled 2018-03-18: qty 250

## 2018-03-18 MED ORDER — SODIUM CHLORIDE 0.9% FLUSH
10.0000 mL | Freq: Once | INTRAVENOUS | Status: AC
  Administered 2018-03-18: 10 mL via INTRAVENOUS
  Filled 2018-03-18: qty 10

## 2018-03-18 MED ORDER — ACETAMINOPHEN 325 MG PO TABS
650.0000 mg | ORAL_TABLET | Freq: Once | ORAL | Status: AC
  Administered 2018-03-18: 650 mg via ORAL
  Filled 2018-03-18: qty 2

## 2018-03-18 MED ORDER — DIPHENHYDRAMINE HCL 25 MG PO CAPS
25.0000 mg | ORAL_CAPSULE | Freq: Once | ORAL | Status: AC
  Administered 2018-03-18: 25 mg via ORAL
  Filled 2018-03-18: qty 1

## 2018-03-18 MED ORDER — SODIUM CHLORIDE 0.9 % IV SOLN
20.0000 mg/m2 | Freq: Once | INTRAVENOUS | Status: AC
  Administered 2018-03-18: 40 mg via INTRAVENOUS
  Filled 2018-03-18: qty 8

## 2018-03-18 MED ORDER — HEPARIN SOD (PORK) LOCK FLUSH 100 UNIT/ML IV SOLN
500.0000 [IU] | Freq: Once | INTRAVENOUS | Status: AC
  Administered 2018-03-18: 500 [IU] via INTRAVENOUS
  Filled 2018-03-18: qty 5

## 2018-03-18 MED ORDER — PROCHLORPERAZINE MALEATE 10 MG PO TABS
10.0000 mg | ORAL_TABLET | Freq: Once | ORAL | Status: AC
  Administered 2018-03-18: 10 mg via ORAL
  Filled 2018-03-18: qty 1
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
## 2018-03-19 ENCOUNTER — Inpatient Hospital Stay: Payer: 59

## 2018-03-19 VITALS — BP 146/77 | HR 81 | Temp 96.0°F | Resp 18 | Wt 213.0 lb

## 2018-03-19 DIAGNOSIS — T451X5A Adverse effect of antineoplastic and immunosuppressive drugs, initial encounter: Secondary | ICD-10-CM | POA: Diagnosis not present

## 2018-03-19 DIAGNOSIS — I427 Cardiomyopathy due to drug and external agent: Secondary | ICD-10-CM | POA: Diagnosis not present

## 2018-03-19 DIAGNOSIS — R931 Abnormal findings on diagnostic imaging of heart and coronary circulation: Secondary | ICD-10-CM | POA: Diagnosis not present

## 2018-03-19 DIAGNOSIS — I5021 Acute systolic (congestive) heart failure: Secondary | ICD-10-CM | POA: Diagnosis not present

## 2018-03-19 DIAGNOSIS — I5022 Chronic systolic (congestive) heart failure: Secondary | ICD-10-CM | POA: Diagnosis not present

## 2018-03-19 DIAGNOSIS — Z5111 Encounter for antineoplastic chemotherapy: Secondary | ICD-10-CM | POA: Diagnosis not present

## 2018-03-19 DIAGNOSIS — C9200 Acute myeloblastic leukemia, not having achieved remission: Secondary | ICD-10-CM

## 2018-03-19 DIAGNOSIS — C92A0 Acute myeloid leukemia with multilineage dysplasia, not having achieved remission: Secondary | ICD-10-CM | POA: Diagnosis not present

## 2018-03-19 DIAGNOSIS — I429 Cardiomyopathy, unspecified: Secondary | ICD-10-CM | POA: Diagnosis not present

## 2018-03-19 LAB — BPAM RBC
Blood Product Expiration Date: 202003152359
ISSUE DATE / TIME: 202003021110
UNIT TYPE AND RH: 5100

## 2018-03-19 LAB — TYPE AND SCREEN
ABO/RH(D): O POS
ANTIBODY SCREEN: NEGATIVE
UNIT DIVISION: 0

## 2018-03-19 MED ORDER — PROCHLORPERAZINE MALEATE 10 MG PO TABS
10.0000 mg | ORAL_TABLET | Freq: Once | ORAL | Status: AC
  Administered 2018-03-19: 10 mg via ORAL
  Filled 2018-03-19: qty 1

## 2018-03-19 MED ORDER — SODIUM CHLORIDE 0.9 % IV SOLN
20.0000 mg/m2 | Freq: Once | INTRAVENOUS | Status: AC
  Administered 2018-03-19: 40 mg via INTRAVENOUS
  Filled 2018-03-19: qty 8

## 2018-03-19 MED ORDER — SODIUM CHLORIDE 0.9 % IV SOLN
Freq: Once | INTRAVENOUS | Status: AC
  Administered 2018-03-19: 13:00:00 via INTRAVENOUS
  Filled 2018-03-19: qty 250

## 2018-03-19 MED ORDER — HEPARIN SOD (PORK) LOCK FLUSH 100 UNIT/ML IV SOLN
INTRAVENOUS | Status: AC
  Filled 2018-03-19: qty 5

## 2018-03-19 MED ORDER — SODIUM CHLORIDE 0.9% FLUSH
10.0000 mL | INTRAVENOUS | Status: DC | PRN
  Administered 2018-03-19: 10 mL
  Filled 2018-03-19: qty 10

## 2018-03-19 MED ORDER — HEPARIN SOD (PORK) LOCK FLUSH 100 UNIT/ML IV SOLN
500.0000 [IU] | Freq: Once | INTRAVENOUS | Status: AC | PRN
  Administered 2018-03-19: 500 [IU]
  Filled 2018-03-19: qty 5

## 2018-03-20 ENCOUNTER — Inpatient Hospital Stay: Payer: 59

## 2018-03-20 VITALS — BP 143/84 | HR 73 | Temp 97.7°F | Resp 20

## 2018-03-20 DIAGNOSIS — Z5111 Encounter for antineoplastic chemotherapy: Secondary | ICD-10-CM | POA: Diagnosis not present

## 2018-03-20 DIAGNOSIS — C9200 Acute myeloblastic leukemia, not having achieved remission: Secondary | ICD-10-CM

## 2018-03-20 MED ORDER — SODIUM CHLORIDE 0.9 % IV SOLN
Freq: Once | INTRAVENOUS | Status: AC
  Administered 2018-03-20: 10:00:00 via INTRAVENOUS
  Filled 2018-03-20: qty 250

## 2018-03-20 MED ORDER — SODIUM CHLORIDE 0.9% FLUSH
10.0000 mL | INTRAVENOUS | Status: DC | PRN
  Administered 2018-03-20: 10 mL
  Filled 2018-03-20: qty 10

## 2018-03-20 MED ORDER — SODIUM CHLORIDE 0.9 % IV SOLN
20.0000 mg/m2 | Freq: Once | INTRAVENOUS | Status: AC
  Administered 2018-03-20: 40 mg via INTRAVENOUS
  Filled 2018-03-20: qty 8

## 2018-03-20 MED ORDER — PROCHLORPERAZINE MALEATE 10 MG PO TABS
10.0000 mg | ORAL_TABLET | Freq: Once | ORAL | Status: AC
  Administered 2018-03-20: 10 mg via ORAL

## 2018-03-20 MED ORDER — HEPARIN SOD (PORK) LOCK FLUSH 100 UNIT/ML IV SOLN
500.0000 [IU] | Freq: Once | INTRAVENOUS | Status: AC | PRN
  Administered 2018-03-20: 500 [IU]
  Filled 2018-03-20: qty 5

## 2018-03-21 ENCOUNTER — Other Ambulatory Visit: Payer: Self-pay | Admitting: Oncology

## 2018-03-21 ENCOUNTER — Inpatient Hospital Stay: Payer: 59

## 2018-03-21 DIAGNOSIS — E876 Hypokalemia: Secondary | ICD-10-CM

## 2018-03-21 DIAGNOSIS — Z5111 Encounter for antineoplastic chemotherapy: Secondary | ICD-10-CM | POA: Diagnosis not present

## 2018-03-21 DIAGNOSIS — C9200 Acute myeloblastic leukemia, not having achieved remission: Secondary | ICD-10-CM

## 2018-03-21 LAB — CBC WITH DIFFERENTIAL/PLATELET
Abs Immature Granulocytes: 0 10*3/uL (ref 0.00–0.07)
Band Neutrophils: 1 %
Basophils Absolute: 0 10*3/uL (ref 0.0–0.1)
Basophils Relative: 0 %
Blasts: 38 %
EOS ABS: 0 10*3/uL (ref 0.0–0.5)
Eosinophils Relative: 0 %
HCT: 23.5 % — ABNORMAL LOW (ref 36.0–46.0)
Hemoglobin: 8.1 g/dL — ABNORMAL LOW (ref 12.0–15.0)
Lymphocytes Relative: 56 %
Lymphs Abs: 0.7 10*3/uL (ref 0.7–4.0)
MCH: 28.7 pg (ref 26.0–34.0)
MCHC: 34.5 g/dL (ref 30.0–36.0)
MCV: 83.3 fL (ref 80.0–100.0)
MONO ABS: 0 10*3/uL — AB (ref 0.1–1.0)
Monocytes Relative: 2 %
Neutro Abs: 0.1 10*3/uL — ABNORMAL LOW (ref 1.7–7.7)
Neutrophils Relative %: 3 %
Platelets: 178 10*3/uL (ref 150–400)
RBC: 2.82 MIL/uL — ABNORMAL LOW (ref 3.87–5.11)
RDW: 12.7 % (ref 11.5–15.5)
Smear Review: ADEQUATE
WBC Morphology: ABNORMAL
WBC: 1.3 10*3/uL — CL (ref 4.0–10.5)
nRBC: 0 % (ref 0.0–0.2)

## 2018-03-21 LAB — COMPREHENSIVE METABOLIC PANEL
ALK PHOS: 88 U/L (ref 38–126)
ALT: 25 U/L (ref 0–44)
AST: 23 U/L (ref 15–41)
Albumin: 3.8 g/dL (ref 3.5–5.0)
Anion gap: 8 (ref 5–15)
BUN: 12 mg/dL (ref 6–20)
CALCIUM: 8.6 mg/dL — AB (ref 8.9–10.3)
CO2: 24 mmol/L (ref 22–32)
Chloride: 104 mmol/L (ref 98–111)
Creatinine, Ser: 1.18 mg/dL — ABNORMAL HIGH (ref 0.44–1.00)
GFR calc Af Amer: >60 mL/min (ref >60)
GFR calc non Af Amer: 55 mL/min — ABNORMAL LOW (ref >60)
Glucose, Bld: 157 mg/dL — ABNORMAL HIGH (ref 70–99)
Potassium: 2.9 mmol/L — ABNORMAL LOW (ref 3.5–5.1)
Sodium: 136 mmol/L (ref 135–145)
TOTAL PROTEIN: 7.2 g/dL (ref 6.5–8.1)
Total Bilirubin: 0.7 mg/dL (ref 0.3–1.2)

## 2018-03-21 MED ORDER — HEPARIN SOD (PORK) LOCK FLUSH 100 UNIT/ML IV SOLN
500.0000 [IU] | Freq: Once | INTRAVENOUS | Status: AC
  Administered 2018-03-21: 500 [IU] via INTRAVENOUS

## 2018-03-21 MED ORDER — SODIUM CHLORIDE 0.9 % IV SOLN
40.0000 meq | Freq: Once | INTRAVENOUS | Status: AC
  Administered 2018-03-21: 40 meq via INTRAVENOUS
  Filled 2018-03-21: qty 20

## 2018-03-21 MED ORDER — SODIUM CHLORIDE 0.9 % IV SOLN
Freq: Once | INTRAVENOUS | Status: AC
  Administered 2018-03-21: 10:00:00 via INTRAVENOUS
  Filled 2018-03-21: qty 250

## 2018-03-21 MED ORDER — SODIUM CHLORIDE 0.9 % IV SOLN: 40.0000 meq | Freq: Once | INTRAVENOUS | Status: DC

## 2018-03-21 MED ORDER — PROCHLORPERAZINE MALEATE 10 MG PO TABS
10.0000 mg | ORAL_TABLET | Freq: Once | ORAL | Status: AC
  Administered 2018-03-21: 10 mg via ORAL
  Filled 2018-03-21: qty 1

## 2018-03-21 MED ORDER — SODIUM CHLORIDE 0.9% FLUSH
10.0000 mL | Freq: Once | INTRAVENOUS | Status: AC
  Administered 2018-03-21: 10 mL via INTRAVENOUS
  Filled 2018-03-21: qty 10

## 2018-03-21 MED ORDER — SODIUM CHLORIDE 0.9 % IV SOLN
20.0000 mg/m2 | Freq: Once | INTRAVENOUS | Status: AC
  Administered 2018-03-21: 40 mg via INTRAVENOUS
  Filled 2018-03-21: qty 8

## 2018-03-22 ENCOUNTER — Inpatient Hospital Stay: Payer: 59

## 2018-03-22 VITALS — BP 138/87 | HR 81 | Temp 97.2°F | Resp 19

## 2018-03-22 DIAGNOSIS — C9200 Acute myeloblastic leukemia, not having achieved remission: Secondary | ICD-10-CM

## 2018-03-22 DIAGNOSIS — Z5111 Encounter for antineoplastic chemotherapy: Secondary | ICD-10-CM | POA: Diagnosis not present

## 2018-03-22 MED ORDER — HEPARIN SOD (PORK) LOCK FLUSH 100 UNIT/ML IV SOLN
250.0000 [IU] | Freq: Once | INTRAVENOUS | Status: AC | PRN
  Administered 2018-03-22: 250 [IU]

## 2018-03-22 MED ORDER — SODIUM CHLORIDE 0.9% FLUSH
10.0000 mL | INTRAVENOUS | Status: DC | PRN
  Administered 2018-03-22: 10 mL
  Filled 2018-03-22: qty 10

## 2018-03-22 MED ORDER — SODIUM CHLORIDE 0.9 % IV SOLN
Freq: Once | INTRAVENOUS | Status: AC
  Administered 2018-03-22: 09:00:00 via INTRAVENOUS
  Filled 2018-03-22: qty 250

## 2018-03-22 MED ORDER — PROCHLORPERAZINE MALEATE 10 MG PO TABS
10.0000 mg | ORAL_TABLET | Freq: Once | ORAL | Status: AC
  Administered 2018-03-22: 10 mg via ORAL
  Filled 2018-03-22: qty 1

## 2018-03-22 MED ORDER — HEPARIN SOD (PORK) LOCK FLUSH 100 UNIT/ML IV SOLN
500.0000 [IU] | Freq: Once | INTRAVENOUS | Status: DC | PRN
  Filled 2018-03-22: qty 5

## 2018-03-22 MED ORDER — SODIUM CHLORIDE 0.9 % IV SOLN
20.0000 mg/m2 | Freq: Once | INTRAVENOUS | Status: AC
  Administered 2018-03-22: 40 mg via INTRAVENOUS
  Filled 2018-03-22: qty 8

## 2018-03-25 ENCOUNTER — Inpatient Hospital Stay: Payer: 59

## 2018-03-25 ENCOUNTER — Other Ambulatory Visit: Payer: Self-pay | Admitting: Oncology

## 2018-03-25 ENCOUNTER — Other Ambulatory Visit: Payer: Self-pay

## 2018-03-25 DIAGNOSIS — C9200 Acute myeloblastic leukemia, not having achieved remission: Secondary | ICD-10-CM

## 2018-03-25 DIAGNOSIS — Z5111 Encounter for antineoplastic chemotherapy: Secondary | ICD-10-CM | POA: Diagnosis not present

## 2018-03-25 MED ORDER — SODIUM CHLORIDE 0.9 % IV SOLN
20.0000 mg/m2 | Freq: Once | INTRAVENOUS | Status: AC
  Administered 2018-03-25: 40 mg via INTRAVENOUS
  Filled 2018-03-25: qty 8

## 2018-03-25 MED ORDER — PROCHLORPERAZINE MALEATE 10 MG PO TABS
10.0000 mg | ORAL_TABLET | Freq: Once | ORAL | Status: AC
  Administered 2018-03-25: 10 mg via ORAL
  Filled 2018-03-25: qty 1

## 2018-03-25 MED ORDER — SODIUM CHLORIDE 0.9 % IV SOLN
Freq: Once | INTRAVENOUS | Status: AC
  Administered 2018-03-25: 10:00:00 via INTRAVENOUS
  Filled 2018-03-25: qty 250

## 2018-03-25 MED ORDER — HEPARIN SOD (PORK) LOCK FLUSH 100 UNIT/ML IV SOLN
500.0000 [IU] | Freq: Once | INTRAVENOUS | Status: AC | PRN
  Administered 2018-03-25: 500 [IU]
  Filled 2018-03-25: qty 5

## 2018-03-26 ENCOUNTER — Encounter: Payer: Self-pay | Admitting: Oncology

## 2018-03-26 ENCOUNTER — Inpatient Hospital Stay: Payer: 59

## 2018-03-26 ENCOUNTER — Other Ambulatory Visit: Payer: Self-pay | Admitting: Oncology

## 2018-03-26 ENCOUNTER — Inpatient Hospital Stay (HOSPITAL_BASED_OUTPATIENT_CLINIC_OR_DEPARTMENT_OTHER): Payer: 59 | Admitting: Oncology

## 2018-03-26 VITALS — BP 151/89 | HR 69 | Temp 96.3°F | Resp 18 | Wt 218.5 lb

## 2018-03-26 VITALS — BP 145/90 | HR 79 | Temp 97.7°F | Resp 18

## 2018-03-26 DIAGNOSIS — C9200 Acute myeloblastic leukemia, not having achieved remission: Secondary | ICD-10-CM

## 2018-03-26 DIAGNOSIS — D696 Thrombocytopenia, unspecified: Secondary | ICD-10-CM | POA: Diagnosis not present

## 2018-03-26 DIAGNOSIS — D649 Anemia, unspecified: Secondary | ICD-10-CM

## 2018-03-26 DIAGNOSIS — T451X5A Adverse effect of antineoplastic and immunosuppressive drugs, initial encounter: Secondary | ICD-10-CM

## 2018-03-26 DIAGNOSIS — D709 Neutropenia, unspecified: Secondary | ICD-10-CM | POA: Diagnosis not present

## 2018-03-26 DIAGNOSIS — Z5111 Encounter for antineoplastic chemotherapy: Secondary | ICD-10-CM | POA: Diagnosis not present

## 2018-03-26 DIAGNOSIS — D6481 Anemia due to antineoplastic chemotherapy: Secondary | ICD-10-CM | POA: Diagnosis not present

## 2018-03-26 LAB — CBC WITH DIFFERENTIAL/PLATELET
ABS IMMATURE GRANULOCYTES: 0 10*3/uL (ref 0.00–0.07)
Basophils Absolute: 0 10*3/uL (ref 0.0–0.1)
Basophils Relative: 0 %
Eosinophils Absolute: 0 10*3/uL (ref 0.0–0.5)
Eosinophils Relative: 0 %
HCT: 21 % — ABNORMAL LOW (ref 36.0–46.0)
HEMOGLOBIN: 7.3 g/dL — AB (ref 12.0–15.0)
Immature Granulocytes: 0 %
LYMPHS PCT: 52 %
Lymphs Abs: 0.7 10*3/uL (ref 0.7–4.0)
MCH: 29 pg (ref 26.0–34.0)
MCHC: 34.8 g/dL (ref 30.0–36.0)
MCV: 83.3 fL (ref 80.0–100.0)
Monocytes Absolute: 0.6 10*3/uL (ref 0.1–1.0)
Monocytes Relative: 45 %
Neutro Abs: 0 10*3/uL — ABNORMAL LOW (ref 1.7–7.7)
Neutrophils Relative %: 3 %
Platelets: 169 10*3/uL (ref 150–400)
RBC: 2.52 MIL/uL — ABNORMAL LOW (ref 3.87–5.11)
RDW: 12.3 % (ref 11.5–15.5)
WBC: 1.3 10*3/uL — CL (ref 4.0–10.5)
nRBC: 0 % (ref 0.0–0.2)

## 2018-03-26 LAB — COMPREHENSIVE METABOLIC PANEL
ALT: 27 U/L (ref 0–44)
AST: 19 U/L (ref 15–41)
Albumin: 3.8 g/dL (ref 3.5–5.0)
Alkaline Phosphatase: 75 U/L (ref 38–126)
Anion gap: 5 (ref 5–15)
BUN: 13 mg/dL (ref 6–20)
CO2: 25 mmol/L (ref 22–32)
Calcium: 8.9 mg/dL (ref 8.9–10.3)
Chloride: 107 mmol/L (ref 98–111)
Creatinine, Ser: 1.24 mg/dL — ABNORMAL HIGH (ref 0.44–1.00)
GFR calc Af Amer: 60 mL/min — ABNORMAL LOW (ref >60)
GFR calc non Af Amer: 52 mL/min — ABNORMAL LOW (ref >60)
GLUCOSE: 106 mg/dL — AB (ref 70–99)
Potassium: 3.5 mmol/L (ref 3.5–5.1)
Sodium: 137 mmol/L (ref 135–145)
Total Bilirubin: 0.7 mg/dL (ref 0.3–1.2)
Total Protein: 7.4 g/dL (ref 6.5–8.1)

## 2018-03-26 LAB — PREPARE RBC (CROSSMATCH)

## 2018-03-26 MED ORDER — ACETAMINOPHEN 325 MG PO TABS
650.0000 mg | ORAL_TABLET | Freq: Once | ORAL | Status: AC
  Administered 2018-03-26: 650 mg via ORAL
  Filled 2018-03-26: qty 2

## 2018-03-26 MED ORDER — SODIUM CHLORIDE 0.9 % IV SOLN
Freq: Once | INTRAVENOUS | Status: AC
  Administered 2018-03-26: 10:00:00 via INTRAVENOUS
  Filled 2018-03-26: qty 250

## 2018-03-26 MED ORDER — PROCHLORPERAZINE MALEATE 10 MG PO TABS
10.0000 mg | ORAL_TABLET | Freq: Once | ORAL | Status: AC
  Administered 2018-03-26: 10 mg via ORAL
  Filled 2018-03-26: qty 1

## 2018-03-26 MED ORDER — DIPHENHYDRAMINE HCL 25 MG PO CAPS
25.0000 mg | ORAL_CAPSULE | Freq: Once | ORAL | Status: AC
  Administered 2018-03-26: 25 mg via ORAL
  Filled 2018-03-26: qty 1

## 2018-03-26 MED ORDER — SODIUM CHLORIDE 0.9% FLUSH
10.0000 mL | Freq: Once | INTRAVENOUS | Status: AC
  Administered 2018-03-26: 10 mL via INTRAVENOUS
  Filled 2018-03-26: qty 10

## 2018-03-26 MED ORDER — SODIUM CHLORIDE 0.9% IV SOLUTION
250.0000 mL | Freq: Once | INTRAVENOUS | Status: AC
  Filled 2018-03-26: qty 250

## 2018-03-26 MED ORDER — SODIUM CHLORIDE 0.9 % IV SOLN
20.0000 mg/m2 | Freq: Once | INTRAVENOUS | Status: AC
  Administered 2018-03-26: 40 mg via INTRAVENOUS
  Filled 2018-03-26: qty 8

## 2018-03-26 MED ORDER — HEPARIN SOD (PORK) LOCK FLUSH 100 UNIT/ML IV SOLN
500.0000 [IU] | Freq: Once | INTRAVENOUS | Status: AC
  Administered 2018-03-26: 500 [IU] via INTRAVENOUS
  Filled 2018-03-26: qty 5
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
## 2018-03-26 NOTE — Progress Notes (Signed)
Patient here for follow up. No new concerns voiced.  °

## 2018-03-27 ENCOUNTER — Other Ambulatory Visit: Payer: Self-pay

## 2018-03-27 ENCOUNTER — Inpatient Hospital Stay: Payer: 59

## 2018-03-27 VITALS — BP 126/85 | HR 75 | Temp 96.0°F | Resp 18

## 2018-03-27 DIAGNOSIS — Z5111 Encounter for antineoplastic chemotherapy: Secondary | ICD-10-CM | POA: Diagnosis not present

## 2018-03-27 DIAGNOSIS — C9200 Acute myeloblastic leukemia, not having achieved remission: Secondary | ICD-10-CM

## 2018-03-27 LAB — TYPE AND SCREEN
ABO/RH(D): O POS
Antibody Screen: NEGATIVE
Unit division: 0

## 2018-03-27 LAB — BPAM RBC
Blood Product Expiration Date: 202003222359
ISSUE DATE / TIME: 202003101211
Unit Type and Rh: 5100

## 2018-03-27 MED ORDER — HEPARIN SOD (PORK) LOCK FLUSH 100 UNIT/ML IV SOLN
250.0000 [IU] | Freq: Once | INTRAVENOUS | Status: AC | PRN
  Administered 2018-03-27: 250 [IU]
  Filled 2018-03-27: qty 5

## 2018-03-27 MED ORDER — SODIUM CHLORIDE 0.9 % IV SOLN
Freq: Once | INTRAVENOUS | Status: AC
  Administered 2018-03-27: 10:00:00 via INTRAVENOUS
  Filled 2018-03-27: qty 250

## 2018-03-27 MED ORDER — SODIUM CHLORIDE 0.9% FLUSH
10.0000 mL | INTRAVENOUS | Status: DC | PRN
  Administered 2018-03-27: 10 mL
  Filled 2018-03-27: qty 10

## 2018-03-27 MED ORDER — SODIUM CHLORIDE 0.9 % IV SOLN
20.0000 mg/m2 | Freq: Once | INTRAVENOUS | Status: AC
  Administered 2018-03-27: 40 mg via INTRAVENOUS
  Filled 2018-03-27: qty 8

## 2018-03-27 MED ORDER — PROCHLORPERAZINE MALEATE 10 MG PO TABS
10.0000 mg | ORAL_TABLET | Freq: Once | ORAL | Status: AC
  Administered 2018-03-27: 10 mg via ORAL
  Filled 2018-03-27: qty 1

## 2018-03-28 ENCOUNTER — Inpatient Hospital Stay: Payer: 59

## 2018-03-28 DIAGNOSIS — C9200 Acute myeloblastic leukemia, not having achieved remission: Secondary | ICD-10-CM

## 2018-03-28 DIAGNOSIS — Z5111 Encounter for antineoplastic chemotherapy: Secondary | ICD-10-CM | POA: Diagnosis not present

## 2018-03-28 LAB — CBC WITH DIFFERENTIAL/PLATELET
Abs Immature Granulocytes: 0 10*3/uL (ref 0.00–0.07)
Basophils Absolute: 0 10*3/uL (ref 0.0–0.1)
Basophils Relative: 0 %
Eosinophils Absolute: 0 10*3/uL (ref 0.0–0.5)
Eosinophils Relative: 0 %
HCT: 24.2 % — ABNORMAL LOW (ref 36.0–46.0)
Hemoglobin: 8.4 g/dL — ABNORMAL LOW (ref 12.0–15.0)
Immature Granulocytes: 0 %
Lymphocytes Relative: 51 %
Lymphs Abs: 0.8 10*3/uL (ref 0.7–4.0)
MCH: 28.8 pg (ref 26.0–34.0)
MCHC: 34.7 g/dL (ref 30.0–36.0)
MCV: 82.9 fL (ref 80.0–100.0)
Monocytes Absolute: 0.7 10*3/uL (ref 0.1–1.0)
Monocytes Relative: 46 %
Neutro Abs: 0.1 10*3/uL — ABNORMAL LOW (ref 1.7–7.7)
Neutrophils Relative %: 3 %
Platelets: 109 10*3/uL — ABNORMAL LOW (ref 150–400)
RBC: 2.92 MIL/uL — ABNORMAL LOW (ref 3.87–5.11)
RDW: 12.5 % (ref 11.5–15.5)
Smear Review: DECREASED
WBC: 1.5 10*3/uL — ABNORMAL LOW (ref 4.0–10.5)
nRBC: 0 % (ref 0.0–0.2)

## 2018-03-28 LAB — COMPREHENSIVE METABOLIC PANEL
ALT: 29 U/L (ref 0–44)
AST: 22 U/L (ref 15–41)
Albumin: 4 g/dL (ref 3.5–5.0)
Alkaline Phosphatase: 83 U/L (ref 38–126)
Anion gap: 8 (ref 5–15)
BUN: 13 mg/dL (ref 6–20)
CO2: 25 mmol/L (ref 22–32)
Calcium: 9.1 mg/dL (ref 8.9–10.3)
Chloride: 107 mmol/L (ref 98–111)
Creatinine, Ser: 1.07 mg/dL — ABNORMAL HIGH (ref 0.44–1.00)
GFR calc Af Amer: >60 mL/min (ref >60)
GFR calc non Af Amer: >60 mL/min (ref >60)
Glucose, Bld: 103 mg/dL — ABNORMAL HIGH (ref 70–99)
Potassium: 3.6 mmol/L (ref 3.5–5.1)
Sodium: 140 mmol/L (ref 135–145)
Total Bilirubin: 1 mg/dL (ref 0.3–1.2)
Total Protein: 7.6 g/dL (ref 6.5–8.1)

## 2018-03-28 MED ORDER — HEPARIN SOD (PORK) LOCK FLUSH 100 UNIT/ML IV SOLN
500.0000 [IU] | Freq: Once | INTRAVENOUS | Status: AC
  Administered 2018-03-28: 500 [IU] via INTRAVENOUS
  Filled 2018-03-28: qty 5

## 2018-03-28 MED ORDER — SODIUM CHLORIDE 0.9 % IV SOLN
Freq: Once | INTRAVENOUS | Status: AC
  Administered 2018-03-28: 11:00:00 via INTRAVENOUS
  Filled 2018-03-28: qty 250

## 2018-03-28 MED ORDER — SODIUM CHLORIDE 0.9 % IV SOLN
20.0000 mg/m2 | Freq: Once | INTRAVENOUS | Status: AC
  Administered 2018-03-28: 40 mg via INTRAVENOUS
  Filled 2018-03-28: qty 8

## 2018-03-28 MED ORDER — PROCHLORPERAZINE MALEATE 10 MG PO TABS
10.0000 mg | ORAL_TABLET | Freq: Once | ORAL | Status: AC
  Administered 2018-03-28: 10 mg via ORAL
  Filled 2018-03-28: qty 1

## 2018-03-28 MED ORDER — SODIUM CHLORIDE 0.9% FLUSH
10.0000 mL | INTRAVENOUS | Status: DC | PRN
  Administered 2018-03-28: 10 mL via INTRAVENOUS
  Filled 2018-03-28: qty 10

## 2018-03-29 ENCOUNTER — Inpatient Hospital Stay: Payer: 59

## 2018-03-29 ENCOUNTER — Other Ambulatory Visit: Payer: Self-pay

## 2018-03-29 VITALS — BP 143/84 | HR 87 | Temp 97.1°F | Resp 18

## 2018-03-29 DIAGNOSIS — Z5111 Encounter for antineoplastic chemotherapy: Secondary | ICD-10-CM | POA: Diagnosis not present

## 2018-03-29 DIAGNOSIS — C9200 Acute myeloblastic leukemia, not having achieved remission: Secondary | ICD-10-CM

## 2018-03-29 MED ORDER — HEPARIN SOD (PORK) LOCK FLUSH 100 UNIT/ML IV SOLN
500.0000 [IU] | Freq: Once | INTRAVENOUS | Status: AC
  Administered 2018-03-29: 500 [IU] via INTRAVENOUS

## 2018-03-29 MED ORDER — SODIUM CHLORIDE 0.9 % IV SOLN
Freq: Once | INTRAVENOUS | Status: AC
  Administered 2018-03-29: 10:00:00 via INTRAVENOUS
  Filled 2018-03-29: qty 250

## 2018-03-29 MED ORDER — SODIUM CHLORIDE 0.9 % IV SOLN
20.0000 mg/m2 | Freq: Once | INTRAVENOUS | Status: AC
  Administered 2018-03-29: 40 mg via INTRAVENOUS
  Filled 2018-03-29: qty 8

## 2018-03-29 MED ORDER — SODIUM CHLORIDE 0.9% FLUSH
10.0000 mL | INTRAVENOUS | Status: DC | PRN
  Administered 2018-03-29: 10 mL via INTRAVENOUS
  Filled 2018-03-29: qty 10

## 2018-03-29 MED ORDER — PROCHLORPERAZINE MALEATE 10 MG PO TABS
10.0000 mg | ORAL_TABLET | Freq: Once | ORAL | Status: AC
  Administered 2018-03-29: 10 mg via ORAL
  Filled 2018-03-29: qty 1

## 2018-04-02 ENCOUNTER — Other Ambulatory Visit: Payer: Self-pay

## 2018-04-02 ENCOUNTER — Inpatient Hospital Stay: Payer: 59

## 2018-04-02 ENCOUNTER — Other Ambulatory Visit: Payer: Self-pay | Admitting: Oncology

## 2018-04-02 ENCOUNTER — Other Ambulatory Visit: Payer: Self-pay | Admitting: *Deleted

## 2018-04-02 ENCOUNTER — Inpatient Hospital Stay (HOSPITAL_BASED_OUTPATIENT_CLINIC_OR_DEPARTMENT_OTHER): Payer: 59 | Admitting: Oncology

## 2018-04-02 VITALS — BP 142/85 | HR 87 | Temp 96.2°F | Resp 20

## 2018-04-02 DIAGNOSIS — R52 Pain, unspecified: Secondary | ICD-10-CM

## 2018-04-02 DIAGNOSIS — D649 Anemia, unspecified: Secondary | ICD-10-CM

## 2018-04-02 DIAGNOSIS — C9200 Acute myeloblastic leukemia, not having achieved remission: Secondary | ICD-10-CM

## 2018-04-02 DIAGNOSIS — D61818 Other pancytopenia: Secondary | ICD-10-CM | POA: Diagnosis not present

## 2018-04-02 DIAGNOSIS — Z5111 Encounter for antineoplastic chemotherapy: Secondary | ICD-10-CM | POA: Diagnosis not present

## 2018-04-02 DIAGNOSIS — R0982 Postnasal drip: Secondary | ICD-10-CM | POA: Diagnosis not present

## 2018-04-02 DIAGNOSIS — D709 Neutropenia, unspecified: Secondary | ICD-10-CM

## 2018-04-02 DIAGNOSIS — D696 Thrombocytopenia, unspecified: Secondary | ICD-10-CM

## 2018-04-02 LAB — CBC WITH DIFFERENTIAL/PLATELET
Abs Immature Granulocytes: 0 10*3/uL (ref 0.00–0.07)
Basophils Absolute: 0 10*3/uL (ref 0.0–0.1)
Basophils Relative: 0 %
Eosinophils Absolute: 0 10*3/uL (ref 0.0–0.5)
Eosinophils Relative: 0 %
HEMATOCRIT: 20.1 % — AB (ref 36.0–46.0)
Hemoglobin: 7 g/dL — ABNORMAL LOW (ref 12.0–15.0)
Immature Granulocytes: 0 %
Lymphocytes Relative: 60 %
Lymphs Abs: 0.5 10*3/uL — ABNORMAL LOW (ref 0.7–4.0)
MCH: 28.8 pg (ref 26.0–34.0)
MCHC: 34.8 g/dL (ref 30.0–36.0)
MCV: 82.7 fL (ref 80.0–100.0)
MONOS PCT: 38 %
Monocytes Absolute: 0.3 10*3/uL (ref 0.1–1.0)
Neutro Abs: 0 10*3/uL — ABNORMAL LOW (ref 1.7–7.7)
Neutrophils Relative %: 2 %
Platelets: 28 10*3/uL — CL (ref 150–400)
RBC: 2.43 MIL/uL — ABNORMAL LOW (ref 3.87–5.11)
RDW: 12.2 % (ref 11.5–15.5)
Smear Review: DECREASED
WBC: 0.8 10*3/uL — CL (ref 4.0–10.5)
nRBC: 0 % (ref 0.0–0.2)

## 2018-04-02 LAB — RESPIRATORY PANEL BY PCR
Adenovirus: NOT DETECTED
BORDETELLA PERTUSSIS-RVPCR: NOT DETECTED
CORONAVIRUS HKU1-RVPPCR: NOT DETECTED
Chlamydophila pneumoniae: NOT DETECTED
Coronavirus 229E: NOT DETECTED
Coronavirus NL63: NOT DETECTED
Coronavirus OC43: NOT DETECTED
Influenza A: NOT DETECTED
Influenza B: NOT DETECTED
Metapneumovirus: NOT DETECTED
Mycoplasma pneumoniae: NOT DETECTED
Parainfluenza Virus 1: NOT DETECTED
Parainfluenza Virus 2: NOT DETECTED
Parainfluenza Virus 3: NOT DETECTED
Parainfluenza Virus 4: NOT DETECTED
Respiratory Syncytial Virus: NOT DETECTED
Rhinovirus / Enterovirus: NOT DETECTED

## 2018-04-02 LAB — COMPREHENSIVE METABOLIC PANEL
ALT: 23 U/L (ref 0–44)
AST: 21 U/L (ref 15–41)
Albumin: 3.7 g/dL (ref 3.5–5.0)
Alkaline Phosphatase: 80 U/L (ref 38–126)
Anion gap: 7 (ref 5–15)
BUN: 11 mg/dL (ref 6–20)
CO2: 25 mmol/L (ref 22–32)
Calcium: 8.8 mg/dL — ABNORMAL LOW (ref 8.9–10.3)
Chloride: 107 mmol/L (ref 98–111)
Creatinine, Ser: 1.1 mg/dL — ABNORMAL HIGH (ref 0.44–1.00)
GFR calc Af Amer: >60 mL/min (ref >60)
GFR calc non Af Amer: 60 mL/min — ABNORMAL LOW (ref >60)
Glucose, Bld: 106 mg/dL — ABNORMAL HIGH (ref 70–99)
Potassium: 3.6 mmol/L (ref 3.5–5.1)
Sodium: 139 mmol/L (ref 135–145)
Total Bilirubin: 0.8 mg/dL (ref 0.3–1.2)
Total Protein: 7.1 g/dL (ref 6.5–8.1)

## 2018-04-02 LAB — SAMPLE TO BLOOD BANK

## 2018-04-02 LAB — PREPARE RBC (CROSSMATCH)

## 2018-04-02 MED ORDER — ACETAMINOPHEN 325 MG PO TABS
650.0000 mg | ORAL_TABLET | Freq: Once | ORAL | Status: AC
  Administered 2018-04-02: 650 mg via ORAL
  Filled 2018-04-02: qty 2

## 2018-04-02 MED ORDER — DIPHENHYDRAMINE HCL 25 MG PO CAPS
25.0000 mg | ORAL_CAPSULE | Freq: Once | ORAL | Status: AC
  Administered 2018-04-02: 25 mg via ORAL
  Filled 2018-04-02: qty 1

## 2018-04-02 MED ORDER — SODIUM CHLORIDE 0.9% FLUSH
10.0000 mL | INTRAVENOUS | Status: AC | PRN
  Administered 2018-04-02: 10 mL
  Filled 2018-04-02: qty 10

## 2018-04-02 MED ORDER — SODIUM CHLORIDE 0.9% IV SOLUTION
250.0000 mL | Freq: Once | INTRAVENOUS | Status: AC
  Administered 2018-04-02: 250 mL via INTRAVENOUS
  Filled 2018-04-02: qty 250

## 2018-04-02 MED ORDER — HEPARIN SOD (PORK) LOCK FLUSH 100 UNIT/ML IV SOLN
250.0000 [IU] | INTRAVENOUS | Status: AC | PRN
  Administered 2018-04-02: 250 [IU]
  Filled 2018-04-02 (×2): qty 5
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
## 2018-04-02 NOTE — Progress Notes (Signed)
Patient here today for a lab and possible blood transfusion.  Patient request to see dr Rhonda Kane today as she is having body aches and joint pain.

## 2018-04-02 NOTE — Progress Notes (Signed)
Patient states, "I am having muscle and joint aches all over. My pain level is 8 right now. I haven't been running a fever." All vital signs stable. MD, Dr. Tasia Catchings, notified and aware. Per MD order: add patient on to be seen by MD today.

## 2018-04-03 LAB — TYPE AND SCREEN
ABO/RH(D): O POS
ANTIBODY SCREEN: NEGATIVE
Unit division: 0

## 2018-04-03 LAB — BPAM RBC
Blood Product Expiration Date: 202004092359
ISSUE DATE / TIME: 202003171349
UNIT TYPE AND RH: 5100

## 2018-04-04 ENCOUNTER — Other Ambulatory Visit: Payer: Self-pay | Admitting: Oncology

## 2018-04-04 ENCOUNTER — Other Ambulatory Visit: Payer: Self-pay

## 2018-04-04 ENCOUNTER — Inpatient Hospital Stay: Payer: 59

## 2018-04-04 DIAGNOSIS — D696 Thrombocytopenia, unspecified: Secondary | ICD-10-CM

## 2018-04-04 DIAGNOSIS — C9200 Acute myeloblastic leukemia, not having achieved remission: Secondary | ICD-10-CM

## 2018-04-04 DIAGNOSIS — Z5111 Encounter for antineoplastic chemotherapy: Secondary | ICD-10-CM | POA: Diagnosis not present

## 2018-04-04 DIAGNOSIS — D649 Anemia, unspecified: Secondary | ICD-10-CM

## 2018-04-04 LAB — COMPREHENSIVE METABOLIC PANEL
ALT: 19 U/L (ref 0–44)
AST: 20 U/L (ref 15–41)
Albumin: 3.7 g/dL (ref 3.5–5.0)
Alkaline Phosphatase: 76 U/L (ref 38–126)
Anion gap: 7 (ref 5–15)
BUN: 14 mg/dL (ref 6–20)
CHLORIDE: 106 mmol/L (ref 98–111)
CO2: 24 mmol/L (ref 22–32)
CREATININE: 1.16 mg/dL — AB (ref 0.44–1.00)
Calcium: 8.8 mg/dL — ABNORMAL LOW (ref 8.9–10.3)
GFR calc Af Amer: >60 mL/min (ref >60)
GFR calc non Af Amer: 56 mL/min — ABNORMAL LOW (ref >60)
Glucose, Bld: 159 mg/dL — ABNORMAL HIGH (ref 70–99)
Potassium: 3.4 mmol/L — ABNORMAL LOW (ref 3.5–5.1)
Sodium: 137 mmol/L (ref 135–145)
Total Bilirubin: 0.8 mg/dL (ref 0.3–1.2)
Total Protein: 7.1 g/dL (ref 6.5–8.1)

## 2018-04-04 LAB — CBC WITH DIFFERENTIAL/PLATELET
Abs Immature Granulocytes: 0 10*3/uL (ref 0.00–0.07)
Basophils Absolute: 0 10*3/uL (ref 0.0–0.1)
Basophils Relative: 0 %
EOS ABS: 0 10*3/uL (ref 0.0–0.5)
Eosinophils Relative: 0 %
HCT: 22.7 % — ABNORMAL LOW (ref 36.0–46.0)
HEMOGLOBIN: 7.9 g/dL — AB (ref 12.0–15.0)
Immature Granulocytes: 0 %
LYMPHS ABS: 0.5 10*3/uL — AB (ref 0.7–4.0)
LYMPHS PCT: 56 %
MCH: 28.4 pg (ref 26.0–34.0)
MCHC: 34.8 g/dL (ref 30.0–36.0)
MCV: 81.7 fL (ref 80.0–100.0)
Monocytes Absolute: 0.4 10*3/uL (ref 0.1–1.0)
Monocytes Relative: 43 %
Neutro Abs: 0 10*3/uL — ABNORMAL LOW (ref 1.7–7.7)
Neutrophils Relative %: 1 %
Platelets: 17 10*3/uL — CL (ref 150–400)
RBC: 2.78 MIL/uL — ABNORMAL LOW (ref 3.87–5.11)
RDW: 12.1 % (ref 11.5–15.5)
WBC: 0.9 10*3/uL — CL (ref 4.0–10.5)
nRBC: 0 % (ref 0.0–0.2)

## 2018-04-04 MED ORDER — HEPARIN SOD (PORK) LOCK FLUSH 100 UNIT/ML IV SOLN
INTRAVENOUS | Status: AC
  Filled 2018-04-04: qty 5

## 2018-04-04 MED ORDER — HEPARIN SOD (PORK) LOCK FLUSH 100 UNIT/ML IV SOLN
500.0000 [IU] | Freq: Once | INTRAVENOUS | Status: AC
  Administered 2018-04-04: 500 [IU] via INTRAVENOUS

## 2018-04-04 MED ORDER — SODIUM CHLORIDE 0.9% FLUSH
10.0000 mL | Freq: Once | INTRAVENOUS | Status: AC
  Administered 2018-04-04: 10 mL via INTRAVENOUS
  Filled 2018-04-04: qty 10

## 2018-04-08 ENCOUNTER — Telehealth: Payer: Self-pay | Admitting: *Deleted

## 2018-04-08 DIAGNOSIS — C92A0 Acute myeloid leukemia with multilineage dysplasia, not having achieved remission: Secondary | ICD-10-CM | POA: Diagnosis not present

## 2018-04-08 NOTE — Telephone Encounter (Addendum)
Amy coordinator from [facility] called wanting [place] to drawn labs and do blood transfusion if needed on this patient this week. She asks that we reach out to the patient for an appointment.

## 2018-04-08 NOTE — Telephone Encounter (Signed)
Patient's husband returned my call.  He states that a lab / poss blood is needed for thurs morning.  Let him know that a scheduler will contact him with this appt tomorrow morning.

## 2018-04-08 NOTE — Telephone Encounter (Signed)
Left message for pt to return my call.

## 2018-04-09 ENCOUNTER — Telehealth: Payer: Self-pay | Admitting: *Deleted

## 2018-04-09 ENCOUNTER — Inpatient Hospital Stay: Payer: 59

## 2018-04-09 ENCOUNTER — Inpatient Hospital Stay
Admission: EM | Admit: 2018-04-09 | Discharge: 2018-04-14 | DRG: 809 | Disposition: A | Discharge status: Home / Self Care | Payer: 59 | Attending: Internal Medicine | Admitting: Internal Medicine

## 2018-04-09 ENCOUNTER — Other Ambulatory Visit: Payer: Self-pay

## 2018-04-09 ENCOUNTER — Encounter: Payer: Self-pay | Admitting: Intensive Care

## 2018-04-09 ENCOUNTER — Emergency Department: Payer: 59

## 2018-04-09 DIAGNOSIS — N921 Excessive and frequent menstruation with irregular cycle: Secondary | ICD-10-CM | POA: Diagnosis present

## 2018-04-09 DIAGNOSIS — Z792 Long term (current) use of antibiotics: Secondary | ICD-10-CM | POA: Diagnosis not present

## 2018-04-09 DIAGNOSIS — Z20828 Contact with and (suspected) exposure to other viral communicable diseases: Secondary | ICD-10-CM

## 2018-04-09 DIAGNOSIS — D696 Thrombocytopenia, unspecified: Secondary | ICD-10-CM

## 2018-04-09 DIAGNOSIS — Z9049 Acquired absence of other specified parts of digestive tract: Secondary | ICD-10-CM

## 2018-04-09 DIAGNOSIS — I427 Cardiomyopathy due to drug and external agent: Secondary | ICD-10-CM | POA: Diagnosis not present

## 2018-04-09 DIAGNOSIS — G4700 Insomnia, unspecified: Secondary | ICD-10-CM | POA: Diagnosis present

## 2018-04-09 DIAGNOSIS — R0902 Hypoxemia: Secondary | ICD-10-CM | POA: Diagnosis not present

## 2018-04-09 DIAGNOSIS — D649 Anemia, unspecified: Secondary | ICD-10-CM

## 2018-04-09 DIAGNOSIS — Z825 Family history of asthma and other chronic lower respiratory diseases: Secondary | ICD-10-CM

## 2018-04-09 DIAGNOSIS — T451X5A Adverse effect of antineoplastic and immunosuppressive drugs, initial encounter: Secondary | ICD-10-CM | POA: Diagnosis not present

## 2018-04-09 DIAGNOSIS — R079 Chest pain, unspecified: Secondary | ICD-10-CM

## 2018-04-09 DIAGNOSIS — Z79899 Other long term (current) drug therapy: Secondary | ICD-10-CM

## 2018-04-09 DIAGNOSIS — A419 Sepsis, unspecified organism: Secondary | ICD-10-CM

## 2018-04-09 DIAGNOSIS — Z6837 Body mass index (BMI) 37.0-37.9, adult: Secondary | ICD-10-CM

## 2018-04-09 DIAGNOSIS — F419 Anxiety disorder, unspecified: Secondary | ICD-10-CM | POA: Diagnosis present

## 2018-04-09 DIAGNOSIS — C9200 Acute myeloblastic leukemia, not having achieved remission: Secondary | ICD-10-CM

## 2018-04-09 DIAGNOSIS — K219 Gastro-esophageal reflux disease without esophagitis: Secondary | ICD-10-CM | POA: Diagnosis present

## 2018-04-09 DIAGNOSIS — D701 Agranulocytosis secondary to cancer chemotherapy: Secondary | ICD-10-CM | POA: Diagnosis not present

## 2018-04-09 DIAGNOSIS — C9510 Chronic leukemia of unspecified cell type not having achieved remission: Secondary | ICD-10-CM | POA: Diagnosis not present

## 2018-04-09 DIAGNOSIS — I10 Essential (primary) hypertension: Secondary | ICD-10-CM | POA: Diagnosis present

## 2018-04-09 DIAGNOSIS — Z79891 Long term (current) use of opiate analgesic: Secondary | ICD-10-CM | POA: Diagnosis not present

## 2018-04-09 DIAGNOSIS — R918 Other nonspecific abnormal finding of lung field: Secondary | ICD-10-CM | POA: Diagnosis not present

## 2018-04-09 DIAGNOSIS — E876 Hypokalemia: Secondary | ICD-10-CM | POA: Diagnosis present

## 2018-04-09 DIAGNOSIS — Z95828 Presence of other vascular implants and grafts: Secondary | ICD-10-CM | POA: Diagnosis not present

## 2018-04-09 DIAGNOSIS — F329 Major depressive disorder, single episode, unspecified: Secondary | ICD-10-CM | POA: Diagnosis present

## 2018-04-09 DIAGNOSIS — D709 Neutropenia, unspecified: Secondary | ICD-10-CM

## 2018-04-09 DIAGNOSIS — E669 Obesity, unspecified: Secondary | ICD-10-CM | POA: Diagnosis present

## 2018-04-09 DIAGNOSIS — T50995A Adverse effect of other drugs, medicaments and biological substances, initial encounter: Secondary | ICD-10-CM | POA: Diagnosis present

## 2018-04-09 DIAGNOSIS — R5081 Fever presenting with conditions classified elsewhere: Secondary | ICD-10-CM | POA: Diagnosis present

## 2018-04-09 DIAGNOSIS — E8881 Metabolic syndrome: Secondary | ICD-10-CM | POA: Diagnosis present

## 2018-04-09 DIAGNOSIS — R0789 Other chest pain: Secondary | ICD-10-CM | POA: Diagnosis not present

## 2018-04-09 DIAGNOSIS — T451X5S Adverse effect of antineoplastic and immunosuppressive drugs, sequela: Secondary | ICD-10-CM | POA: Diagnosis not present

## 2018-04-09 DIAGNOSIS — D61818 Other pancytopenia: Secondary | ICD-10-CM | POA: Diagnosis not present

## 2018-04-09 DIAGNOSIS — R05 Cough: Secondary | ICD-10-CM | POA: Diagnosis not present

## 2018-04-09 LAB — COMPREHENSIVE METABOLIC PANEL
ALT: 25 U/L (ref 0–44)
ALT: 23 U/L (ref 0–44)
AST: 24 U/L (ref 15–41)
AST: 21 U/L (ref 15–41)
Albumin: 4.1 g/dL (ref 3.5–5.0)
Albumin: 3.7 g/dL (ref 3.5–5.0)
Alkaline Phosphatase: 77 U/L (ref 38–126)
Alkaline Phosphatase: 59 U/L (ref 38–126)
Anion gap: 11 (ref 5–15)
Anion gap: 7 (ref 5–15)
BILIRUBIN TOTAL: 1.1 mg/dL (ref 0.3–1.2)
BUN: 12 mg/dL (ref 6–20)
BUN: 13 mg/dL (ref 6–20)
CO2: 23 mmol/L (ref 22–32)
CO2: 25 mmol/L (ref 22–32)
Calcium: 8.9 mg/dL (ref 8.9–10.3)
Calcium: 8.6 mg/dL — ABNORMAL LOW (ref 8.9–10.3)
Chloride: 99 mmol/L (ref 98–111)
Chloride: 102 mmol/L (ref 98–111)
Creatinine, Ser: 1.12 mg/dL — ABNORMAL HIGH (ref 0.44–1.00)
Creatinine, Ser: 1.12 mg/dL — ABNORMAL HIGH (ref 0.44–1.00)
GFR calc Af Amer: >60 mL/min (ref >60)
GFR calc Af Amer: >60 mL/min (ref >60)
GFR calc non Af Amer: 58 mL/min — ABNORMAL LOW (ref >60)
GFR, EST NON AFRICAN AMERICAN: 58 mL/min — AB (ref >60)
GLUCOSE: 125 mg/dL — AB (ref 70–99)
Glucose, Bld: 118 mg/dL — ABNORMAL HIGH (ref 70–99)
Potassium: 3.6 mmol/L (ref 3.5–5.1)
Potassium: 3.1 mmol/L — ABNORMAL LOW (ref 3.5–5.1)
Sodium: 133 mmol/L — ABNORMAL LOW (ref 135–145)
Sodium: 134 mmol/L — ABNORMAL LOW (ref 135–145)
Total Bilirubin: 1.1 mg/dL (ref 0.3–1.2)
Total Protein: 8.2 g/dL — ABNORMAL HIGH (ref 6.5–8.1)
Total Protein: 6.8 g/dL (ref 6.5–8.1)

## 2018-04-09 LAB — CBC WITH DIFFERENTIAL/PLATELET
ABS IMMATURE GRANULOCYTES: 0 10*3/uL (ref 0.00–0.07)
Abs Immature Granulocytes: 0 10*3/uL (ref 0.00–0.07)
Basophils Absolute: 0 10*3/uL (ref 0.0–0.1)
Basophils Absolute: 0 10*3/uL (ref 0.0–0.1)
Basophils Relative: 0 %
Basophils Relative: 0 %
EOS PCT: 0 %
Eosinophils Absolute: 0 10*3/uL (ref 0.0–0.5)
Eosinophils Absolute: 0 10*3/uL (ref 0.0–0.5)
Eosinophils Relative: 0 %
HCT: 21 % — ABNORMAL LOW (ref 36.0–46.0)
HCT: 16.6 % — ABNORMAL LOW (ref 36.0–46.0)
Hemoglobin: 7.2 g/dL — ABNORMAL LOW (ref 12.0–15.0)
Hemoglobin: 5.9 g/dL — ABNORMAL LOW (ref 12.0–15.0)
Immature Granulocytes: 0 %
Immature Granulocytes: 0 %
LYMPHS ABS: 0.2 10*3/uL — AB (ref 0.7–4.0)
Lymphocytes Relative: 31 %
Lymphocytes Relative: 40 %
Lymphs Abs: 0.3 10*3/uL — ABNORMAL LOW (ref 0.7–4.0)
MCH: 28.3 pg (ref 26.0–34.0)
MCH: 29.1 pg (ref 26.0–34.0)
MCHC: 34.3 g/dL (ref 30.0–36.0)
MCHC: 35.5 g/dL (ref 30.0–36.0)
MCV: 82.7 fL (ref 80.0–100.0)
MCV: 81.8 fL (ref 80.0–100.0)
Monocytes Absolute: 0.6 10*3/uL (ref 0.1–1.0)
Monocytes Absolute: 0.3 10*3/uL (ref 0.1–1.0)
Monocytes Relative: 68 %
Monocytes Relative: 58 %
Neutro Abs: 0 10*3/uL — ABNORMAL LOW (ref 1.7–7.7)
Neutro Abs: 0 10*3/uL — ABNORMAL LOW (ref 1.7–7.7)
Neutrophils Relative %: 1 %
Neutrophils Relative %: 2 %
Platelets: 14 10*3/uL — CL (ref 150–400)
Platelets: 12 10*3/uL — CL (ref 150–400)
RBC: 2.54 MIL/uL — ABNORMAL LOW (ref 3.87–5.11)
RBC: 2.03 MIL/uL — ABNORMAL LOW (ref 3.87–5.11)
RDW: 11.9 % (ref 11.5–15.5)
RDW: 11.9 % (ref 11.5–15.5)
Smear Review: DECREASED
Smear Review: DECREASED
WBC: 0.9 10*3/uL — CL (ref 4.0–10.5)
WBC: 0.6 10*3/uL — CL (ref 4.0–10.5)
nRBC: 0 % (ref 0.0–0.2)
nRBC: 0 % (ref 0.0–0.2)

## 2018-04-09 LAB — URINALYSIS, COMPLETE (UACMP) WITH MICROSCOPIC
Bacteria, UA: NONE SEEN
Bilirubin Urine: NEGATIVE
Glucose, UA: NEGATIVE mg/dL
Ketones, ur: NEGATIVE mg/dL
Leukocytes,Ua: NEGATIVE
Nitrite: NEGATIVE
SPECIFIC GRAVITY, URINE: 1.015 (ref 1.005–1.030)
pH: 5.5 (ref 5.0–8.0)

## 2018-04-09 LAB — LACTIC ACID, PLASMA
LACTIC ACID, VENOUS: 0.5 mmol/L (ref 0.5–1.9)
Lactic Acid, Venous: 2.7 mmol/L (ref 0.5–1.9)
Lactic Acid, Venous: 0.9 mmol/L (ref 0.5–1.9)
Lactic Acid, Venous: 0.9 mmol/L (ref 0.5–1.9)

## 2018-04-09 LAB — INFLUENZA PANEL BY PCR (TYPE A & B)
Influenza A By PCR: NEGATIVE
Influenza B By PCR: NEGATIVE

## 2018-04-09 LAB — BLOOD GAS, VENOUS
Patient temperature: 37
pCO2, Ven: 47 mmHg (ref 44.0–60.0)
pH, Ven: 7.32 (ref 7.250–7.430)

## 2018-04-09 LAB — PROTIME-INR
INR: 1.3 — ABNORMAL HIGH (ref 0.8–1.2)
Prothrombin Time: 15.6 seconds — ABNORMAL HIGH (ref 11.4–15.2)

## 2018-04-09 LAB — PREPARE RBC (CROSSMATCH)

## 2018-04-09 LAB — PROCALCITONIN: Procalcitonin: 0.43 ng/mL

## 2018-04-09 LAB — TSH: TSH: 0.818 u[IU]/mL (ref 0.350–4.500)

## 2018-04-09 LAB — HEMOGLOBIN AND HEMATOCRIT, BLOOD
HCT: 15.7 % — ABNORMAL LOW (ref 36.0–46.0)
Hemoglobin: 5.3 g/dL — ABNORMAL LOW (ref 12.0–15.0)

## 2018-04-09 LAB — APTT: aPTT: 34 seconds (ref 24–36)

## 2018-04-09 MED ORDER — SODIUM CHLORIDE 0.9 % IV BOLUS (SEPSIS): 1000.0000 mL | Freq: Once | INTRAVENOUS | Status: DC

## 2018-04-09 MED ORDER — HEPARIN SOD (PORK) LOCK FLUSH 100 UNIT/ML IV SOLN: 250.0000 [IU] | INTRAVENOUS | Status: DC | PRN

## 2018-04-09 MED ORDER — LOSARTAN POTASSIUM 25 MG PO TABS
12.5000 mg | ORAL_TABLET | Freq: Every day | ORAL | Status: DC
  Administered 2018-04-09 – 2018-04-10 (×2): 12.5 mg via ORAL
  Filled 2018-04-09 (×2): qty 1

## 2018-04-09 MED ORDER — FUROSEMIDE 20 MG PO TABS
20.0000 mg | ORAL_TABLET | Freq: Every day | ORAL | Status: DC | PRN
  Filled 2018-04-09 (×2): qty 1

## 2018-04-09 MED ORDER — POTASSIUM CHLORIDE CRYS ER 20 MEQ PO TBCR
20.0000 meq | EXTENDED_RELEASE_TABLET | Freq: Every day | ORAL | Status: DC
  Administered 2018-04-09 – 2018-04-13 (×5): 20 meq via ORAL
  Filled 2018-04-09 (×7): qty 1

## 2018-04-09 MED ORDER — VANCOMYCIN HCL IN DEXTROSE 1-5 GM/200ML-% IV SOLN
1000.0000 mg | Freq: Once | INTRAVENOUS | Status: AC
  Administered 2018-04-09: 17:00:00 1000 mg via INTRAVENOUS
  Filled 2018-04-09: qty 200

## 2018-04-09 MED ORDER — VALACYCLOVIR HCL 500 MG PO TABS
500.0000 mg | ORAL_TABLET | Freq: Every day | ORAL | Status: DC
  Administered 2018-04-09: 22:00:00 500 mg via ORAL
  Filled 2018-04-09 (×2): qty 1

## 2018-04-09 MED ORDER — VANCOMYCIN HCL IN DEXTROSE 1-5 GM/200ML-% IV SOLN
1000.0000 mg | Freq: Once | INTRAVENOUS | Status: AC
  Administered 2018-04-09: 1000 mg via INTRAVENOUS
  Filled 2018-04-09: qty 200

## 2018-04-09 MED ORDER — SODIUM CHLORIDE 0.9 % IV SOLN
INTRAVENOUS | Status: DC
  Administered 2018-04-09: 17:00:00 100 mL/h via INTRAVENOUS
  Administered 2018-04-11: 05:00:00 via INTRAVENOUS

## 2018-04-09 MED ORDER — SODIUM CHLORIDE 0.9 % IV BOLUS (SEPSIS)
1000.0000 mL | Freq: Once | INTRAVENOUS | Status: AC
  Administered 2018-04-09: 1000 mL via INTRAVENOUS

## 2018-04-09 MED ORDER — SODIUM CHLORIDE 0.9% FLUSH: 3.0000 mL | INTRAVENOUS | Status: DC | PRN

## 2018-04-09 MED ORDER — CARVEDILOL 6.25 MG PO TABS
6.2500 mg | ORAL_TABLET | Freq: Two times a day (BID) | ORAL | Status: DC
  Administered 2018-04-09 – 2018-04-14 (×10): 6.25 mg via ORAL
  Filled 2018-04-09: qty 2
  Filled 2018-04-09: qty 1
  Filled 2018-04-09 (×3): qty 2
  Filled 2018-04-09 (×2): qty 1
  Filled 2018-04-09: qty 2
  Filled 2018-04-09: qty 1
  Filled 2018-04-09: qty 2

## 2018-04-09 MED ORDER — ONDANSETRON HCL 4 MG/2ML IJ SOLN
4.0000 mg | Freq: Four times a day (QID) | INTRAMUSCULAR | Status: DC | PRN
  Filled 2018-04-09: qty 2

## 2018-04-09 MED ORDER — POSACONAZOLE 100 MG PO TBEC
300.0000 mg | DELAYED_RELEASE_TABLET | Freq: Every day | ORAL | Status: DC
  Administered 2018-04-10 – 2018-04-14 (×5): 300 mg via ORAL
  Filled 2018-04-09 (×5): qty 3

## 2018-04-09 MED ORDER — SODIUM CHLORIDE 0.9% IV SOLUTION
250.0000 mL | Freq: Once | INTRAVENOUS | Status: AC
  Administered 2018-04-09: 250 mL via INTRAVENOUS

## 2018-04-09 MED ORDER — SODIUM CHLORIDE 0.9% FLUSH
10.0000 mL | INTRAVENOUS | Status: AC | PRN
  Administered 2018-04-13: 10 mL

## 2018-04-09 MED ORDER — PROCHLORPERAZINE MALEATE 5 MG PO TABS
5.0000 mg | ORAL_TABLET | Freq: Three times a day (TID) | ORAL | Status: DC | PRN
  Filled 2018-04-09: qty 1

## 2018-04-09 MED ORDER — ACETAMINOPHEN 650 MG RE SUPP: 650.0000 mg | Freq: Four times a day (QID) | RECTAL | Status: DC | PRN

## 2018-04-09 MED ORDER — TRAZODONE HCL 50 MG PO TABS
50.0000 mg | ORAL_TABLET | Freq: Every day | ORAL | Status: DC | PRN
  Administered 2018-04-09 – 2018-04-11 (×3): 50 mg via ORAL
  Filled 2018-04-09 (×3): qty 1

## 2018-04-09 MED ORDER — VANCOMYCIN HCL 1000 MG IV SOLR
1000.0000 mg | Freq: Once | INTRAVENOUS | Status: DC
  Filled 2018-04-09: qty 1000

## 2018-04-09 MED ORDER — ONDANSETRON HCL 4 MG PO TABS: 4.0000 mg | ORAL_TABLET | Freq: Four times a day (QID) | ORAL | Status: DC | PRN

## 2018-04-09 MED ORDER — SODIUM CHLORIDE 0.9 % IV SOLN: 2.0000 g | Freq: Once | INTRAVENOUS | Status: DC

## 2018-04-09 MED ORDER — ACETAMINOPHEN 325 MG PO TABS
650.0000 mg | ORAL_TABLET | Freq: Four times a day (QID) | ORAL | Status: DC | PRN
  Administered 2018-04-09 – 2018-04-11 (×3): 650 mg via ORAL
  Filled 2018-04-09 (×3): qty 2

## 2018-04-09 MED ORDER — VANCOMYCIN HCL 10 G IV SOLR
1250.0000 mg | INTRAVENOUS | Status: DC
  Filled 2018-04-09 (×2): qty 1250

## 2018-04-09 MED ORDER — SODIUM CHLORIDE 0.9 % IV SOLN
2.0000 g | Freq: Three times a day (TID) | INTRAVENOUS | Status: DC
  Administered 2018-04-09 – 2018-04-14 (×14): 2 g via INTRAVENOUS
  Filled 2018-04-09 (×20): qty 2

## 2018-04-09 MED ORDER — VENLAFAXINE HCL ER 150 MG PO CP24
300.0000 mg | ORAL_CAPSULE | Freq: Every day | ORAL | Status: DC
  Administered 2018-04-09 – 2018-04-14 (×6): 300 mg via ORAL
  Filled 2018-04-09: qty 4
  Filled 2018-04-09: qty 2
  Filled 2018-04-09 (×2): qty 4
  Filled 2018-04-09 (×3): qty 2
  Filled 2018-04-09: qty 4
  Filled 2018-04-09 (×2): qty 2

## 2018-04-09 MED ORDER — IBUPROFEN 800 MG PO TABS
800.0000 mg | ORAL_TABLET | Freq: Once | ORAL | Status: AC
  Administered 2018-04-09: 800 mg via ORAL
  Filled 2018-04-09: qty 1

## 2018-04-09 MED ORDER — PANTOPRAZOLE SODIUM 40 MG PO TBEC
40.0000 mg | DELAYED_RELEASE_TABLET | Freq: Every day | ORAL | Status: DC
  Administered 2018-04-09 – 2018-04-14 (×6): 40 mg via ORAL
  Filled 2018-04-09 (×6): qty 1

## 2018-04-09 MED ORDER — SODIUM CHLORIDE 0.9 % IV BOLUS (SEPSIS)
1000.0000 mL | Freq: Once | INTRAVENOUS | Status: AC
  Administered 2018-04-09: 15:00:00 1000 mL via INTRAVENOUS

## 2018-04-09 MED ORDER — METRONIDAZOLE IN NACL 5-0.79 MG/ML-% IV SOLN
500.0000 mg | Freq: Three times a day (TID) | INTRAVENOUS | Status: DC
  Administered 2018-04-09 – 2018-04-12 (×10): 500 mg via INTRAVENOUS
  Filled 2018-04-09 (×12): qty 100

## 2018-04-09 MED ORDER — BUSPIRONE HCL 5 MG PO TABS
5.0000 mg | ORAL_TABLET | Freq: Two times a day (BID) | ORAL | Status: DC
  Administered 2018-04-09 – 2018-04-14 (×10): 5 mg via ORAL
  Filled 2018-04-09 (×11): qty 1

## 2018-04-09 MED ORDER — HYDROCODONE-ACETAMINOPHEN 5-325 MG PO TABS
1.0000 | ORAL_TABLET | ORAL | Status: DC | PRN
  Administered 2018-04-10 (×3): 2 via ORAL
  Filled 2018-04-09 (×3): qty 2

## 2018-04-09 MED ORDER — SODIUM CHLORIDE 0.9 % IV SOLN
2.0000 g | Freq: Two times a day (BID) | INTRAVENOUS | Status: DC
  Administered 2018-04-09: 2 g via INTRAVENOUS
  Filled 2018-04-09: qty 2

## 2018-04-09 MED ORDER — DOCUSATE SODIUM 100 MG PO CAPS
100.0000 mg | ORAL_CAPSULE | Freq: Two times a day (BID) | ORAL | Status: DC
  Administered 2018-04-10 – 2018-04-14 (×5): 100 mg via ORAL
  Filled 2018-04-09 (×10): qty 1

## 2018-04-09 MED ORDER — POLYETHYLENE GLYCOL 3350 17 G PO PACK: 17.0000 g | PACK | Freq: Every day | ORAL | Status: DC | PRN

## 2018-04-09 MED ORDER — HEPARIN SOD (PORK) LOCK FLUSH 100 UNIT/ML IV SOLN: 500.0000 [IU] | Freq: Every day | INTRAVENOUS | Status: DC | PRN

## 2018-04-09 MED ORDER — CHLORHEXIDINE GLUCONATE 0.12 % MT SOLN
10.0000 mL | Freq: Two times a day (BID) | OROMUCOSAL | Status: DC
  Administered 2018-04-09 – 2018-04-14 (×4): 10 mL via OROMUCOSAL
  Filled 2018-04-09 (×7): qty 15

## 2018-04-09 MED ORDER — IBUPROFEN 400 MG PO TABS
600.0000 mg | ORAL_TABLET | Freq: Every day | ORAL | Status: DC | PRN
  Administered 2018-04-09: 600 mg via ORAL
  Filled 2018-04-09: qty 2

## 2018-04-09 MED ORDER — ACETAMINOPHEN 500 MG PO TABS: 1000.0000 mg | ORAL_TABLET | Freq: Once | ORAL | Status: DC

## 2018-04-09 NOTE — Progress Notes (Signed)
Family Meeting Note  Advance Directive:yes  Today a meeting took place with the Patient.  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

## 2018-04-09 NOTE — ED Triage Notes (Addendum)
Patient arrived by EMS from home for fever and cough. Fever started yesterday after going to [facility] and having bone biospy done. Also received platelets. Reports highest fever at home 102.0. c/o headache. Last took tylenol at 0700 today

## 2018-04-09 NOTE — Consult Note (Signed)
CODE SEPSIS - PHARMACY COMMUNICATION  **Broad Spectrum Antibiotics should be administered within 1 hour of Sepsis diagnosis**  Time Code Sepsis Called/Page Received: 1151  Antibiotics Ordered: Cefepime/Vancomycin  Time of 1st antibiotic administration: 1306  Additional action taken by pharmacy: Reached out to Dr Jimmye Norman @ 1257 - he was ordering abx  If necessary, Name of Provider/Nurse Contacted: Dr Darletta Moll, PharmD, BCPS Clinical Pharmacist 04/09/2018 1:12 PM

## 2018-04-09 NOTE — Telephone Encounter (Signed)
Called pt, pt retook temp, now reading 99.5, she did take tylenol this morning.  Pt does not want to come to the hospital if at all possible, she states that she is concerned about picking up viruses.    Dr Tasia Catchings called pt, was able to convince her to go to the ED to be evaluated.  I called ED, spoke with nurse 1, advised them of patient symptoms/dx.

## 2018-04-09 NOTE — Progress Notes (Addendum)
CRITICAL VALUE ALERT  Critical Value:  Hemoglobin 5.9/ Hematocrit 16.6  Date & Time Notied:  Paged via Amion  Provider Notified: Salary  Orders Received/Actions taken: No actions/ Recheck H&H

## 2018-04-09 NOTE — H&P (Signed)
Rhonda Kane NAME: Rhonda Kane    MR#:  [id number]  DATE OF BIRTH:  1970-04-16  DATE OF ADMISSION:  04/09/2018  PRIMARY CARE PHYSICIAN: Maryland Pink, MD   REQUESTING/REFERRING PHYSICIAN:   CHIEF COMPLAINT:   Chief Complaint  Patient presents with  . Fever    HISTORY OF PRESENT ILLNESS: Rhonda Kane  is a 48 y.o. 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-on chemotherapy, last treatment was 2 weeks ago, was at [facility] on yesterday-had bone marrow biopsy as well as 2 unit platelet transfusion, developed chills that started on yesterday, fevers today, nonproductive infrequent cough, nausea, denies any sick contacts, brought to the emergency room via EMS, noted mild tachycardia, tachypnea, white count 0.9 which is stable, hemoglobin 7.2-stable, platelet count 14 down from 17, sodium 133, UA negative, chest x-ray negative, sepsis protocol started in the emergency room, started on vancomycin/cefepime, patient valuated emergency room, no apparent distress, resting comfortably in bed, patient is now been admitted for acute neutropenic fever with associated sepsis, acute on chronic pancytopenia without evidence for bleeding.  PAST MEDICAL HISTORY:   Past Medical History:  Diagnosis Date  . Breast mass, right 2015  . Depression   . Dysmenorrhea   . GERD (gastroesophageal reflux disease)   . Headache    MIGRAINES  . Insomnia   . Insulin resistance   . Irregular menses   . Menometrorrhagia   . Migraine with aura   . PMS (premenstrual syndrome)   . PONV (postoperative nausea and vomiting)     PAST SURGICAL HISTORY:  Past Surgical History:  Procedure Laterality Date  . ANKLE ARTHROSCOPY Right 05/07/2015   Procedure: ANKLE ARTHROSCOPY  / WITH TENOLYSIS;  Surgeon: [name], DPM;  Location: [facility] ORS;  Service: Podiatry;  Laterality: Right;  right ankle arthroscopy with debridement,  osteochondral repair, tenolysis of posterior tibial tendon  . BREAST FIBROADENOMA SURGERY Right 03/10/2013  . CARPAL TUNNEL RELEASE Left 09/11/2014   Procedure: CARPAL TUNNEL RELEASE;  Surgeon: Christophe Louis, MD;  Location: [facility] ORS;  Service: Orthopedics;  Laterality: Left;  . CHOLECYSTECTOMY      SOCIAL HISTORY:  Social History   Tobacco Use  . Smoking status: Never Smoker  . Smokeless tobacco: Never Used  Substance Use Topics  . Alcohol use: No    FAMILY HISTORY:  Family History  Problem Relation Age of Onset  . COPD Mother   . COPD Father     DRUG ALLERGIES: No Known Allergies  REVIEW OF SYSTEMS:   CONSTITUTIONAL: + fever, fatigue, weakness, chills.  EYES: No blurred or double vision.  EARS, NOSE, AND THROAT: No tinnitus or ear pain.  RESPIRATORY: No cough, shortness of breath, wheezing or hemoptysis.  CARDIOVASCULAR: No chest pain, orthopnea, edema.  GASTROINTESTINAL: + nausea, no vomiting, diarrhea or abdominal pain.  GENITOURINARY: No dysuria, hematuria.  ENDOCRINE: No polyuria, nocturia,  HEMATOLOGY: No anemia, easy bruising or bleeding SKIN: No rash or lesion. MUSCULOSKELETAL: No joint pain or arthritis.   NEUROLOGIC: No tingling, numbness, weakness.  PSYCHIATRY: No anxiety or depression.   MEDICATIONS AT HOME:  Prior to Admission medications   Medication Sig Start Date End Date Taking? Authorizing Provider  busPIRone (BUSPAR) 5 MG tablet Take 5 mg by mouth 2 (two) times daily. 12/11/17 12/11/18  [provider]  carvedilol (COREG) 6.25 MG tablet Take 6.25 mg by mouth 2 (two) times daily. 01/26/18   [provider]  chlorhexidine (PERIDEX) 0.12 % solution Use as directed 10 mLs in the mouth or throat 2 (two) times daily. 02/18/18   Earlie Server, MD  furosemide (LASIX) 20 MG tablet Take 20 mg by mouth daily as needed.  01/26/18   [provider]  ibuprofen (ADVIL,MOTRIN) 600 MG tablet Take 600 mg by mouth daily as needed.    [provider]  levofloxacin (LEVAQUIN) 500 MG tablet Take 500 mg by mouth daily. 03/06/18   [provider]  losartan (COZAAR) 25 MG tablet Take 12.5 mg by mouth daily. 01/26/18   [provider]  pantoprazole (PROTONIX) 40 MG tablet Take 40 mg by mouth daily. 01/26/18   [provider]  posaconazole (NOXAFIL) 100 MG TBEC delayed-release tablet Take 300 mg by mouth every 6 (six) hours as needed. 01/26/18   [provider]  potassium chloride SA (K-DUR,KLOR-CON) 20 MEQ tablet Take 1 tablet (20 mEq total) by mouth daily. 02/04/18   Earlie Server, MD  prochlorperazine (COMPAZINE) 5 MG tablet Take 5 mg by mouth every 8 (eight) hours as needed for nausea or vomiting.    [provider]  traZODone (DESYREL) 50 MG tablet Take 50 mg by mouth daily as needed.    [provider]  valACYclovir (VALTREX) 500 MG tablet Take 500 mg by mouth daily. 01/26/18   [provider]  venlafaxine (EFFEXOR) 100 MG tablet Take 300 mg by mouth daily. 300 MG     [provider]      PHYSICAL EXAMINATION:   VITAL SIGNS: Blood pressure (!) 132/96, pulse (!) 106, temperature (!) 102.9 F (39.4 C), temperature source Rectal, resp. rate (!) 25, height '5\' 4"'$  (1.626 m), weight 99.3 kg, SpO2 98 %.  GENERAL:  48 y.o.-year-old patient lying in the bed with no acute distress.  Obese, nontoxic-appearing EYES: Pupils equal, round, reactive to light and accommodation. No scleral icterus. Extraocular muscles intact.  HEENT: Head atraumatic, normocephalic. Oropharynx and nasopharynx clear.  NECK:  Supple, no jugular venous distention. No thyroid enlargement, no tenderness.  LUNGS: Normal breath sounds bilaterally, no wheezing, rales,rhonchi or crepitation. No use of accessory muscles of respiration.  CARDIOVASCULAR: S1, S2 normal. No murmurs, rubs, or gallops.  ABDOMEN: Soft, nontender, nondistended. Bowel sounds present. No organomegaly or mass.  EXTREMITIES: No pedal  edema, cyanosis, or clubbing.  NEUROLOGIC: Cranial nerves II through XII are intact. Muscle strength 5/5 in all extremities. Sensation intact. Gait not checked.  PSYCHIATRIC: The patient is alert and oriented x 3.  SKIN: No obvious rash, lesion, or ulcer.   LABORATORY PANEL:   CBC Recent Labs  Lab 04/04/18 0839 04/09/18 1153  WBC 0.9* 0.9*  HGB 7.9* 7.2*  HCT 22.7* 21.0*  PLT 17* 14*  MCV 81.7 82.7  MCH 28.4 28.3  MCHC 34.8 34.3  RDW 12.1 11.9  LYMPHSABS 0.5* 0.3*  MONOABS 0.4 0.6  EOSABS 0.0 0.0  BASOSABS 0.0 0.0   ------------------------------------------------------------------------------------------------------------------  Chemistries  Recent Labs  Lab 04/04/18 0839 04/09/18 1153  NA 137 133*  K 3.4* 3.6  CL 106 99  CO2 24 23  GLUCOSE 159* 125*  BUN 14 12  CREATININE 1.16* 1.12*  CALCIUM 8.8* 8.9  AST 20 24  ALT 19 25  ALKPHOS 76 77  BILITOT 0.8 1.1   ------------------------------------------------------------------------------------------------------------------ estimated creatinine clearance is 71.1 mL/min (A) (by C-G formula based on SCr of 1.12 mg/dL (H)). ------------------------------------------------------------------------------------------------------------------ No results for input(s): TSH, T4TOTAL, T3FREE, THYROIDAB in the last 72 hours.  Invalid input(s):  FREET3   Coagulation profile No results for input(s): INR, PROTIME in the last 168 hours. ------------------------------------------------------------------------------------------------------------------- No results for input(s): DDIMER in the last 72 hours. -------------------------------------------------------------------------------------------------------------------  Cardiac Enzymes No results for input(s): CKMB, TROPONINI, MYOGLOBIN in the last 168 hours.  Invalid input(s):  CK ------------------------------------------------------------------------------------------------------------------ Invalid input(s): POCBNP  ---------------------------------------------------------------------------------------------------------------  Urinalysis    Component Value Date/Time   COLORURINE YELLOW 04/09/2018 [place] 04/09/2018 1154   LABSPEC 1.015 04/09/2018 1154   PHURINE 5.5 04/09/2018 1154   GLUCOSEU NEGATIVE 04/09/2018 1154   HGBUR MODERATE (A) 04/09/2018 1154   BILIRUBINUR NEGATIVE 04/09/2018 1154   KETONESUR NEGATIVE 04/09/2018 1154   PROTEINUR TRACE (A) 04/09/2018 1154   NITRITE NEGATIVE 04/09/2018 1154   LEUKOCYTESUR NEGATIVE 04/09/2018 1154     RADIOLOGY: Dg Chest Port 1 View  Result Date: 04/09/2018 CLINICAL DATA:  Fever, cough EXAM: PORTABLE CHEST 1 VIEW COMPARISON:  01/31/2018 FINDINGS: The heart size and mediastinal contours are within normal limits. Both lungs are clear. The visualized skeletal structures are unremarkable. Right-sided PICC line with the tip projecting over the cavoatrial junction. IMPRESSION: No active disease. Electronically Signed   By: Kathreen Devoid   On: 04/09/2018 12:32    EKG: Orders placed or performed during the hospital encounter of 04/09/18  . ED EKG 12-Lead  . ED EKG 12-Lead    IMPRESSION AND PLAN: *Acute sepsis secondary to neutropenic fever Admit to regular nursing for bed, neutropenic precautions, sepsis protocol, continue empiric vancomycin/cefepime/Flagyl, follow-up on cultures, Tylenol as needed  *Acute neutropenic fever Secondary to leukemia on chronic chemotherapy-last treatment was 2 weeks ago Plan of care as stated above  *Chronic leukemia on chemotherapy with chronic pancytopenia No evidence of bleeding Consult Dr. U/oncology for continuity of care, CBC daily, anemia work-up-treat as indicated, transfuse as needed  *Chronic GERD PPI daily  *Chronic depression Stable Continue  home psychotropic regiment  *Chronic hypertension Stable Continue Coreg, losartan  DVT prophylaxis with SCDs given severe pancytopenia   All the records are reviewed and case discussed with ED provider. Management plans discussed with the patient, family and they are in agreement.  CODE STATUS:full Code Status History    Date Active Date Inactive Code Status Order ID Comments User Context   02/02/2018 1610 02/03/2018 1602 Full Code [id number]  Demetrios Loll, MD Inpatient   12/13/2017 2243 12/15/2017 1611 DNR [id number]  Demetrios Loll, MD Inpatient       TOTAL TIME TAKING CARE OF THIS PATIENT: 40 minutes.    Avel Peace Salary M.D on 04/09/2018   Between 7am to 6pm - Pager - [phone]  After 6pm go to [email or web address] - password [email or web address] [facility]  Office  [phone]  CC: Primary care physician; Maryland Pink, MD   Note: This dictation was prepared with Dragon dictation along with smaller phrase technology. Any transcriptional errors that result from this process are unintentional.

## 2018-04-09 NOTE — ED Notes (Signed)
Pt state took 2 500mg  tylenol tablets at 0700, and 2 more tablets at 0900. Totaling 2g tylenol

## 2018-04-09 NOTE — ED Notes (Signed)
ED TO INPATIENT HANDOFF REPORT  ED Nurse Name and Phone #: Beckham Buxbaum, [phone]  S Name/Age/Gender Rhonda Kane 48 y.o. female Room/Bed: ED24A/ED24A  Code Status   Code Status: Prior  Home/SNF/Other Home {Patient oriented x 4 Is this baseline? Yes   Triage Complete: Triage complete  Chief Complaint fever ems  Triage Note Patient arrived by EMS from home for fever and cough. Fever started yesterday after going to [facility] and having bone biospy done. Also received platelets. Reports highest fever at home 102.0. c/o headache. Last took tylenol at 0700 today   Allergies No Known Allergies  Level of Care/Admitting Diagnosis ED Disposition    ED Disposition Condition [place]: [place] [100120]  Level of Care: Med-Surg [16]  Diagnosis: Neutropenic fever [facility]) [[id number]]  Admitting Physician: Gorden Harms [[id number]]  Attending Physician: Gorden Harms [[id number]]  Estimated length of stay: past midnight tomorrow  Certification:: I certify this patient will need inpatient services for at least 2 midnights  PT Class (Do Not Modify): Inpatient [101]  PT Acc Code (Do Not Modify): Private [1]       B Medical/Surgery History Past Medical History:  Diagnosis Date  . Breast mass, right 2015  . Depression   . Dysmenorrhea   . GERD (gastroesophageal reflux disease)   . Headache    MIGRAINES  . Insomnia   . Insulin resistance   . Irregular menses   . Menometrorrhagia   . Migraine with aura   . PMS (premenstrual syndrome)   . PONV (postoperative nausea and vomiting)    Past Surgical History:  Procedure Laterality Date  . ANKLE ARTHROSCOPY Right 05/07/2015   Procedure: ANKLE ARTHROSCOPY  / WITH TENOLYSIS;  Surgeon: [name], DPM;  Location: [facility] ORS;  Service: Podiatry;  Laterality: Right;  right ankle arthroscopy with debridement, osteochondral repair, tenolysis of posterior tibial tendon  . BREAST FIBROADENOMA SURGERY  Right 03/10/2013  . CARPAL TUNNEL RELEASE Left 09/11/2014   Procedure: CARPAL TUNNEL RELEASE;  Surgeon: Christophe Louis, MD;  Location: [facility] ORS;  Service: Orthopedics;  Laterality: Left;  . CHOLECYSTECTOMY       A IV Location/Drains/Wounds Patient Lines/Drains/Airways Status   Active Line/Drains/Airways    Name:   Placement date:   Placement time:   Site:   Days:   Peripheral IV 04/09/18 Left Hand   04/09/18    1211    Hand   less than 1   Peripheral IV 04/09/18 Left Antecubital   04/09/18    1218    Antecubital   less than 1   PICC Single Lumen 29/56/21 PICC Right Basilic 40 cm 1 cm   30/86/57    8469    Basilic   53   Incision (Closed) 09/11/14 Hand Left   09/11/14    0847     1306   Incision (Closed) 05/07/15 Ankle   05/07/15    0903     1068          Intake/Output Last 24 hours  Intake/Output Summary (Last 24 hours) at 04/09/2018 1423 Last data filed at 04/09/2018 1422 Gross per 24 hour  Intake 301.08 ml  Output -  Net 301.08 ml    Labs/Imaging Results for orders placed or performed during the hospital encounter of 04/09/18 (from the past 48 hour(s))  Lactic acid, plasma     Status: Abnormal   Collection Time: 04/09/18 11:53 AM  Result Value Ref Range   Lactic Acid,  Venous 2.7 (HH) 0.5 - 1.9 mmol/L    Comment: CRITICAL RESULT CALLED TO, READ BACK BY AND VERIFIED WITH [name]@1310  ON 04/09/18 BY HKP Performed at [facility], [address] [place], [place] [address]   Comprehensive metabolic panel     Status: Abnormal   Collection Time: 04/09/18 11:53 AM  Result Value Ref Range   Sodium 133 (L) 135 - 145 mmol/L   Potassium 3.6 3.5 - 5.1 mmol/L   Chloride 99 98 - 111 mmol/L   CO2 23 22 - 32 mmol/L   Glucose, Bld 125 (H) 70 - 99 mg/dL   BUN 12 6 - 20 mg/dL   Creatinine, Ser 1.12 (H) 0.44 - 1.00 mg/dL   Calcium 8.9 8.9 - 10.3 mg/dL   Total Protein 8.2 (H) 6.5 - 8.1 g/dL   Albumin 4.1 3.5 - 5.0 g/dL   AST 24 15 - 41 U/L   ALT 25 0 - 44 U/L    Alkaline Phosphatase 77 38 - 126 U/L   Total Bilirubin 1.1 0.3 - 1.2 mg/dL   GFR calc non Af Amer 58 (L) >60 mL/min   GFR calc Af Amer >60 >60 mL/min   Anion gap 11 5 - 15    Comment: Performed at [facility], [address] [place], [place] [address]  CBC WITH DIFFERENTIAL     Status: Abnormal   Collection Time: 04/09/18 11:53 AM  Result Value Ref Range   WBC 0.9 (LL) 4.0 - 10.5 K/uL    Comment: This critical result has verified and been called to [name] by [name] on 03 24 2020 at 1244, and has been read back.    RBC 2.54 (L) 3.87 - 5.11 MIL/uL   Hemoglobin 7.2 (L) 12.0 - 15.0 g/dL   HCT 21.0 (L) 36.0 - 46.0 %   MCV 82.7 80.0 - 100.0 fL   MCH 28.3 26.0 - 34.0 pg   MCHC 34.3 30.0 - 36.0 g/dL   RDW 11.9 11.5 - 15.5 %   Platelets 14 (LL) 150 - 400 K/uL    Comment: Immature Platelet Fraction may be clinically indicated, consider ordering this additional test [id number] THIS CRITICAL RESULT HAS VERIFIED AND BEEN CALLED TO [name] BY [name] ON 03 24 2020 AT 1244, AND HAS BEEN READ BACK.     nRBC 0.0 0.0 - 0.2 %   Neutrophils Relative % 1 %   Neutro Abs 0.0 (L) 1.7 - 7.7 K/uL   Lymphocytes Relative 31 %   Lymphs Abs 0.3 (L) 0.7 - 4.0 K/uL   Monocytes Relative 68 %   Monocytes Absolute 0.6 0.1 - 1.0 K/uL   Eosinophils Relative 0 %   Eosinophils Absolute 0.0 0.0 - 0.5 K/uL   Basophils Relative 0 %   Basophils Absolute 0.0 0.0 - 0.1 K/uL   WBC Morphology TOO FEW TO COUNT    RBC Morphology MORPHOLOGY UNREMARKABLE    Smear Review PLATELETS APPEAR DECREASED    Immature Granulocytes 0 %   Abs Immature Granulocytes 0.00 0.00 - 0.07 K/uL    Comment: Performed at [facility], [address] [place], [place] [address]  Blood gas, venous ([facility], [facility], [facility])     Status: None   Collection Time: 04/09/18 11:53 AM  Result Value Ref Range   pH, Ven 7.32 7.250 - 7.430   pCO2, Ven 47 44.0 - 60.0 mmHg   Patient temperature 37.0    Collection site  VENOUS  Sample type VENOUS     Comment: Performed at [facility], [address] [place], [place] [address]  Urinalysis, Complete w Microscopic     Status: Abnormal   Collection Time: 04/09/18 11:54 AM  Result Value Ref Range   Color, Urine YELLOW YELLOW   APPearance CLEAR CLEAR   Specific Gravity, Urine 1.015 1.005 - 1.030   pH 5.5 5.0 - 8.0   Glucose, UA NEGATIVE NEGATIVE mg/dL   Hgb urine dipstick MODERATE (A) NEGATIVE   Bilirubin Urine NEGATIVE NEGATIVE   Ketones, ur NEGATIVE NEGATIVE mg/dL   Protein, ur TRACE (A) NEGATIVE mg/dL   Nitrite NEGATIVE NEGATIVE   Leukocytes,Ua NEGATIVE NEGATIVE   Squamous Epithelial / LPF 11-20 0 - 5   WBC, UA 0-5 0 - 5 WBC/hpf   RBC / HPF 6-10 0 - 5 RBC/hpf   Bacteria, UA NONE SEEN NONE SEEN    Comment: Performed at [facility], [address] [place], [place] [address]  Influenza panel by PCR (type A & B)     Status: None   Collection Time: 04/09/18 11:58 AM  Result Value Ref Range   Influenza A By PCR NEGATIVE NEGATIVE   Influenza B By PCR NEGATIVE NEGATIVE    Comment: (NOTE) The Xpert Xpress Flu assay is intended as an aid in the diagnosis of  influenza and should not be used as a sole basis for treatment.  This  assay is FDA approved for nasopharyngeal swab specimens only. Nasal  washings and aspirates are unacceptable for Xpert Xpress Flu testing. Performed at [facility], [address] [place], [place] [address]    Dg Chest [place] 1 View  Result Date: 04/09/2018 CLINICAL DATA:  Fever, cough EXAM: PORTABLE CHEST 1 VIEW COMPARISON:  01/31/2018 FINDINGS: The heart size and mediastinal contours are within normal limits. Both lungs are clear. The visualized skeletal structures are unremarkable. Right-sided PICC line with the tip projecting over the cavoatrial junction. IMPRESSION: No active disease. Electronically Signed   By: Kathreen Devoid   On: 04/09/2018 12:32    Pending Labs Unresulted Labs (From  admission, onward)    Start     Ordered   04/09/18 1151  Lactic acid, plasma  STAT Now then every 3 hours,   STAT     04/09/18 1150   04/09/18 1151  Blood Culture (routine x 2)  BLOOD CULTURE X 2,   STAT     04/09/18 1150   04/09/18 1151  Urine culture  ONCE - STAT,   STAT     04/09/18 1150   Signed and Held  [employer] morning,   R     Signed and Held   Signed and [employer], blood  Tomorrow morning,   R     Signed and Held   Signed and Held  Procalcitonin  Tomorrow morning,   R     Signed and Held   Signed and Held  TSH  Once,   R     Signed and Held   Signed and Held  CBC  Tomorrow morning,   R     Signed and Held   Signed and Held  Culture, blood (x 2)  BLOOD CULTURE X 2,   STAT    Comments:  INITIATE ANTIBIOTICS WITHIN 1 HOUR AFTER BLOOD CULTURES DRAWN.  If unable to obtain blood cultures, call MD immediately regarding antibiotic instructions.    Signed and Held   Signed and Held  CBC  with Differential  ONCE - STAT,   R     Signed and Held   Signed and Held  Comprehensive metabolic panel  ONCE - STAT,   R     Signed and Held   Signed and Held  Lactic acid, plasma  STAT Now then every 3 hours,   STAT     Signed and Held   Signed and Held  Procalcitonin  ONCE - STAT,   R     Signed and Held   Signed and Held  Protime-INR  ONCE - STAT,   R     Signed and Held   Signed and Held  APTT  ONCE - STAT,   R     Signed and Held          Vitals/Pain Today's Vitals   04/09/18 1230 04/09/18 1242 04/09/18 1401 04/09/18 1403  BP:   110/63   Pulse:   99 94  Resp:   (!) 23 17  Temp: (!) 103 F (39.4 C) (!) 102.9 F (39.4 C)    TempSrc: Axillary Rectal    SpO2:   94% 95%  Weight:      Height:      PainSc:  8       Isolation Precautions Droplet precaution  Medications Medications  ceFEPIme (MAXIPIME) 2 g in sodium chloride 0.9 % 100 mL IVPB (has no administration in time range)  vancomycin (VANCOCIN) 1,250 mg in sodium chloride 0.9 % 250 mL IVPB (has no  administration in time range)  ibuprofen (ADVIL,MOTRIN) tablet 800 mg (800 mg Oral Given 04/09/18 1240)  vancomycin (VANCOCIN) IVPB 1000 mg/200 mL premix ( Intravenous Stopped 04/09/18 1416)    Mobility Low fall risk   Focused Assessments

## 2018-04-09 NOTE — Consult Note (Addendum)
Pharmacy Antibiotic Note  Rhonda Kane is a 47 y.o. female admitted on 04/09/2018 with sepsis/neutropenic fever    Pharmacy has been consulted for Vancomycin/Cefepime dosing.  Plan: Vancomycin 1g load received in the ED + 1g in room (for a total of 2g loading dose), will follow with  Vancomycin 1250 mg IV Q 24 hrs. Goal AUC 400-550. Expected AUC: 514 SCr used: 1.12  Cefepime 2g q 8h   Height: 5\' 4"  (162.6 cm) Weight: 219 lb (99.3 kg) IBW/kg (Calculated) : 54.7  Temp (24hrs), Avg:101.1 F (38.4 C), Min:99 F (37.2 C), Max:103 F (39.4 C)  Recent Labs  Lab 04/04/18 0839 04/09/18 1153  WBC 0.9* 0.9*  CREATININE 1.16* 1.12*  LATICACIDVEN  --  2.7*    Estimated Creatinine Clearance: 71.1 mL/min (A) (by C-G formula based on SCr of 1.12 mg/dL (H)).    No Known Allergies  Antimicrobials this admission: Cefepime 3/24 >>  Vancomycin 3/24 >>   Dose adjustments this admission: None  Microbiology results: 3/24 BCx: pending 3/24 UCx: pending    Thank you for allowing pharmacy to be a part of this patient's care.  Lu Duffel, PharmD, BCPS Clinical Pharmacist 04/09/2018 2:12 PM

## 2018-04-09 NOTE — ED Notes (Signed)
X-ray at bedside

## 2018-04-09 NOTE — Progress Notes (Signed)
MEWS of 4.  Requested SWOT nurse to assist with pt care.  Pt needs type and screen.  Gave motrin for temp.  Pt needs transfusion. Dorna Bloom RN

## 2018-04-09 NOTE — Telephone Encounter (Signed)
Patient went to [facility] yesterday for Bone Marrow Biopsy and while there she got 2 units of platelets. This morning she is running fever 102 with no other symptoms. Note from [facility] reports that she was having joint aches yesterday. Please advise

## 2018-04-09 NOTE — ED Provider Notes (Signed)
[facility] Emergency Department Provider Note       Time seen: ----------------------------------------- 11:42 AM on 04/09/2018 -----------------------------------------  I have reviewed the triage vital signs and the nursing notes.  HISTORY   Chief Complaint Fever   HPI Rhonda Kane is a 48 y.o. female with a history of depression, dysmenorrhea, migraines who presents to the ED for fever and cough.  Fever started yesterday after going to [facility] and have a bone marrow biopsy done.  She also received platelets.  Highest fever at home was 102.  She denies any pain.  Has had only slight cough.  Past Medical History:  Diagnosis Date  . Breast mass, right 2015  . Depression   . Dysmenorrhea   . GERD (gastroesophageal reflux disease)   . Headache    MIGRAINES  . Insomnia   . Insulin resistance   . Irregular menses   . Menometrorrhagia   . Migraine with aura   . PMS (premenstrual syndrome)   . PONV (postoperative nausea and vomiting)     Patient Active Problem List   Diagnosis Date Noted  . Hypokalemia 03/21/2018  . Encounter for antineoplastic chemotherapy 03/18/2018  . 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 Anemia due to antineoplastic chemotherapy 02/14/2018  . Goals of care, counseling/discussion 02/09/2018  . Pancytopenia ([place]) 02/02/2018  . 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 Anemia   . Thrombocytopenia ([place])   . Neutropenia ([place])   . Symptomatic anemia 12/13/2017    Past Surgical History:  Procedure Laterality Date  . ANKLE ARTHROSCOPY Right 05/07/2015   Procedure: ANKLE ARTHROSCOPY  / WITH TENOLYSIS;  Surgeon: [name], DPM;  Location: [facility] ORS;  Service: Podiatry;  Laterality: Right;  right ankle arthroscopy with debridement, osteochondral repair, tenolysis of posterior tibial tendon  . BREAST FIBROADENOMA SURGERY Right 03/10/2013  . CARPAL TUNNEL RELEASE Left 09/11/2014   Procedure:  CARPAL TUNNEL RELEASE;  Surgeon: Christophe Louis, MD;  Location: [facility] ORS;  Service: Orthopedics;  Laterality: Left;  . CHOLECYSTECTOMY      Allergies Patient has no known allergies.  Social History Social History   Tobacco Use  . Smoking status: Never Smoker  . Smokeless tobacco: Never Used  Substance Use Topics  . Alcohol use: No  . Drug use: No   Review of Systems Constitutional: Positive for fever Cardiovascular: Negative for chest pain. Respiratory: Negative for shortness of breath.  Positive for cough Gastrointestinal: Negative for abdominal pain, vomiting and diarrhea. Genitourinary: Negative for dysuria. Musculoskeletal: Negative for back pain. Skin: Negative for rash. Neurological: Negative for headaches, focal weakness or numbness.  All systems negative/normal/unremarkable except as stated in the HPI  ____________________________________________   PHYSICAL EXAM:  VITAL SIGNS: ED Triage Vitals  Enc Vitals Group     BP      Pulse      Resp      Temp      Temp src      SpO2      Weight      Height      Head Circumference      Peak Flow      Pain Score      Pain Loc      Pain Edu?      Excl. in [place]?    Constitutional: Alert and oriented. Well appearing and in no distress. Eyes: Conjunctivae are normal. Normal extraocular movements. ENT      Head: Normocephalic and atraumatic.  Nose: No congestion/rhinnorhea.      Mouth/Throat: Mucous membranes are moist.      Neck: No stridor. Cardiovascular: Normal rate, regular rhythm. No murmurs, rubs, or gallops. Respiratory: Normal respiratory effort without tachypnea nor retractions. Breath sounds are clear and equal bilaterally. No wheezes/rales/rhonchi. Gastrointestinal: Soft and nontender. Normal bowel sounds Musculoskeletal: Tenderness over the left sacrum Neurologic:  Normal speech and language. No gross focal neurologic deficits are appreciated.  Skin:  Skin is warm, dry with bone marrow biopsy  site clean dry and intact.  No unusual bruising or tenderness Psychiatric: Mood and affect are normal. Speech and behavior are normal.  ____________________________________________  ED COURSE:  As part of my medical decision making, I reviewed the following data within the [place] History obtained from family if available, nursing notes, old chart and ekg, as well as notes from prior ED visits. Patient presented for fever of unknown origin, we will assess with labs and imaging as indicated at this time.   Procedures ____________________________________________   LABS (pertinent positives/negatives)  Labs Reviewed  LACTIC ACID, PLASMA - Abnormal; Notable for the following components:      Result Value   Lactic Acid, Venous 2.7 (*)    All other components within normal limits  COMPREHENSIVE METABOLIC PANEL - Abnormal; Notable for the following components:   Sodium 133 (*)    Glucose, Bld 125 (*)    Creatinine, Ser 1.12 (*)    Total Protein 8.2 (*)    GFR calc non Af Amer 58 (*)    All other components within normal limits  CBC WITH DIFFERENTIAL/PLATELET - Abnormal; Notable for the following components:   WBC 0.9 (*)    RBC 2.54 (*)    Hemoglobin 7.2 (*)    HCT 21.0 (*)    Platelets 14 (*)    All other components within normal limits  URINALYSIS, COMPLETE (UACMP) WITH MICROSCOPIC - Abnormal; Notable for the following components:   Hgb urine dipstick MODERATE (*)    Protein, ur TRACE (*)    All other components within normal limits  CULTURE, BLOOD (ROUTINE X 2)  CULTURE, BLOOD (ROUTINE X 2)  URINE CULTURE  BLOOD GAS, VENOUS  INFLUENZA PANEL BY PCR (TYPE A & B)  LACTIC ACID, PLASMA   CRITICAL CARE Performed by: Laurence Aly   Total critical care time: 30 minutes  Critical care time was exclusive of separately billable procedures and treating other patients.  Critical care was necessary to treat or prevent imminent or life-threatening  deterioration.  Critical care was time spent personally by me on the following activities: development of treatment plan with patient and/or surrogate as well as nursing, discussions with consultants, evaluation of patient's response to treatment, examination of patient, obtaining history from patient or surrogate, ordering and performing treatments and interventions, ordering and review of laboratory studies, ordering and review of radiographic studies, pulse oximetry and re-evaluation of patient's condition.  RADIOLOGY Images were viewed by me  Chest x-ray IMPRESSION: No active disease. ____________________________________________   DIFFERENTIAL DIAGNOSIS   Influenza, pneumonia, sepsis, leukemia  FINAL ASSESSMENT AND PLAN  Neutropenic fever   Plan: The patient had presented for unexplained fever. 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 Patient's imaging did not reveal any acute process.  She is present to be septic at this point, we have started her on vancomycin and cefepime.  I will discuss with the hospitalist for admission.   Laurence Aly, MD  Note: This note was generated in part or whole with voice recognition software. Voice recognition is usually quite accurate but there are transcription errors that can and very often do occur. I apologize for any typographical errors that were not detected and corrected.     Earleen Newport, MD 04/09/18 1318
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
## 2018-04-09 NOTE — ED Notes (Signed)
Date and time results received: 04/09/18   Test: WBC, platlet Critical Value: .9, 14k  Name of Provider Notified: williams   Orders Received? Or Actions Taken?: MD notifed

## 2018-04-10 ENCOUNTER — Inpatient Hospital Stay: Payer: 59

## 2018-04-10 LAB — CBC
HCT: 18.6 % — ABNORMAL LOW (ref 36.0–46.0)
Hemoglobin: 6.3 g/dL — ABNORMAL LOW (ref 12.0–15.0)
MCH: 28.5 pg (ref 26.0–34.0)
MCHC: 33.9 g/dL (ref 30.0–36.0)
MCV: 84.2 fL (ref 80.0–100.0)
Platelets: 18 10*3/uL — CL (ref 150–400)
RBC: 2.21 MIL/uL — ABNORMAL LOW (ref 3.87–5.11)
RDW: 12.1 % (ref 11.5–15.5)
WBC: 0.6 10*3/uL — CL (ref 4.0–10.5)
nRBC: 0 % (ref 0.0–0.2)

## 2018-04-10 LAB — PROCALCITONIN: Procalcitonin: 0.62 ng/mL

## 2018-04-10 LAB — PREPARE PLATELET PHERESIS: Unit division: 0

## 2018-04-10 LAB — MRSA PCR SCREENING: MRSA by PCR: NEGATIVE

## 2018-04-10 LAB — BPAM PLATELET PHERESIS
Blood Product Expiration Date: 202003262359
ISSUE DATE / TIME: 202003242121
UNIT TYPE AND RH: 5100

## 2018-04-10 LAB — URINE CULTURE: Culture: 80000 — AB

## 2018-04-10 LAB — PREPARE RBC (CROSSMATCH)

## 2018-04-10 LAB — PROTIME-INR
INR: 1.3 — ABNORMAL HIGH (ref 0.8–1.2)
Prothrombin Time: 16 seconds — ABNORMAL HIGH (ref 11.4–15.2)

## 2018-04-10 LAB — CORTISOL-AM, BLOOD: CORTISOL - AM: 10.5 ug/dL (ref 6.7–22.6)

## 2018-04-10 MED ORDER — LOSARTAN POTASSIUM 25 MG PO TABS
25.0000 mg | ORAL_TABLET | Freq: Every day | ORAL | Status: DC
  Administered 2018-04-11 – 2018-04-14 (×4): 25 mg via ORAL
  Filled 2018-04-10 (×4): qty 1

## 2018-04-10 MED ORDER — FUROSEMIDE 10 MG/ML IJ SOLN
20.0000 mg | Freq: Once | INTRAMUSCULAR | Status: AC
  Administered 2018-04-10: 20 mg via INTRAVENOUS
  Filled 2018-04-10: qty 2

## 2018-04-10 MED ORDER — SODIUM CHLORIDE 0.9% IV SOLUTION
Freq: Once | INTRAVENOUS | Status: AC
  Administered 2018-04-10: 15:00:00 via INTRAVENOUS

## 2018-04-10 MED ORDER — FUROSEMIDE 10 MG/ML IJ SOLN
INTRAMUSCULAR | Status: AC
  Filled 2018-04-10: qty 2

## 2018-04-10 MED ORDER — POTASSIUM CHLORIDE CRYS ER 20 MEQ PO TBCR
60.0000 meq | EXTENDED_RELEASE_TABLET | Freq: Once | ORAL | Status: AC
  Administered 2018-04-10: 15:00:00 60 meq via ORAL
  Filled 2018-04-10: qty 3

## 2018-04-10 MED ORDER — VALACYCLOVIR HCL 500 MG PO TABS
500.0000 mg | ORAL_TABLET | Freq: Two times a day (BID) | ORAL | Status: DC
  Administered 2018-04-10 – 2018-04-14 (×8): 500 mg via ORAL
  Filled 2018-04-10 (×9): qty 1

## 2018-04-10 NOTE — Progress Notes (Signed)
[place] [facility] [place] NAME: Rhonda Kane    MR#:  [id number]  DATE OF BIRTH:  1970/05/20  SUBJECTIVE:   Pt. Admitted to hospital due to neutropenic fevers.  He is better this morning.  Fevers have resolved.  Also noted to be anemic and thrombocytopenic.  Hemoglobin still remains low platelet count is stable.  No acute bleeding overnight.  REVIEW OF SYSTEMS:    Review of Systems  Constitutional: Negative for fever and weight loss.  HENT: Negative for congestion, nosebleeds and tinnitus.   Eyes: Negative for blurred vision, double vision and redness.  Respiratory: Negative for cough, hemoptysis and shortness of breath.   Cardiovascular: Negative for chest pain, orthopnea, leg swelling and PND.  Gastrointestinal: Negative for abdominal pain, diarrhea, melena, nausea and vomiting.  Genitourinary: Negative for dysuria, hematuria and urgency.  Musculoskeletal: Negative for falls and joint pain.  Neurological: Negative for dizziness, tingling, sensory change, focal weakness, seizures, weakness and headaches.  Endo/Heme/Allergies: Negative for polydipsia. Does not bruise/bleed easily.  Psychiatric/Behavioral: Negative for depression and memory loss. The patient is not nervous/anxious.     Nutrition: Heart Healthy Tolerating Diet: Yes Tolerating PT: Ambulatory     DRUG ALLERGIES:  No Known Allergies  VITALS:  Blood pressure (!) 185/93, pulse (!) 105, temperature 98.2 F (36.8 C), temperature source Oral, resp. rate 20, height 5\' 4"  (1.626 m), weight 99.3 kg, SpO2 94 %.  PHYSICAL EXAMINATION:   Physical Exam  GENERAL:  48 y.o.-year-old patient lying in bed in no acute distress.  EYES: Pupils equal, round, reactive to light and accommodation. No scleral icterus. Extraocular muscles intact.  HEENT: Head atraumatic, normocephalic. Oropharynx and nasopharynx clear.  NECK:  Supple, no jugular venous distention. No thyroid enlargement, no  tenderness.  LUNGS: Normal breath sounds bilaterally, no wheezing, rales, rhonchi. No use of accessory muscles of respiration.  CARDIOVASCULAR: S1, S2 normal. No murmurs, rubs, or gallops.  ABDOMEN: Soft, nontender, nondistended. Bowel sounds present. No organomegaly or mass.  EXTREMITIES: No cyanosis, clubbing or edema b/l.    NEUROLOGIC: Cranial nerves II through XII are intact. No focal Motor or sensory deficits b/l.   PSYCHIATRIC: The patient is alert and oriented x 3.  SKIN: No obvious rash, lesion, or ulcer.    LABORATORY PANEL:   CBC Recent Labs  Lab 04/10/18 0503  WBC 0.6*  HGB 6.3*  HCT 18.6*  PLT 18*   ------------------------------------------------------------------------------------------------------------------  Chemistries  Recent Labs  Lab 04/09/18 1555  NA 134*  K 3.1*  CL 102  CO2 25  GLUCOSE 118*  BUN 13  CREATININE 1.12*  CALCIUM 8.6*  AST 21  ALT 23  ALKPHOS 59  BILITOT 1.1   ------------------------------------------------------------------------------------------------------------------  Cardiac Enzymes No results for input(s): TROPONINI in the last 168 hours. ------------------------------------------------------------------------------------------------------------------  RADIOLOGY:  Dg Chest Port 1 View  Result Date: 04/10/2018 CLINICAL DATA:  Generalized chest tightness since yesterday. EXAM: PORTABLE CHEST 1 VIEW COMPARISON:  April 09, 2018 FINDINGS: The heart size is borderline. No pneumothorax. No pulmonary nodules or masses. Diffuse increased interstitial opacities. No focal infiltrate. No nodule or mass. No change in the cardiomediastinal silhouette. Stable right PICC line terminating in the central SVC. IMPRESSION: 1. Diffuse interstitial opacities may represent pulmonary edema. Atypical infection could have a similar appearance. No other interval changes. Electronically Signed   By: Dorise Bullion III M.D   On: 04/10/2018 08:32    Dg Chest Port 1 View  Result Date: 04/09/2018 CLINICAL  DATA:  Fever, cough EXAM: PORTABLE CHEST 1 VIEW COMPARISON:  01/31/2018 FINDINGS: The heart size and mediastinal contours are within normal limits. Both lungs are clear. The visualized skeletal structures are unremarkable. Right-sided PICC line with the tip projecting over the cavoatrial junction. IMPRESSION: No active disease. Electronically Signed   By: Kathreen Devoid   On: 04/09/2018 12:32     ASSESSMENT AND PLAN:   48 year old female with past medical history of AML currently ongoing chemotherapy, history of pancytopenia, depression, GERD, hypertension who presented to the hospital due to neutropenic fevers.  1.  Neutropenic fevers- patient's fevers have now improved.  Source remains unclear. - Cultures remain negative, patient is hemodynamically stable.  Patient's chest x-ray was negative on admission, urinalysis is negative. -We will get MRSA PCR and if negative will DC vancomycin.  Continue cefepime and Flagyl for now.  2.  Pancytopenia-secondary to underlying AML with ongoing chemotherapy. - Patient's hemoglobin still remains low at 6.3 and will transfuse 1 unit of irradiated blood.  Hold off on platelet transfusion for now.  No acute bleeding.  Discussed with oncology with Dr. Tasia Catchings  3.  Hypokalemia-replace accordingly, repeat level in the morning.  4.  GERD-continue Protonix.  5.  Essential hypertension-continue losartan, carvedilol  6.  Anxiety/depression-continue BuSpar, Effexor.     All the records are reviewed and case discussed with Care Management/Social Worker. Management plans discussed with the patient, family and they are in agreement.  CODE STATUS: Full code  DVT Prophylaxis: Ted's & SCD's  TOTAL TIME TAKING CARE OF THIS PATIENT: 30 minutes.   POSSIBLE D/C IN 1-2 DAYS, DEPENDING ON CLINICAL CONDITION.   Henreitta Leber M.D on 04/10/2018 at 11:59 AM  Between 7am to 6pm - Pager - [phone]  After 6pm  go to [email or web address] - Proofreader  [facility]  Office  [phone]  CC: Primary care physician; [place] Pink, MD

## 2018-04-10 NOTE — Progress Notes (Signed)
MRSA PCR sent 

## 2018-04-10 NOTE — Progress Notes (Addendum)
Pt advised this nurse that she feels tightening in her chest, "last time I felt like this I was overloaded with fluid and woke up in ICU 3 days later."  VS 98.2 oral, 185/93, 105, 20 SPO2 86.  Pt placed on 2L O2, and expressed relief in her chest.  Pt C/o nausea, medicated with IV Zofran. Unable to give prn dose of PO lasix d/t nausea. Call placed to notify MD.  Awaiting response.  [id number]

## 2018-04-11 ENCOUNTER — Inpatient Hospital Stay: Payer: 59

## 2018-04-11 ENCOUNTER — Telehealth: Payer: Self-pay | Admitting: Oncology

## 2018-04-11 LAB — TYPE AND SCREEN
ABO/RH(D): O POS
ANTIBODY SCREEN: NEGATIVE
UNIT DIVISION: 0
Unit division: 0
Unit division: 0

## 2018-04-11 LAB — CBC
HCT: 22.8 % — ABNORMAL LOW (ref 36.0–46.0)
Hemoglobin: 8.1 g/dL — ABNORMAL LOW (ref 12.0–15.0)
MCH: 29.7 pg (ref 26.0–34.0)
MCHC: 35.5 g/dL (ref 30.0–36.0)
MCV: 83.5 fL (ref 80.0–100.0)
NRBC: 0 % (ref 0.0–0.2)
Platelets: 23 10*3/uL — CL (ref 150–400)
RBC: 2.73 MIL/uL — ABNORMAL LOW (ref 3.87–5.11)
RDW: 12.9 % (ref 11.5–15.5)
WBC: 1 10*3/uL — AB (ref 4.0–10.5)

## 2018-04-11 LAB — BPAM RBC
Blood Product Expiration Date: 202004092359
Blood Product Expiration Date: 202004092359
Blood Product Expiration Date: 202004112359
ISSUE DATE / TIME: 202003250145
ISSUE DATE / TIME: 202003250514
ISSUE DATE / TIME: 202003251236
UNIT TYPE AND RH: 5100
Unit Type and Rh: 5100
Unit Type and Rh: 5100

## 2018-04-11 MED ORDER — ALPRAZOLAM 0.25 MG PO TABS
0.2500 mg | ORAL_TABLET | Freq: Once | ORAL | Status: AC
  Administered 2018-04-11: 18:00:00 0.25 mg via ORAL
  Filled 2018-04-11: qty 1

## 2018-04-11 MED ORDER — SODIUM CHLORIDE 0.9 % IV SOLN
INTRAVENOUS | Status: DC | PRN
  Administered 2018-04-11: 23:00:00 500 mL via INTRAVENOUS
  Administered 2018-04-13: 1000 mL via INTRAVENOUS
  Administered 2018-04-13: 250 mL via INTRAVENOUS
  Administered 2018-04-13: 1000 mL via INTRAVENOUS

## 2018-04-11 MED ORDER — FUROSEMIDE 10 MG/ML IJ SOLN
20.0000 mg | Freq: Once | INTRAMUSCULAR | Status: AC
  Administered 2018-04-11: 20 mg via INTRAVENOUS
  Filled 2018-04-11: qty 2

## 2018-04-11 NOTE — Progress Notes (Signed)
Discussed with [facility] transplant team Dr.Zeidner.  Recommend obtaining CT chest wo contrast given her chronic neutropenic/immunocompromised state. If no pneumonia, afebrile for 48 hours, can be discharged with oral antibiotics. I discussed with patient about the plan.  Updated Dr.Sainani.

## 2018-04-11 NOTE — Telephone Encounter (Signed)
Error

## 2018-04-11 NOTE — Progress Notes (Signed)
[place] at [place] NAME: Rhonda Kane    MR#:  [id number]  DATE OF BIRTH:  02/06/1970  SUBJECTIVE:   Patient had some low-grade fever yesterday but none this morning.  Patient's hemoglobin improved posttransfusion.  No other complaints or events overnight.  REVIEW OF SYSTEMS:    Review of Systems  Constitutional: Positive for fever. Negative for weight loss.  HENT: Negative for congestion, nosebleeds and tinnitus.   Eyes: Negative for blurred vision, double vision and redness.  Respiratory: Negative for cough, hemoptysis and shortness of breath.   Cardiovascular: Negative for chest pain, orthopnea, leg swelling and PND.  Gastrointestinal: Negative for abdominal pain, diarrhea, melena, nausea and vomiting.  Genitourinary: Negative for dysuria, hematuria and urgency.  Musculoskeletal: Negative for falls and joint pain.  Neurological: Negative for dizziness, tingling, sensory change, focal weakness, seizures, weakness and headaches.  Endo/Heme/Allergies: Negative for polydipsia. Does not bruise/bleed easily.  Psychiatric/Behavioral: Negative for depression and memory loss. The patient is not nervous/anxious.     Nutrition: Heart Healthy Tolerating Diet: Yes Tolerating PT: Ambulatory     DRUG ALLERGIES:  No Known Allergies  VITALS:  Blood pressure (!) 156/87, pulse 97, temperature 98.3 F (36.8 C), temperature source Oral, resp. rate 18, height 5\' 4"  (1.626 m), weight 99.3 kg, SpO2 91 %.  PHYSICAL EXAMINATION:   Physical Exam  GENERAL:  48 y.o.-year-old patient lying in bed in no acute distress.  EYES: Pupils equal, round, reactive to light and accommodation. No scleral icterus. Extraocular muscles intact.  HEENT: Head atraumatic, normocephalic. Oropharynx and nasopharynx clear.  NECK:  Supple, no jugular venous distention. No thyroid enlargement, no tenderness.  LUNGS: Normal breath sounds bilaterally, no wheezing, rales,  rhonchi. No use of accessory muscles of respiration.  CARDIOVASCULAR: S1, S2 normal. No murmurs, rubs, or gallops.  ABDOMEN: Soft, nontender, nondistended. Bowel sounds present. No organomegaly or mass.  EXTREMITIES: No cyanosis, clubbing or edema b/l.    NEUROLOGIC: Cranial nerves II through XII are intact. No focal Motor or sensory deficits b/l.   PSYCHIATRIC: The patient is alert and oriented x 3.  SKIN: No obvious rash, lesion, or ulcer.    LABORATORY PANEL:   CBC Recent Labs  Lab 04/11/18 0407  WBC 1.0*  HGB 8.1*  HCT 22.8*  PLT 23*   ------------------------------------------------------------------------------------------------------------------  Chemistries  Recent Labs  Lab 04/09/18 1555  NA 134*  K 3.1*  CL 102  CO2 25  GLUCOSE 118*  BUN 13  CREATININE 1.12*  CALCIUM 8.6*  AST 21  ALT 23  ALKPHOS 59  BILITOT 1.1   ------------------------------------------------------------------------------------------------------------------  Cardiac Enzymes No results for input(s): TROPONINI in the last 168 hours. ------------------------------------------------------------------------------------------------------------------  RADIOLOGY:  Ct Chest Wo Contrast  Result Date: 04/11/2018 CLINICAL DATA:  48 year old female with history of leukemia. EXAM: CT CHEST WITHOUT CONTRAST TECHNIQUE: Multidetector CT imaging of the chest was performed following the standard protocol without IV contrast. COMPARISON:  Chest CT 02/06/2011. FINDINGS: Cardiovascular: Heart size is mildly enlarged. There is no significant pericardial fluid, thickening or pericardial calcification. Right upper extremity PICC with tip terminating at the superior cavoatrial junction. Mediastinum/Nodes: No pathologically enlarged mediastinal or hilar lymph nodes. Please note that accurate exclusion of hilar adenopathy is limited on noncontrast CT scans. Esophagus is unremarkable in appearance. No axillary  lymphadenopathy. Lungs/Pleura: There is an unusual pattern of some patchy ground-glass attenuation and very mild interlobular septal thickening in the lung apices, as well as some mild peripheral predominant  interlobular septal thickening in the extreme lung bases. No confluent consolidative airspace disease. No pleural effusions. No suspicious appearing pulmonary nodules or masses are noted. Upper Abdomen: Status post cholecystectomy. Musculoskeletal: There are no aggressive appearing lytic or blastic lesions noted in the visualized portions of the skeleton. IMPRESSION: 1. Unusual appearance of the lungs. Based on the recent chest radiographs from 04/09/2018, as well as 04/10/2018, which showed significant worsening in aeration between those two examinations, but apparent improvement in aeration in the lung parenchyma on today's chest CT, the overall pattern is one of presumed pulmonary edema (now resolving), likely related to the patient's recent blood transfusions. Attention on follow-up radiographs is recommended to ensure complete resolution of these findings. Should the patient clinically deteriorate, repeat chest x-rays would be advised to exclude the possibility of an atypical viral infection. No definitive findings to suggest fungal pneumonia at this time. These results were called by telephone at the time of interpretation on 04/11/2018 at 11:17 am to Dr. Earlie Server , who verbally acknowledged these results. Electronically Signed   By: Vinnie Langton M.D.   On: 04/11/2018 11:18   Dg Chest Port 1 View  Result Date: 04/10/2018 CLINICAL DATA:  Generalized chest tightness since yesterday. EXAM: PORTABLE CHEST 1 VIEW COMPARISON:  April 09, 2018 FINDINGS: The heart size is borderline. No pneumothorax. No pulmonary nodules or masses. Diffuse increased interstitial opacities. No focal infiltrate. No nodule or mass. No change in the cardiomediastinal silhouette. Stable right PICC line terminating in the central  SVC. IMPRESSION: 1. Diffuse interstitial opacities may represent pulmonary edema. Atypical infection could have a similar appearance. No other interval changes. Electronically Signed   By: Dorise Bullion III M.D   On: 04/10/2018 08:32     ASSESSMENT AND PLAN:   48 year old female with past medical history of AML currently ongoing chemotherapy, history of pancytopenia, depression, GERD, hypertension who presented to the hospital due to neutropenic fevers.  1.  Neutropenic fevers- still had low-grade fevers yesterday.  No other acute events overnight.  Patient denies any cough, worsening shortness of breath. -Blood cultures remain negative, urine cultures growing diphtheroids which is likely contaminants.  Patient's MRSA PCR was negative therefore off vancomycin.  Continue cefepime and Flagyl empirically for now. -Discussed with oncology and he discussed with the patient's transplant physicians at [facility] who recommended a CT chest to rule out pneumonia.  Patient did undergo CT chest today which shows diffuse interstitial opacities representing more pulmonary edema.  Patient did receive some Lasix yesterday.  Clinically she is not hypoxic. -As per oncology patient needs to have been afebrile for 48 hours prior to discharge.   2.  Pancytopenia-secondary to underlying AML with ongoing chemotherapy. -Hemoglobin improved posttransfusion yesterday up to 8.1.  Platelet count up to 23.  No acute bleeding.  We will continue to follow serial counts.  3.  Hypokalemia- cont. To supplement and repeat in a.m.   4.  GERD-continue Protonix.  5.  Essential hypertension-continue losartan, carvedilol - BP stable  6.  Anxiety/depression-continue BuSpar, Effexor.  Discussed with Dr. Tasia Catchings   All the records are reviewed and case discussed with Care Management/Social Worker. Management plans discussed with the patient, family and they are in agreement.  CODE STATUS: Full code  DVT Prophylaxis: Ted's & SCD's   TOTAL TIME TAKING CARE OF THIS PATIENT: 30 minutes.   POSSIBLE D/C IN 1-2 DAYS when fever free for 48 hrs, DEPENDING ON CLINICAL CONDITION.   Henreitta Leber M.D on 04/11/2018 at 1:35  PM  Between 7am to 6pm - Pager - [phone]  After 6pm go to [email or web address] - Proofreader  [facility]  Office  [phone]  CC: Primary care physician; [place] Pink, MD

## 2018-04-12 ENCOUNTER — Other Ambulatory Visit: Payer: Self-pay

## 2018-04-12 ENCOUNTER — Telehealth: Payer: Self-pay

## 2018-04-12 DIAGNOSIS — Z95828 Presence of other vascular implants and grafts: Secondary | ICD-10-CM

## 2018-04-12 DIAGNOSIS — I427 Cardiomyopathy due to drug and external agent: Secondary | ICD-10-CM

## 2018-04-12 DIAGNOSIS — Z79899 Other long term (current) drug therapy: Secondary | ICD-10-CM

## 2018-04-12 DIAGNOSIS — Z792 Long term (current) use of antibiotics: Secondary | ICD-10-CM

## 2018-04-12 DIAGNOSIS — T451X5S Adverse effect of antineoplastic and immunosuppressive drugs, sequela: Secondary | ICD-10-CM

## 2018-04-12 LAB — BASIC METABOLIC PANEL
ANION GAP: 8 (ref 5–15)
BUN: 13 mg/dL (ref 6–20)
CALCIUM: 8.8 mg/dL — AB (ref 8.9–10.3)
CO2: 26 mmol/L (ref 22–32)
Chloride: 104 mmol/L (ref 98–111)
Creatinine, Ser: 0.92 mg/dL (ref 0.44–1.00)
GFR calc Af Amer: >60 mL/min (ref >60)
GFR calc non Af Amer: >60 mL/min (ref >60)
GLUCOSE: 103 mg/dL — AB (ref 70–99)
POTASSIUM: 3 mmol/L — AB (ref 3.5–5.1)
Sodium: 138 mmol/L (ref 135–145)

## 2018-04-12 LAB — CBC
HCT: 24 % — ABNORMAL LOW (ref 36.0–46.0)
Hemoglobin: 8.6 g/dL — ABNORMAL LOW (ref 12.0–15.0)
MCH: 29.6 pg (ref 26.0–34.0)
MCHC: 35.8 g/dL (ref 30.0–36.0)
MCV: 82.5 fL (ref 80.0–100.0)
Platelets: 29 10*3/uL — CL (ref 150–400)
RBC: 2.91 MIL/uL — ABNORMAL LOW (ref 3.87–5.11)
RDW: 12.9 % (ref 11.5–15.5)
WBC: 0.8 10*3/uL — CL (ref 4.0–10.5)
nRBC: 0 % (ref 0.0–0.2)

## 2018-04-12 LAB — MAGNESIUM: Magnesium: 1.8 mg/dL (ref 1.7–2.4)

## 2018-04-12 MED ORDER — POTASSIUM CHLORIDE CRYS ER 20 MEQ PO TBCR
60.0000 meq | EXTENDED_RELEASE_TABLET | Freq: Once | ORAL | Status: AC
  Administered 2018-04-12: 14:00:00 60 meq via ORAL
  Filled 2018-04-12: qty 3
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
## 2018-04-12 NOTE — Progress Notes (Signed)
[place] [facility] [place] NAME: Rhonda Kane    MR#:  [id number]  DATE OF BIRTH:  [birth year]  SUBJECTIVE:   Pt. Continues to have fever and had a fever of 101.  CT chest done yesterday showing fluid overload, pulm. Edema but ?? Atypical viral infection.    Clinically feels a lot better.    REVIEW OF SYSTEMS:    Review of Systems  Constitutional: Positive for fever. Negative for weight loss.  HENT: Negative for congestion, nosebleeds and tinnitus.   Eyes: Negative for blurred vision, double vision and redness.  Respiratory: Negative for cough, hemoptysis and shortness of breath.   Cardiovascular: Negative for chest pain, orthopnea, leg swelling and PND.  Gastrointestinal: Negative for abdominal pain, diarrhea, melena, nausea and vomiting.  Genitourinary: Negative for dysuria, hematuria and urgency.  Musculoskeletal: Negative for falls and joint pain.  Neurological: Negative for dizziness, tingling, sensory change, focal weakness, seizures, weakness and headaches.  Endo/Heme/Allergies: Negative for polydipsia. Does not bruise/bleed easily.  Psychiatric/Behavioral: Negative for depression and memory loss. The patient is not nervous/anxious.     Nutrition: Heart Healthy Tolerating Diet: Yes Tolerating PT: Ambulatory   DRUG ALLERGIES:  No Known Allergies  VITALS:  Blood pressure 129/82, pulse 86, temperature 98.6 F (37 C), temperature source Oral, resp. rate 18, height 5\' 4"  (1.626 m), weight 99.3 kg, SpO2 94 %.  PHYSICAL EXAMINATION:   Physical Exam  GENERAL:  48 y.o.-year-old patient lying in bed in no acute distress.  EYES: Pupils equal, round, reactive to light and accommodation. No scleral icterus. Extraocular muscles intact.  HEENT: Head atraumatic, normocephalic. Oropharynx and nasopharynx clear.  NECK:  Supple, no jugular venous distention. No thyroid enlargement, no tenderness.  LUNGS: Normal breath sounds bilaterally, no  wheezing, rales, rhonchi. No use of accessory muscles of respiration.  CARDIOVASCULAR: S1, S2 normal. No murmurs, rubs, or gallops.  ABDOMEN: Soft, nontender, nondistended. Bowel sounds present. No organomegaly or mass.  EXTREMITIES: No cyanosis, clubbing or edema b/l.    NEUROLOGIC: Cranial nerves II through XII are intact. No focal Motor or sensory deficits b/l.   PSYCHIATRIC: The patient is alert and oriented x 3.  SKIN: No obvious rash, lesion, or ulcer.    LABORATORY PANEL:   CBC Recent Labs  Lab 04/12/18 0646  WBC 0.8*  HGB 8.6*  HCT 24.0*  PLT 29*   ------------------------------------------------------------------------------------------------------------------  Chemistries  Recent Labs  Lab 04/09/18 1555 04/12/18 0646  NA 134* 138  K 3.1* 3.0*  CL 102 104  CO2 25 26  GLUCOSE 118* 103*  BUN 13 13  CREATININE 1.12* 0.92  CALCIUM 8.6* 8.8*  AST 21  --   ALT 23  --   ALKPHOS 59  --   BILITOT 1.1  --    ------------------------------------------------------------------------------------------------------------------  Cardiac Enzymes No results for input(s): TROPONINI in the last 168 hours. ------------------------------------------------------------------------------------------------------------------  RADIOLOGY:  Ct Chest Wo Contrast  Result Date: 04/11/2018 CLINICAL DATA:  48 year old female with history of leukemia. EXAM: CT CHEST WITHOUT CONTRAST TECHNIQUE: Multidetector CT imaging of the chest was performed following the standard protocol without IV contrast. COMPARISON:  Chest CT 02/06/2011. FINDINGS: Cardiovascular: Heart size is mildly enlarged. There is no significant pericardial fluid, thickening or pericardial calcification. Right upper extremity PICC with tip terminating at the superior cavoatrial junction. Mediastinum/Nodes: No pathologically enlarged mediastinal or hilar lymph nodes. Please note that accurate exclusion of hilar adenopathy is limited  on noncontrast CT scans. Esophagus is unremarkable  in appearance. No axillary lymphadenopathy. Lungs/Pleura: There is an unusual pattern of some patchy ground-glass attenuation and very mild interlobular septal thickening in the lung apices, as well as some mild peripheral predominant interlobular septal thickening in the extreme lung bases. No confluent consolidative airspace disease. No pleural effusions. No suspicious appearing pulmonary nodules or masses are noted. Upper Abdomen: Status post cholecystectomy. Musculoskeletal: There are no aggressive appearing lytic or blastic lesions noted in the visualized portions of the skeleton. IMPRESSION: 1. Unusual appearance of the lungs. Based on the recent chest radiographs from 04/09/2018, as well as 04/10/2018, which showed significant worsening in aeration between those two examinations, but apparent improvement in aeration in the lung parenchyma on today's chest CT, the overall pattern is one of presumed pulmonary edema (now resolving), likely related to the patient's recent blood transfusions. Attention on follow-up radiographs is recommended to ensure complete resolution of these findings. Should the patient clinically deteriorate, repeat chest x-rays would be advised to exclude the possibility of an atypical viral infection. No definitive findings to suggest fungal pneumonia at this time. These results were called by telephone at the time of interpretation on 04/11/2018 at 11:17 am to Dr. Earlie Server , who verbally acknowledged these results. Electronically Signed   By: Vinnie Langton M.D.   On: 04/11/2018 11:18     ASSESSMENT AND PLAN:   48 year old female with past medical history of AML currently ongoing chemotherapy, history of pancytopenia, depression, GERD, hypertension who presented to the hospital due to neutropenic fevers.  1.  Neutropenic fevers- still having low-grade fevers and had a fever of 101 overnight. -Blood cultures remain negative, urine  cultures growing diphtheroids which is likely contaminants.  Patient's MRSA PCR was negative therefore off vancomycin.  Continue cefepime and Flagyl empirically for now. Discussed with oncology and patient had a CT chest yesterday which showed no evidence of fungal pneumonia but likely pulmonary edema/atypical viral infection.  Discussed with oncology and will send off Noval coronavirus testing as pt. Is high risk as she is immunocompromised.  - also will get ID consult.   - discussed plan with patient, Dr. Tasia Catchings and Dr. Delaine Lame.   2. R/o COVID 19 - pt. Continues to have fever but source unclear.  -Add on Noval coronavirus testing - droplet/contact precautions.   3.  Pancytopenia-secondary to underlying AML with ongoing chemotherapy. -Hemoglobin improved posttransfusion yesterday up to 8.6.  Platelet count up to 29.  No acute bleeding.  We will continue to follow serial counts.  4.  Hypokalemia- will supplement and repeat in a.m.  - check Mg. Level.    5.  GERD-continue Protonix.  6.  Essential hypertension-continue losartan, carvedilol - BP stable  7.  Anxiety/depression-continue BuSpar, Effexor.  Discussed with Dr. Tasia Catchings, Dr. Delaine Lame.    All the records are reviewed and case discussed with Care Management/Social Worker. Management plans discussed with the patient, family and they are in agreement.  CODE STATUS: Full code  DVT Prophylaxis: Ted's & SCD's  TOTAL TIME TAKING CARE OF THIS PATIENT: 30 minutes.   POSSIBLE D/C IN 1-2 DAYS , DEPENDING ON CLINICAL CONDITION.   Henreitta Leber M.D on 04/12/2018 at 11:22 AM  Between 7am to 6pm - Pager - [phone]  After 6pm go to [email or web address] - Proofreader  [facility]  Office  [phone]  CC: Primary care physician; [place] Pink, MD

## 2018-04-12 NOTE — Progress Notes (Signed)
Pt to transfer to 2A room 242.  Report called to [place].  [id number]

## 2018-04-12 NOTE — Progress Notes (Signed)
Patient transferred to 2A from 1C.  Report given to this RN from [employer].  Patient oriented to surroundings.
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
## 2018-04-12 NOTE — Progress Notes (Signed)
Pt transported via wheelchair with transporters x 2 at chairside.  [id number]

## 2018-04-12 NOTE — Progress Notes (Signed)
Patient continues to spike fever last night, this morning afebrile. Labs are reviewed.  No need for blood transfusion today.  Hemoglobin 8.6 and platelet count 29,000. Her blood cultures are negative.  Influenza negative.  Respiratory panel was done on 3/17 outpatient which was also negative. Called patient and she tells me that she has some runny nose, mild cough which is better than the day before and yesterday. I discussed patient's case with hospitalist team Dr. Verdell Carmine and recommend ID consultation.  As I stated in my consultation note, I recommend testing her for COVID-19 given her chronic neutropenic and immunocompromised state, she may not have all the typical respiratory symptoms for COVID-19 infection.

## 2018-04-12 NOTE — Telephone Encounter (Signed)
After doing patient's travel screening, she stated that she was currently at the hospital because she has a cough, SOB and fever. Patient stated that she was waiting to be tested for COVID-19. Please contact patient to reschedule her appointments. Thank you.

## 2018-04-13 LAB — CBC
HCT: 23.9 % — ABNORMAL LOW (ref 36.0–46.0)
HEMOGLOBIN: 8.2 g/dL — AB (ref 12.0–15.0)
MCH: 28.8 pg (ref 26.0–34.0)
MCHC: 34.3 g/dL (ref 30.0–36.0)
MCV: 83.9 fL (ref 80.0–100.0)
Platelets: 43 10*3/uL — ABNORMAL LOW (ref 150–400)
RBC: 2.85 MIL/uL — ABNORMAL LOW (ref 3.87–5.11)
RDW: 13 % (ref 11.5–15.5)
WBC: 1 10*3/uL — CL (ref 4.0–10.5)
nRBC: 0 % (ref 0.0–0.2)

## 2018-04-13 NOTE — Progress Notes (Signed)
[facility] Hematology/Oncology Progress Note  Date of admission: 04/09/2018  Hospital day:  04/13/2018  Chief Complaint: Rhonda Kane is a 48 y.o. female with AML who was admitted with fever and neutropenia  Allergies: No Known Allergies  Scheduled Medications: . busPIRone  5 mg Oral BID  . carvedilol  6.25 mg Oral BID  . chlorhexidine  10 mL Mouth/Throat BID  . docusate sodium  100 mg Oral BID  . losartan  25 mg Oral Daily  . pantoprazole  40 mg Oral Daily  . posaconazole  300 mg Oral Daily  . potassium chloride SA  20 mEq Oral Daily  . valACYclovir  500 mg Oral BID  . venlafaxine XR  300 mg Oral Daily    Results for orders placed or performed during the hospital encounter of 04/09/18 (from the past 48 hour(s))  Basic metabolic panel     Status: Abnormal   Collection Time: 04/12/18  6:46 AM  Result Value Ref Range   Sodium 138 135 - 145 mmol/L   Potassium 3.0 (L) 3.5 - 5.1 mmol/L   Chloride 104 98 - 111 mmol/L   CO2 26 22 - 32 mmol/L   Glucose, Bld 103 (H) 70 - 99 mg/dL   BUN 13 6 - 20 mg/dL   Creatinine, Ser 0.92 0.44 - 1.00 mg/dL   Calcium 8.8 (L) 8.9 - 10.3 mg/dL   GFR calc non Af Amer >60 >60 mL/min   GFR calc Af Amer >60 >60 mL/min   Anion gap 8 5 - 15    Comment: Performed at [facility], [address] [place], [place] [address]  CBC     Status: Abnormal   Collection Time: 04/12/18  6:46 AM  Result Value Ref Range   WBC 0.8 (LL) 4.0 - 10.5 K/uL    Comment: CRITICAL VALUE NOTED.  VALUE IS CONSISTENT WITH PREVIOUSLY REPORTED AND CALLED VALUE.   RBC 2.91 (L) 3.87 - 5.11 MIL/uL   Hemoglobin 8.6 (L) 12.0 - 15.0 g/dL   HCT 24.0 (L) 36.0 - 46.0 %   MCV 82.5 80.0 - 100.0 fL   MCH 29.6 26.0 - 34.0 pg   MCHC 35.8 30.0 - 36.0 g/dL   RDW 12.9 11.5 - 15.5 %   Platelets 29 (LL) 150 - 400 K/uL    Comment: PLATELET COUNT CONFIRMED BY SMEAR Immature Platelet Fraction may be clinically indicated, consider ordering this additional  test [id number] CRITICAL VALUE NOTED.  VALUE IS CONSISTENT WITH PREVIOUSLY REPORTED AND CALLED VALUE.    nRBC 0.0 0.0 - 0.2 %    Comment: Performed at [facility], [address] [place], [place] [address]  Magnesium     Status: None   Collection Time: 04/12/18  6:46 AM  Result Value Ref Range   Magnesium 1.8 1.7 - 2.4 mg/dL    Comment: Performed at [facility], [address] [place], [place] [address]  CBC     Status: Abnormal   Collection Time: 04/13/18  5:01 AM  Result Value Ref Range   WBC 1.0 (LL) 4.0 - 10.5 K/uL    Comment: CRITICAL VALUE NOTED.  VALUE IS CONSISTENT WITH PREVIOUSLY REPORTED AND CALLED VALUE.   RBC 2.85 (L) 3.87 - 5.11 MIL/uL   Hemoglobin 8.2 (L) 12.0 - 15.0 g/dL   HCT 23.9 (L) 36.0 - 46.0 %   MCV 83.9 80.0 - 100.0 fL   MCH 28.8 26.0 - 34.0 pg   MCHC 34.3 30.0 -  36.0 g/dL   RDW 13.0 11.5 - 15.5 %   Platelets 43 (L) 150 - 400 K/uL    Comment: Immature Platelet Fraction may be clinically indicated, consider ordering this additional test [id number]    nRBC 0.0 0.0 - 0.2 %    Comment: Performed at [facility], [address] [place], [place] [address]   No results found.  Assessment:  Rhonda Kane is a 48 y.o. female with AML who was admitted with fever and neutropenia.  Chest CT on 04/11/2018 revealed an unusual appearance of the lungs. Based on the recent chest radiographs from 04/09/2018, as well as 04/10/2018, which showed significant worsening in aeration between those two examinations, but apparent improvement in aeration in the lung parenchyma on today's exam, the overall pattern is one of presumed pulmonary edema (now resolving), likely related to the patient's recent blood transfusions. Attention on follow-up radiographs is recommended to ensure complete resolution of these findings  Plan: 1. 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  Patient s/p induction  chemotherapy (CPX-351 clinical trial) complicated by anthracycline induced cardiomyopathy.  She has received 3 cycles of decitabine.  Bone marrow with each cycle revealed persistent blasts.  She is followed by Dr Tasia Catchings [facility]) and [facility] (Dr Janene Madeira). 2. Pancytopenia  Low counts secondary to AML, chemotherapy, and possibly infection.  Hematocrit 23.9, hemoglobin 8.2, platelets 43,000, WBC 1000.  Daily CBC with diff.  Maintain hemoglobin > 7 and  platelets > 20,000.  Blood products leukopoor and irradiated.  Neutropenic precautions. 3. Infectious disease  Patient on Cefepime + prophylactic antibiotics (posoconazole, valacyclovir).  Fever to 102 on 04/09/2018.  Tmax past 24 hours 101.9.  Chest CT as above felt secondary to volume overload, improving.  Blood cultures negative.  Influenza negative.  COVID-19 pending.  Blood culture q 24 hour prn temp >= 100.4.  Appreciate ID consult. 4. Disposition  Inpatient stay continues secondary to ongoing fever.  Call if any questions or concerns.   Lequita Asal, MD  04/13/2018, 11:05 AM

## 2018-04-13 NOTE — Progress Notes (Signed)
[facility] NAME: Rhonda Kane    MR#:  [id number]  DATE OF BIRTH:  [birth year]  SUBJECTIVE:   Pt with no fever overnight  CT chest 3/26 showing fluid overload, pulm. Edema but ?? Atypical viral infection.    Clinically feels a lot better.    REVIEW OF SYSTEMS:    Review of Systems  Constitutional: Negative for fever and weight loss.  HENT: Negative for congestion, nosebleeds and tinnitus.   Eyes: Negative for blurred vision, double vision and redness.  Respiratory: Negative for cough, hemoptysis and shortness of breath.   Cardiovascular: Negative for chest pain, orthopnea, leg swelling and PND.  Gastrointestinal: Negative for abdominal pain, diarrhea, melena, nausea and vomiting.  Genitourinary: Negative for dysuria, hematuria and urgency.  Musculoskeletal: Negative for falls and joint pain.  Neurological: Negative for dizziness, tingling, sensory change, focal weakness, seizures, weakness and headaches.  Endo/Heme/Allergies: Negative for polydipsia. Does not bruise/bleed easily.  Psychiatric/Behavioral: Negative for depression and memory loss. The patient is not nervous/anxious.     Nutrition: Heart Healthy Tolerating Diet: Yes Tolerating PT: Ambulatory   DRUG ALLERGIES:  No Known Allergies  VITALS:  Blood pressure (!) 145/88, pulse 65, temperature 99.1 F (37.3 C), temperature source Oral, resp. rate 19, height 5\' 4"  (1.626 m), weight 99.3 kg, SpO2 95 %.  PHYSICAL EXAMINATION:   Physical Exam  GENERAL:  48 y.o.-year-old patient lying in bed in no acute distress.  EYES: Pupils equal, round, reactive to light and accommodation. No scleral icterus. Extraocular muscles intact.  HEENT: Head atraumatic, normocephalic. Oropharynx and nasopharynx clear.  NECK:  Supple, no jugular venous distention. No thyroid enlargement, no tenderness.  LUNGS: Normal breath sounds bilaterally, no wheezing, rales, rhonchi. No use of  accessory muscles of respiration.  CARDIOVASCULAR: S1, S2 normal. No murmurs, rubs, or gallops.  ABDOMEN: Soft, nontender, nondistended. Bowel sounds present. No organomegaly or mass.  EXTREMITIES: No cyanosis, clubbing or edema b/l.    NEUROLOGIC: Cranial nerves II through XII are intact. No focal Motor or sensory deficits b/l.   PSYCHIATRIC: The patient is alert and oriented x 3.  SKIN: No obvious rash, lesion, or ulcer.    LABORATORY PANEL:   CBC Recent Labs  Lab 04/13/18 0501  WBC 1.0*  HGB 8.2*  HCT 23.9*  PLT 43*   ------------------------------------------------------------------------------------------------------------------  Chemistries  Recent Labs  Lab 04/09/18 1555 04/12/18 0646  NA 134* 138  K 3.1* 3.0*  CL 102 104  CO2 25 26  GLUCOSE 118* 103*  BUN 13 13  CREATININE 1.12* 0.92  CALCIUM 8.6* 8.8*  MG  --  1.8  AST 21  --   ALT 23  --   ALKPHOS 59  --   BILITOT 1.1  --    ------------------------------------------------------------------------------------------------------------------  Cardiac Enzymes No results for input(s): TROPONINI in the last 168 hours. ------------------------------------------------------------------------------------------------------------------  RADIOLOGY:  No results found.   ASSESSMENT AND PLAN:   48 year old female with past medical history of AML currently ongoing chemotherapy, history of pancytopenia, depression, GERD, hypertension who presented to the hospital due to neutropenic fevers.  1.  Neutropenic fevers- no fever overnight  -Blood cultures remain negative, urine cultures growing diphtheroids which is likely contaminants.  Patient's MRSA PCR was negative therefore off vancomycin.  Continue cefepime and Flagyl empirically for now. Discussed with oncology and patient had a CT chest 3/26 which showed no evidence of fungal pneumonia but likely pulmonary edema/atypical viral infection.  As per  Dr. Collie Siad discussion  with Hem/Transplant Team Dr. Janene Madeira -Oncology has recommended  Noval coronavirus testing as pt. Is high risk as she is immunocompromised.  Testing done results pending -Appreciate ID recommendations -Dr. Mike Gip is following through the weekend. Dr. Tasia Catchings has recommended to discharge patient if she is afebrile for 48 hours with all her prophylactic antibiotics, antifungal and antiviral  2. R/o COVID 19 -no fever overnight -Pending Noval coronavirus testing - droplet/contact precautions.   3.  Pancytopenia-secondary to underlying AML with ongoing chemotherapy. -Hemoglobin improved posttransfusion yesterday up to 8.6--8.2.  Platelet count up to 29--43,000.  No acute bleeding.  We will continue to follow serial counts.  4.  Hypokalemia- will supplement and repeat in a.m.  -Magnesium at 1.8  5.  GERD-continue Protonix.  6.  Essential hypertension-continue losartan, carvedilol - BP stable  7.  Anxiety/depression-continue BuSpar, Effexor.  8.  Generalized weakness PT consult after COVID test results     All the records are reviewed and case discussed with Care Management/Social Worker. Management plans discussed with the patient,she is  in agreement.  CODE STATUS: Full code  DVT Prophylaxis: Ted's & SCD's  TOTAL TIME TAKING CARE OF THIS PATIENT: 32 minutes.   POSSIBLE D/C IN 1-2 DAYS , DEPENDING ON CLINICAL CONDITION.   Nicholes Mango M.D on 04/13/2018 at 1:29 PM  Between 7am to 6pm - Pager - [phone]  After 6pm go to [email or web address] - Proofreader  [facility]  Office  [phone]  CC: Primary care physician; [place] Pink, MD

## 2018-04-14 LAB — CULTURE, BLOOD (ROUTINE X 2)
Culture: NO GROWTH
Culture: NO GROWTH
SPECIAL REQUESTS: ADEQUATE

## 2018-04-14 LAB — BASIC METABOLIC PANEL
Anion gap: 8 (ref 5–15)
BUN: 9 mg/dL (ref 6–20)
CO2: 25 mmol/L (ref 22–32)
CREATININE: 0.71 mg/dL (ref 0.44–1.00)
Calcium: 8.7 mg/dL — ABNORMAL LOW (ref 8.9–10.3)
Chloride: 104 mmol/L (ref 98–111)
GFR calc Af Amer: >60 mL/min (ref >60)
GFR calc non Af Amer: >60 mL/min (ref >60)
GLUCOSE: 104 mg/dL — AB (ref 70–99)
Potassium: 3 mmol/L — ABNORMAL LOW (ref 3.5–5.1)
Sodium: 137 mmol/L (ref 135–145)

## 2018-04-14 LAB — CBC WITH DIFFERENTIAL/PLATELET
Abs Immature Granulocytes: 0 10*3/uL (ref 0.00–0.07)
Basophils Absolute: 0 10*3/uL (ref 0.0–0.1)
Basophils Relative: 0 %
Eosinophils Absolute: 0 10*3/uL (ref 0.0–0.5)
Eosinophils Relative: 0 %
HCT: 24.3 % — ABNORMAL LOW (ref 36.0–46.0)
Hemoglobin: 8.5 g/dL — ABNORMAL LOW (ref 12.0–15.0)
Immature Granulocytes: 0 %
LYMPHS PCT: 39 %
Lymphs Abs: 0.4 10*3/uL — ABNORMAL LOW (ref 0.7–4.0)
MCH: 29 pg (ref 26.0–34.0)
MCHC: 35 g/dL (ref 30.0–36.0)
MCV: 82.9 fL (ref 80.0–100.0)
Monocytes Absolute: 0.6 10*3/uL (ref 0.1–1.0)
Monocytes Relative: 58 %
NEUTROS PCT: 3 %
Neutro Abs: 0 10*3/uL — ABNORMAL LOW (ref 1.7–7.7)
Platelets: 64 10*3/uL — ABNORMAL LOW (ref 150–400)
RBC: 2.93 MIL/uL — ABNORMAL LOW (ref 3.87–5.11)
RDW: 12.8 % (ref 11.5–15.5)
Smear Review: DECREASED
WBC: 1.1 10*3/uL — CL (ref 4.0–10.5)
nRBC: 0 % (ref 0.0–0.2)

## 2018-04-14 MED ORDER — SODIUM CHLORIDE 0.9% FLUSH
10.0000 mL | Freq: Two times a day (BID) | INTRAVENOUS | Status: DC
  Administered 2018-04-14: 10 mL

## 2018-04-14 MED ORDER — BUSPIRONE HCL 5 MG PO TABS: 5.0000 mg | ORAL_TABLET | Freq: Two times a day (BID) | ORAL | 0 refills | Status: AC

## 2018-04-14 MED ORDER — VENLAFAXINE HCL ER 150 MG PO CP24: 300.0000 mg | ORAL_CAPSULE | Freq: Every day | ORAL | 0 refills | Status: AC

## 2018-04-14 MED ORDER — POTASSIUM CHLORIDE 20 MEQ PO PACK
20.0000 meq | PACK | Freq: Every day | ORAL | Status: DC
  Administered 2018-04-14: 20 meq via ORAL
  Filled 2018-04-14: qty 1

## 2018-04-14 MED ORDER — POTASSIUM CHLORIDE CRYS ER 20 MEQ PO TBCR
40.0000 meq | EXTENDED_RELEASE_TABLET | Freq: Once | ORAL | Status: AC
  Administered 2018-04-14: 40 meq via ORAL
  Filled 2018-04-14: qty 2

## 2018-04-14 MED ORDER — SODIUM CHLORIDE 0.9% FLUSH
10.0000 mL | INTRAVENOUS | Status: DC | PRN
  Administered 2018-04-14: 20 mL
  Filled 2018-04-14: qty 40

## 2018-04-14 MED ORDER — POLYETHYLENE GLYCOL 3350 17 G PO PACK: 17.0000 g | PACK | Freq: Every day | ORAL | 0 refills | Status: DC | PRN

## 2018-04-14 NOTE — Discharge Summary (Signed)
Rhonda Kane at Rhonda Kane    MR#:  [id number]  DATE OF BIRTH:  [birth year]  DATE OF ADMISSION:  04/09/2018 ADMITTING PHYSICIAN: Rhonda Harms, MD  DATE OF DISCHARGE: 04/14/2018   PRIMARY CARE PHYSICIAN: Rhonda Pink, MD    ADMISSION DIAGNOSIS:  Neutropenic fever ([place]) [D70.9, R50.81]  DISCHARGE DIAGNOSIS:  Active Problems:   Neutropenic fever ([place]) Pending COVID-19 test at the time of discharge, self quarantine until results are available  SECONDARY DIAGNOSIS:   Past Medical History:  Diagnosis Date  . Breast mass, right 2015  . Depression   . Dysmenorrhea   . GERD (gastroesophageal reflux disease)   . Headache    MIGRAINES  . Insomnia   . Insulin resistance   . Irregular menses   . Menometrorrhagia   . Migraine with aura   . PMS (premenstrual syndrome)   . PONV (postoperative nausea and vomiting)     HOSPITAL COURSE:  HISTORY OF PRESENT ILLNESS: Rhonda Kane  is a 48 y.o. 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-on chemotherapy, last treatment was 2 weeks ago, was at [facility] on yesterday-had bone marrow biopsy as well as 2 unit platelet transfusion, developed chills that started on yesterday, fevers today, nonproductive infrequent cough, nausea, denies any sick contacts, brought to the emergency room via EMS, noted mild tachycardia, tachypnea, white count 0.9 which is stable, hemoglobin 7.2-stable, platelet count 14 down from 17, sodium 133, UA negative, chest x-ray negative, sepsis protocol started in the emergency room, started on vancomycin/cefepime, patient valuated emergency room, no apparent distress, resting comfortably in bed, patient is now been admitted for acute neutropenic fever with associated sepsis, acute on chronic pancytopenia without evidence for bleeding.   1.  Neutropenic fevers- no fever overnight  -Blood cultures remain  negative, urine cultures growing diphtheroids which is likely contaminants.  Patient's MRSA PCR was negative therefore off vancomycin.  Continue cefepime and Flagyl empirically for now. Discussed with oncology and patient had a CT chest 3/26 which showed no evidence of fungal pneumonia but likely pulmonary edema/atypical viral infection.  As per Dr. Collie Kane discussion with Hem/Transplant Team Dr. Janene Kane -Oncology has recommended  Noval coronavirus testing as pt. Is high risk as she is immunocompromised.  Testing done results pending -Appreciate ID recommendations recommended to discharge patient with patient's home prophylactic medications if blood cultures are negative -Dr. Mike Kane is following through the weekend. Dr. Tasia Kane has recommended to discharge patient if she is afebrile for 48 hours with all her prophylactic antibiotics levofloxacin, antifungal and antiviral valacyclovir.  Discussed with Dr. Mike Kane today agreeable with the current plan as patient is afebrile for more than 48 hours -Patient is to call and make an appointment with Dr. Tasia Kane in a week or as recommended.  If she spikes fever greater than or equal to 100.4 please call oncology office so that they can page Dr. Mike Kane  2. R/o COVID 19 -no fever overnight -Pending Noval coronavirus testing.  Follow-up with primary care physician - droplet/contact precautions.  -Self quarantine until test results are available  3.  Pancytopenia-secondary to underlying AML with ongoing chemotherapy. -Hemoglobin improved posttransfusion yesterday up to 8.6--8.2. -8.5 Platelet count up to 29--43,000.-64,000 No acute bleeding.    4.  Hypokalemia-  given potassium supplements.  PCP to repeat CBC and BMP during the follow-up visit -Magnesium at 1.8  5.  GERD-continue Protonix.  6.  Essential hypertension-continue losartan, carvedilol -  BP stable  7.  Anxiety/depression-continue BuSpar, Effexor.  8.  Generalized weakness-patient is doing  fine and refused PT  Discharge patient home  DISCHARGE CONDITIONS:   Fair   CONSULTS OBTAINED:  Treatment Team:  Rhonda Billing, MD   PROCEDURES  None   DRUG ALLERGIES:  No Known Allergies  DISCHARGE MEDICATIONS:   Allergies as of 04/14/2018   No Known Allergies     Medication List    STOP taking these medications   chlorhexidine 0.12 % solution Commonly known as:  Peridex   potassium chloride SA 20 MEQ tablet Commonly known as:  K-DUR,KLOR-CON   venlafaxine 100 MG tablet Commonly known as:  EFFEXOR Replaced by:  venlafaxine XR 150 MG 24 hr capsule     TAKE these medications   busPIRone 5 MG tablet Commonly known as:  BUSPAR Take 1 tablet (5 mg total) by mouth 2 (two) times daily. What changed:  when to take this   carvedilol 6.25 MG tablet Commonly known as:  COREG Take 6.25 mg by mouth 2 (two) times daily.   ibuprofen 200 MG tablet Commonly known as:  ADVIL,MOTRIN Take 600 mg by mouth daily as needed.   levofloxacin 500 MG tablet Commonly known as:  LEVAQUIN Take 500 mg by mouth daily.   losartan 25 MG tablet Commonly known as:  COZAAR Take 12.5 mg by mouth daily.   pantoprazole 40 MG tablet Commonly known as:  PROTONIX Take 40 mg by mouth daily.   polyethylene glycol packet Commonly known as:  MIRALAX / GLYCOLAX Take 17 g by mouth daily as needed for mild constipation.   posaconazole 100 MG Tbec delayed-release tablet Commonly known as:  NOXAFIL Take 100 mg by mouth 3 (three) times daily.   Potassium 99 MG Tabs Take 99 mg by mouth daily.   prochlorperazine 5 MG tablet Commonly known as:  COMPAZINE Take 5 mg by mouth every 8 (eight) hours as needed for nausea or vomiting.   traZODone 50 MG tablet Commonly known as:  DESYREL Take 50 mg by mouth at bedtime as needed for sleep.   valACYclovir 500 MG tablet Commonly known as:  VALTREX Take 500 mg by mouth daily.   venlafaxine XR 150 MG 24 hr capsule Commonly known as:   EFFEXOR-XR Take 2 capsules (300 mg total) by mouth daily. Start taking on:  April 15, 2018 Replaces:  venlafaxine 100 MG tablet        DISCHARGE INSTRUCTIONS:   Follow-up with primary care physician in 3 to 4 days Follow-up with oncology Dr. Tasia Kane in a week Self quarantine until COVID-19 test results are available  DIET:  Cardiac diet  DISCHARGE CONDITION:  Stable  ACTIVITY:  Activity as tolerated  OXYGEN:  Home Oxygen: No.   Oxygen Delivery: room air  DISCHARGE LOCATION:  home   If you experience worsening of your admission symptoms, develop shortness of breath, life threatening emergency, suicidal or homicidal thoughts you must seek medical attention immediately by calling 911 or calling your MD immediately  if symptoms less severe.  You Must read complete instructions/literature along with all the possible adverse reactions/side effects for all the Medicines you take and that have been prescribed to you. Take any new Medicines after you have completely understood and accpet all the possible adverse reactions/side effects.   Please note  You were cared for by a hospitalist during your hospital stay. If you have any questions about your discharge medications or the care you received while you were in  the hospital after you are discharged, you can call the unit and asked to speak with the hospitalist on call if the hospitalist that took care of you is not available. Once you are discharged, your primary care physician will handle any further medical issues. Please note that NO REFILLS for any discharge medications will be authorized once you are discharged, as it is imperative that you return to your primary care physician (or establish a relationship with a primary care physician if you do not have one) for your aftercare needs so that they can reassess your need for medications and monitor your lab values.     Today  Chief Complaint  Patient presents with  . Fever    Patient is feeling fine.  No fever for the past 48 hours.  Denies any shortness of breath.  Very desperate to go home.  Dr. Mike Kane is okay to discharge patient  ROS:  CONSTITUTIONAL: Denies fevers, chills. Denies any fatigue, weakness.  EYES: Denies blurry vision, double vision, eye pain. EARS, NOSE, THROAT: Denies tinnitus, ear pain, hearing loss. RESPIRATORY: Denies cough, wheeze, shortness of breath.  CARDIOVASCULAR: Denies chest pain, palpitations, edema.  GASTROINTESTINAL: Denies nausea, vomiting, diarrhea, abdominal pain. Denies bright red blood per rectum. GENITOURINARY: Denies dysuria, hematuria. ENDOCRINE: Denies nocturia or thyroid problems. HEMATOLOGIC AND LYMPHATIC: Denies easy bruising or bleeding. SKIN: Denies rash or lesion. MUSCULOSKELETAL: Denies pain in neck, back, shoulder, knees, hips or arthritic symptoms.  NEUROLOGIC: Denies paralysis, paresthesias.  PSYCHIATRIC: Denies anxiety or depressive symptoms.   VITAL SIGNS:  Blood pressure (!) 146/99, pulse 90, temperature 99.1 F (37.3 C), temperature source Oral, resp. rate 19, height '5\' 4"'$  (1.626 m), weight 99.3 kg, SpO2 95 %.  I/O:    Intake/Output Summary (Last 24 hours) at 04/14/2018 1243 Last data filed at 04/14/2018 0914 Gross per 24 hour  Intake 128.97 ml  Output 950 ml  Net -821.03 ml    PHYSICAL EXAMINATION:  GENERAL:  48 y.o.-year-old patient lying in the bed with no acute distress.  EYES: Pupils equal, round, reactive to light and accommodation. No scleral icterus. Extraocular muscles intact.  HEENT: Head atraumatic, normocephalic. Oropharynx and nasopharynx clear.  NECK:  Supple, no jugular venous distention. No thyroid enlargement, no tenderness.  LUNGS: Normal breath sounds bilaterally, no wheezing, rales,rhonchi or crepitation. No use of accessory muscles of respiration.  CARDIOVASCULAR: S1, S2 normal. No murmurs, rubs, or gallops.  ABDOMEN: Soft, non-tender, non-distended. Bowel sounds  present. No organomegaly or mass.  EXTREMITIES: No pedal edema, cyanosis, or clubbing.  NEUROLOGIC: Awake, alert and oriented x3 sensation intact. Gait not checked.  PSYCHIATRIC: The patient is alert and oriented x 3.  SKIN: No obvious rash, lesion, or ulcer.   DATA REVIEW:   CBC Recent Labs  Lab 04/14/18 0608  WBC 1.1*  HGB 8.5*  HCT 24.3*  PLT 64*    Chemistries  Recent Labs  Lab 04/09/18 1555 04/12/18 0646 04/14/18 0608  NA 134* 138 137  K 3.1* 3.0* 3.0*  CL 102 104 104  CO2 '25 26 25  '$ GLUCOSE 118* 103* 104*  BUN '13 13 9  '$ CREATININE 1.12* 0.92 0.71  CALCIUM 8.6* 8.8* 8.7*  MG  --  1.8  --   AST 21  --   --   ALT 23  --   --   ALKPHOS 59  --   --   BILITOT 1.1  --   --     Cardiac Enzymes No results for input(s): TROPONINI in  the last 168 hours.  Microbiology Results  Results for orders placed or performed during the hospital encounter of 04/09/18  Blood Culture (routine x 2)     Status: None   Collection Time: 04/09/18 11:54 AM  Result Value Ref Range Status   Specimen Description BLOOD LEFT ANTECUBITAL  Final   Special Requests   Final    BOTTLES DRAWN AEROBIC AND ANAEROBIC Blood Culture results may not be optimal due to an excessive volume of blood received in culture bottles   Culture   Final    NO GROWTH 5 DAYS Performed at [facility], [address] [place], [place] [address]    Report Status 04/14/2018 FINAL  Final  Blood Culture (routine x 2)     Status: None   Collection Time: 04/09/18 11:54 AM  Result Value Ref Range Status   Specimen Description BLOOD BLOOD LEFT HAND  Final   Special Requests   Final    BOTTLES DRAWN AEROBIC AND ANAEROBIC Blood Culture adequate volume   Culture   Final    NO GROWTH 5 DAYS Performed at [facility], [address] [place], [place] [address]    Report Status 04/14/2018 FINAL  Final  Urine culture     Status: Abnormal   Collection Time: 04/09/18 11:54 AM  Result Value Ref Range  Status   Specimen Description   Final    URINE, RANDOM Performed at [facility], [address], [place], [place] [address]    Special Requests   Final    NONE Performed at [facility], [address], [place], [place] [address]    Culture (A)  Final    80,000 COLONIES/mL DIPHTHEROIDS(CORYNEBACTERIUM SPECIES) Standardized susceptibility testing for this organism is not available. Performed at [facility], [place] [address], [place], [place] [address]    Report Status 04/10/2018 FINAL  Final  MRSA PCR Screening     Status: None   Collection Time: 04/10/18 12:30 PM  Result Value Ref Range Status   MRSA by PCR NEGATIVE NEGATIVE Final    Comment:        The GeneXpert MRSA Assay (FDA approved for NASAL specimens only), is one component of a comprehensive MRSA colonization surveillance program. It is not intended to diagnose MRSA infection nor to guide or monitor treatment for MRSA infections. Performed at [facility], [place], [place] [address]     RADIOLOGY:  Ct Chest Wo Contrast  Result Date: 04/11/2018 CLINICAL DATA:  48 year old female with history of leukemia. EXAM: CT CHEST WITHOUT CONTRAST TECHNIQUE: Multidetector CT imaging of the chest was performed following the standard protocol without IV contrast. COMPARISON:  Chest CT 02/06/2011. FINDINGS: Cardiovascular: Heart size is mildly enlarged. There is no significant pericardial fluid, thickening or pericardial calcification. Right upper extremity PICC with tip terminating at the superior cavoatrial junction. Mediastinum/Nodes: No pathologically enlarged mediastinal or hilar lymph nodes. Please note that accurate exclusion of hilar adenopathy is limited on noncontrast CT scans. Esophagus is unremarkable in appearance. No axillary lymphadenopathy. Lungs/Pleura: There is an unusual pattern of some patchy ground-glass attenuation and very mild interlobular septal thickening in the  lung apices, as well as some mild peripheral predominant interlobular septal thickening in the extreme lung bases. No confluent consolidative airspace disease. No pleural effusions. No suspicious appearing pulmonary nodules or masses are noted. Upper Abdomen: Status post cholecystectomy. Musculoskeletal: There are no aggressive appearing lytic or blastic lesions noted in the visualized portions of  the skeleton. IMPRESSION: 1. Unusual appearance of the lungs. Based on the recent chest radiographs from 04/09/2018, as well as 04/10/2018, which showed significant worsening in aeration between those two examinations, but apparent improvement in aeration in the lung parenchyma on today's chest CT, the overall pattern is one of presumed pulmonary edema (now resolving), likely related to the patient's recent blood transfusions. Attention on follow-up radiographs is recommended to ensure complete resolution of these findings. Should the patient clinically deteriorate, repeat chest x-rays would be advised to exclude the possibility of an atypical viral infection. No definitive findings to suggest fungal pneumonia at this time. These results were called by telephone at the time of interpretation on 04/11/2018 at 11:17 am to Dr. Earlie Server , who verbally acknowledged these results. Electronically Signed   By: Vinnie Langton M.D.   On: 04/11/2018 11:18    EKG:   Orders placed or performed during the hospital encounter of 04/09/18  . ED EKG 12-Lead  . ED EKG 12-Lead      Management plans discussed with the patient, she is in agreement.  CODE STATUS:     Code Status Orders  (From admission, onward)         Start     Ordered   04/09/18 1521  Full code  Continuous     04/09/18 1521        Code Status History    Date Active Date Inactive Code Status Order ID Comments User Context   02/02/2018 1610 02/03/2018 1602 Full Code [id number]  Demetrios Loll, MD Inpatient   12/13/2017 2243 12/15/2017 1611 DNR [id number]   Demetrios Loll, MD Inpatient      TOTAL TIME TAKING CARE OF THIS PATIENT: 43  minutes.   Note: This dictation was prepared with Dragon dictation along with smaller phrase technology. Any transcriptional errors that result from this process are unintentional.   '@MEC'$ @  on 04/14/2018 at 12:43 PM  Between 7am to 6pm - Pager - [phone]  After 6pm go to [email or web address] - password [email or web address]  Office  [phone]  CC: Primary care physician; Rhonda Pink, MD

## 2018-04-14 NOTE — Plan of Care (Signed)
  Problem: Education: Goal: Knowledge of General Education information will improve Description Including pain rating scale, medication(s)/side effects and non-pharmacologic comfort measures Outcome: Adequate for Discharge   Problem: Health Behavior/Discharge Planning: Goal: Ability to manage health-related needs will improve Outcome: Adequate for Discharge   Problem: Clinical Measurements: Goal: Ability to maintain clinical measurements within normal limits will improve Outcome: Adequate for Discharge   Problem: Fluid Volume: Goal: Hemodynamic stability will improve Outcome: Adequate for Discharge   Problem: Clinical Measurements: Goal: Diagnostic test results will improve Outcome: Adequate for Discharge

## 2018-04-14 NOTE — Progress Notes (Signed)
Patient given discharge instructions along with prescriptions and covid-19 discharge packet. Patient verbalized understanding with no questions or concerns. Patient going home via family vehicle.

## 2018-04-15 ENCOUNTER — Inpatient Hospital Stay: Payer: 59 | Admitting: Oncology

## 2018-04-15 ENCOUNTER — Inpatient Hospital Stay: Payer: 59

## 2018-04-15 LAB — NOVEL CORONAVIRUS, NAA (HOSP ORDER, SEND-OUT TO REF LAB; TAT 18-24 HRS): SARS-CoV-2, NAA: NOT DETECTED

## 2018-04-16 ENCOUNTER — Telehealth: Payer: Self-pay | Admitting: Oncology

## 2018-04-16 ENCOUNTER — Inpatient Hospital Stay: Payer: 59

## 2018-04-16 NOTE — Telephone Encounter (Signed)
Covid testing done on 04/12/2018 is negative. Called patient and informed her.  Also discussed with [facility] Dr.Zeidner. Patient has appointment with him on 04/18/2018 to discuss bone marrow biopsy result and further management plan.

## 2018-04-17 ENCOUNTER — Inpatient Hospital Stay: Payer: 59

## 2018-04-18 ENCOUNTER — Inpatient Hospital Stay: Payer: 59

## 2018-04-18 DIAGNOSIS — Z792 Long term (current) use of antibiotics: Secondary | ICD-10-CM | POA: Diagnosis not present

## 2018-04-18 DIAGNOSIS — Z79899 Other long term (current) drug therapy: Secondary | ICD-10-CM | POA: Diagnosis not present

## 2018-04-18 DIAGNOSIS — C92A0 Acute myeloid leukemia with multilineage dysplasia, not having achieved remission: Secondary | ICD-10-CM | POA: Diagnosis not present

## 2018-04-18 DIAGNOSIS — C9200 Acute myeloblastic leukemia, not having achieved remission: Secondary | ICD-10-CM | POA: Diagnosis not present

## 2018-04-19 ENCOUNTER — Inpatient Hospital Stay: Payer: 59

## 2018-04-22 ENCOUNTER — Inpatient Hospital Stay: Payer: 59

## 2018-04-23 ENCOUNTER — Inpatient Hospital Stay: Payer: 59

## 2018-04-24 ENCOUNTER — Inpatient Hospital Stay: Payer: 59

## 2018-04-24 DIAGNOSIS — C92A0 Acute myeloid leukemia with multilineage dysplasia, not having achieved remission: Secondary | ICD-10-CM | POA: Diagnosis not present

## 2018-04-24 DIAGNOSIS — Z5111 Encounter for antineoplastic chemotherapy: Secondary | ICD-10-CM | POA: Diagnosis not present

## 2018-04-25 ENCOUNTER — Inpatient Hospital Stay: Payer: 59

## 2018-04-25 DIAGNOSIS — C92A0 Acute myeloid leukemia with multilineage dysplasia, not having achieved remission: Secondary | ICD-10-CM | POA: Diagnosis not present

## 2018-04-26 ENCOUNTER — Inpatient Hospital Stay: Payer: 59

## 2018-04-26 DIAGNOSIS — Z5111 Encounter for antineoplastic chemotherapy: Secondary | ICD-10-CM | POA: Diagnosis not present

## 2018-04-26 DIAGNOSIS — C92A0 Acute myeloid leukemia with multilineage dysplasia, not having achieved remission: Secondary | ICD-10-CM | POA: Diagnosis not present

## 2018-04-27 DIAGNOSIS — Z5111 Encounter for antineoplastic chemotherapy: Secondary | ICD-10-CM | POA: Diagnosis not present

## 2018-04-27 DIAGNOSIS — C92A0 Acute myeloid leukemia with multilineage dysplasia, not having achieved remission: Secondary | ICD-10-CM | POA: Diagnosis not present

## 2018-04-29 DIAGNOSIS — C92A0 Acute myeloid leukemia with multilineage dysplasia, not having achieved remission: Secondary | ICD-10-CM | POA: Diagnosis not present

## 2018-04-29 DIAGNOSIS — Z5111 Encounter for antineoplastic chemotherapy: Secondary | ICD-10-CM | POA: Diagnosis not present

## 2018-04-30 ENCOUNTER — Encounter: Payer: Self-pay | Admitting: Oncology

## 2018-04-30 DIAGNOSIS — C92A0 Acute myeloid leukemia with multilineage dysplasia, not having achieved remission: Secondary | ICD-10-CM | POA: Diagnosis not present

## 2018-04-30 DIAGNOSIS — Z5111 Encounter for antineoplastic chemotherapy: Secondary | ICD-10-CM | POA: Diagnosis not present

## 2018-05-01 DIAGNOSIS — C92A0 Acute myeloid leukemia with multilineage dysplasia, not having achieved remission: Secondary | ICD-10-CM | POA: Diagnosis not present

## 2018-05-01 DIAGNOSIS — Z5111 Encounter for antineoplastic chemotherapy: Secondary | ICD-10-CM | POA: Diagnosis not present

## 2018-05-06 ENCOUNTER — Other Ambulatory Visit: Payer: Self-pay

## 2018-05-07 ENCOUNTER — Other Ambulatory Visit: Payer: Self-pay | Admitting: Oncology

## 2018-05-07 ENCOUNTER — Other Ambulatory Visit: Payer: Self-pay

## 2018-05-07 ENCOUNTER — Inpatient Hospital Stay: Payer: 59 | Attending: Oncology

## 2018-05-07 ENCOUNTER — Inpatient Hospital Stay: Payer: 59

## 2018-05-07 DIAGNOSIS — Z79899 Other long term (current) drug therapy: Secondary | ICD-10-CM | POA: Insufficient documentation

## 2018-05-07 DIAGNOSIS — D649 Anemia, unspecified: Secondary | ICD-10-CM

## 2018-05-07 DIAGNOSIS — I429 Cardiomyopathy, unspecified: Secondary | ICD-10-CM | POA: Diagnosis not present

## 2018-05-07 DIAGNOSIS — C9200 Acute myeloblastic leukemia, not having achieved remission: Secondary | ICD-10-CM | POA: Diagnosis not present

## 2018-05-07 DIAGNOSIS — Z791 Long term (current) use of non-steroidal anti-inflammatories (NSAID): Secondary | ICD-10-CM | POA: Diagnosis not present

## 2018-05-07 DIAGNOSIS — F329 Major depressive disorder, single episode, unspecified: Secondary | ICD-10-CM | POA: Diagnosis not present

## 2018-05-07 DIAGNOSIS — G4700 Insomnia, unspecified: Secondary | ICD-10-CM | POA: Insufficient documentation

## 2018-05-07 LAB — CBC WITH DIFFERENTIAL/PLATELET
Abs Immature Granulocytes: 0 10*3/uL (ref 0.00–0.07)
Basophils Absolute: 0 10*3/uL (ref 0.0–0.1)
Basophils Relative: 0 %
Eosinophils Absolute: 0 10*3/uL (ref 0.0–0.5)
Eosinophils Relative: 0 %
HCT: 18.8 % — ABNORMAL LOW (ref 36.0–46.0)
Hemoglobin: 6.5 g/dL — ABNORMAL LOW (ref 12.0–15.0)
Lymphocytes Relative: 76 %
Lymphs Abs: 1.1 10*3/uL (ref 0.7–4.0)
MCH: 29.4 pg (ref 26.0–34.0)
MCHC: 34.6 g/dL (ref 30.0–36.0)
MCV: 85.1 fL (ref 80.0–100.0)
Monocytes Absolute: 0 10*3/uL — ABNORMAL LOW (ref 0.1–1.0)
Monocytes Relative: 3 %
Neutro Abs: 0.3 10*3/uL — ABNORMAL LOW (ref 1.7–7.7)
Neutrophils Relative %: 21 %
Platelets: 90 10*3/uL — ABNORMAL LOW (ref 150–400)
RBC: 2.21 MIL/uL — ABNORMAL LOW (ref 3.87–5.11)
RDW: 12.2 % (ref 11.5–15.5)
Smear Review: NORMAL
WBC Morphology: ABNORMAL
WBC: 1.4 10*3/uL — CL (ref 4.0–10.5)
nRBC: 0 % (ref 0.0–0.2)

## 2018-05-07 LAB — COMPREHENSIVE METABOLIC PANEL
ALT: 52 U/L — ABNORMAL HIGH (ref 0–44)
AST: 38 U/L (ref 15–41)
Albumin: 3.9 g/dL (ref 3.5–5.0)
Alkaline Phosphatase: 81 U/L (ref 38–126)
Anion gap: 9 (ref 5–15)
BUN: 18 mg/dL (ref 6–20)
CO2: 23 mmol/L (ref 22–32)
Calcium: 9 mg/dL (ref 8.9–10.3)
Chloride: 106 mmol/L (ref 98–111)
Creatinine, Ser: 1.3 mg/dL — ABNORMAL HIGH (ref 0.44–1.00)
GFR calc Af Amer: 57 mL/min — ABNORMAL LOW (ref >60)
GFR calc non Af Amer: 49 mL/min — ABNORMAL LOW (ref >60)
Glucose, Bld: 117 mg/dL — ABNORMAL HIGH (ref 70–99)
Potassium: 3.9 mmol/L (ref 3.5–5.1)
Sodium: 138 mmol/L (ref 135–145)
Total Bilirubin: 0.8 mg/dL (ref 0.3–1.2)
Total Protein: 7.6 g/dL (ref 6.5–8.1)

## 2018-05-07 LAB — PREPARE RBC (CROSSMATCH)

## 2018-05-07 LAB — SAMPLE TO BLOOD BANK

## 2018-05-07 MED ORDER — HEPARIN SOD (PORK) LOCK FLUSH 100 UNIT/ML IV SOLN
500.0000 [IU] | Freq: Once | INTRAVENOUS | Status: AC
  Administered 2018-05-07: 500 [IU] via INTRAVENOUS
  Filled 2018-05-07: qty 5

## 2018-05-07 MED ORDER — HEPARIN SOD (PORK) LOCK FLUSH 100 UNIT/ML IV SOLN: 500.0000 [IU] | Freq: Every day | INTRAVENOUS | Status: DC | PRN

## 2018-05-07 MED ORDER — DIPHENHYDRAMINE HCL 25 MG PO CAPS
25.0000 mg | ORAL_CAPSULE | Freq: Once | ORAL | Status: AC
  Administered 2018-05-07: 11:00:00 25 mg via ORAL
  Filled 2018-05-07: qty 1

## 2018-05-07 MED ORDER — SODIUM CHLORIDE 0.9% IV SOLUTION
250.0000 mL | Freq: Once | INTRAVENOUS | Status: AC
  Administered 2018-05-07: 250 mL via INTRAVENOUS
  Filled 2018-05-07: qty 250

## 2018-05-07 MED ORDER — ACETAMINOPHEN 325 MG PO TABS
650.0000 mg | ORAL_TABLET | Freq: Once | ORAL | Status: AC
  Administered 2018-05-07: 11:00:00 650 mg via ORAL
  Filled 2018-05-07: qty 2

## 2018-05-07 MED ORDER — SODIUM CHLORIDE 0.9% FLUSH
10.0000 mL | INTRAVENOUS | Status: DC | PRN
  Administered 2018-05-07: 10:00:00 10 mL via INTRAVENOUS
  Filled 2018-05-07: qty 10

## 2018-05-09 ENCOUNTER — Other Ambulatory Visit: Payer: Self-pay | Admitting: Oncology

## 2018-05-09 ENCOUNTER — Inpatient Hospital Stay (HOSPITAL_BASED_OUTPATIENT_CLINIC_OR_DEPARTMENT_OTHER): Payer: 59 | Admitting: Oncology

## 2018-05-09 ENCOUNTER — Inpatient Hospital Stay: Payer: 59

## 2018-05-09 ENCOUNTER — Other Ambulatory Visit: Payer: Self-pay

## 2018-05-09 ENCOUNTER — Encounter: Payer: Self-pay | Admitting: Oncology

## 2018-05-09 DIAGNOSIS — C9200 Acute myeloblastic leukemia, not having achieved remission: Secondary | ICD-10-CM | POA: Diagnosis not present

## 2018-05-09 DIAGNOSIS — Z95828 Presence of other vascular implants and grafts: Secondary | ICD-10-CM

## 2018-05-09 DIAGNOSIS — D649 Anemia, unspecified: Secondary | ICD-10-CM

## 2018-05-09 DIAGNOSIS — G4700 Insomnia, unspecified: Secondary | ICD-10-CM

## 2018-05-09 DIAGNOSIS — I429 Cardiomyopathy, unspecified: Secondary | ICD-10-CM

## 2018-05-09 DIAGNOSIS — F329 Major depressive disorder, single episode, unspecified: Secondary | ICD-10-CM

## 2018-05-09 DIAGNOSIS — Z791 Long term (current) use of non-steroidal anti-inflammatories (NSAID): Secondary | ICD-10-CM

## 2018-05-09 DIAGNOSIS — Z79899 Other long term (current) drug therapy: Secondary | ICD-10-CM

## 2018-05-09 DIAGNOSIS — D696 Thrombocytopenia, unspecified: Secondary | ICD-10-CM

## 2018-05-09 LAB — CBC WITH DIFFERENTIAL/PLATELET
Abs Immature Granulocytes: 0 10*3/uL (ref 0.00–0.07)
Basophils Absolute: 0 10*3/uL (ref 0.0–0.1)
Basophils Relative: 0 %
Eosinophils Absolute: 0 10*3/uL (ref 0.0–0.5)
Eosinophils Relative: 0 %
HCT: 20.8 % — ABNORMAL LOW (ref 36.0–46.0)
Hemoglobin: 7.4 g/dL — ABNORMAL LOW (ref 12.0–15.0)
Immature Granulocytes: 0 %
Lymphocytes Relative: 67 %
Lymphs Abs: 0.8 10*3/uL (ref 0.7–4.0)
MCH: 28.8 pg (ref 26.0–34.0)
MCHC: 35.6 g/dL (ref 30.0–36.0)
MCV: 80.9 fL (ref 80.0–100.0)
Monocytes Absolute: 0.2 10*3/uL (ref 0.1–1.0)
Monocytes Relative: 16 %
Neutro Abs: 0.2 10*3/uL — ABNORMAL LOW (ref 1.7–7.7)
Neutrophils Relative %: 17 %
Platelets: 66 10*3/uL — ABNORMAL LOW (ref 150–400)
RBC: 2.57 MIL/uL — ABNORMAL LOW (ref 3.87–5.11)
RDW: 14 % (ref 11.5–15.5)
Smear Review: NORMAL
WBC Morphology: ABNORMAL
WBC: 1.2 10*3/uL — CL (ref 4.0–10.5)
nRBC: 0 % (ref 0.0–0.2)

## 2018-05-09 LAB — COMPREHENSIVE METABOLIC PANEL
ALT: 61 U/L — ABNORMAL HIGH (ref 0–44)
AST: 39 U/L (ref 15–41)
Albumin: 3.9 g/dL (ref 3.5–5.0)
Alkaline Phosphatase: 93 U/L (ref 38–126)
Anion gap: 9 (ref 5–15)
BUN: 18 mg/dL (ref 6–20)
CO2: 23 mmol/L (ref 22–32)
Calcium: 9.1 mg/dL (ref 8.9–10.3)
Chloride: 106 mmol/L (ref 98–111)
Creatinine, Ser: 1.12 mg/dL — ABNORMAL HIGH (ref 0.44–1.00)
GFR calc Af Amer: >60 mL/min (ref >60)
GFR calc non Af Amer: 58 mL/min — ABNORMAL LOW (ref >60)
Glucose, Bld: 128 mg/dL — ABNORMAL HIGH (ref 70–99)
Potassium: 3.9 mmol/L (ref 3.5–5.1)
Sodium: 138 mmol/L (ref 135–145)
Total Bilirubin: 0.9 mg/dL (ref 0.3–1.2)
Total Protein: 7.7 g/dL (ref 6.5–8.1)

## 2018-05-09 LAB — PREPARE RBC (CROSSMATCH)

## 2018-05-09 MED ORDER — ACETAMINOPHEN 325 MG PO TABS
650.0000 mg | ORAL_TABLET | Freq: Once | ORAL | Status: AC
  Administered 2018-05-09: 650 mg via ORAL
  Filled 2018-05-09: qty 2

## 2018-05-09 MED ORDER — DIPHENHYDRAMINE HCL 25 MG PO CAPS
25.0000 mg | ORAL_CAPSULE | Freq: Once | ORAL | Status: AC
  Administered 2018-05-09: 25 mg via ORAL
  Filled 2018-05-09: qty 1

## 2018-05-09 MED ORDER — SODIUM CHLORIDE 0.9% IV SOLUTION
250.0000 mL | Freq: Once | INTRAVENOUS | Status: AC
  Administered 2018-05-09: 250 mL via INTRAVENOUS
  Filled 2018-05-09: qty 250

## 2018-05-09 MED ORDER — SODIUM CHLORIDE 0.9% FLUSH
10.0000 mL | Freq: Once | INTRAVENOUS | Status: AC
  Administered 2018-05-09: 09:00:00 10 mL via INTRAVENOUS
  Filled 2018-05-09: qty 10

## 2018-05-09 MED ORDER — HEPARIN SOD (PORK) LOCK FLUSH 100 UNIT/ML IV SOLN
250.0000 [IU] | INTRAVENOUS | Status: AC | PRN
  Administered 2018-05-09: 250 [IU]
  Filled 2018-05-09: qty 5
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
## 2018-05-09 NOTE — Progress Notes (Signed)
Patient here for follow up. Feels tired today stating "I feel like I need blood."

## 2018-05-10 LAB — BPAM RBC
Blood Product Expiration Date: 202004222359
Blood Product Expiration Date: 202004242359
ISSUE DATE / TIME: 202004211140
ISSUE DATE / TIME: 202004231211
Unit Type and Rh: 5100
Unit Type and Rh: 5100

## 2018-05-10 LAB — TYPE AND SCREEN
ABO/RH(D): O POS
Antibody Screen: NEGATIVE
Unit division: 0
Unit division: 0

## 2018-05-13 ENCOUNTER — Other Ambulatory Visit: Payer: Self-pay

## 2018-05-14 ENCOUNTER — Inpatient Hospital Stay: Payer: 59

## 2018-05-14 ENCOUNTER — Other Ambulatory Visit: Payer: Self-pay

## 2018-05-14 DIAGNOSIS — T451X5A Adverse effect of antineoplastic and immunosuppressive drugs, initial encounter: Principal | ICD-10-CM

## 2018-05-14 DIAGNOSIS — C9200 Acute myeloblastic leukemia, not having achieved remission: Secondary | ICD-10-CM

## 2018-05-14 DIAGNOSIS — D6481 Anemia due to antineoplastic chemotherapy: Secondary | ICD-10-CM

## 2018-05-14 DIAGNOSIS — Z95828 Presence of other vascular implants and grafts: Secondary | ICD-10-CM

## 2018-05-14 LAB — COMPREHENSIVE METABOLIC PANEL
ALT: 41 U/L (ref 0–44)
AST: 26 U/L (ref 15–41)
Albumin: 3.6 g/dL (ref 3.5–5.0)
Alkaline Phosphatase: 84 U/L (ref 38–126)
Anion gap: 8 (ref 5–15)
BUN: 18 mg/dL (ref 6–20)
CO2: 24 mmol/L (ref 22–32)
Calcium: 9.1 mg/dL (ref 8.9–10.3)
Chloride: 107 mmol/L (ref 98–111)
Creatinine, Ser: 1.23 mg/dL — ABNORMAL HIGH (ref 0.44–1.00)
GFR calc Af Amer: >60 mL/min (ref >60)
GFR calc non Af Amer: 52 mL/min — ABNORMAL LOW (ref >60)
Glucose, Bld: 136 mg/dL — ABNORMAL HIGH (ref 70–99)
Potassium: 3.9 mmol/L (ref 3.5–5.1)
Sodium: 139 mmol/L (ref 135–145)
Total Bilirubin: 0.8 mg/dL (ref 0.3–1.2)
Total Protein: 7.3 g/dL (ref 6.5–8.1)

## 2018-05-14 LAB — CBC WITH DIFFERENTIAL/PLATELET
Abs Immature Granulocytes: 0 10*3/uL (ref 0.00–0.07)
Basophils Absolute: 0 10*3/uL (ref 0.0–0.1)
Basophils Relative: 0 %
Eosinophils Absolute: 0 10*3/uL (ref 0.0–0.5)
Eosinophils Relative: 0 %
HCT: 22.1 % — ABNORMAL LOW (ref 36.0–46.0)
Hemoglobin: 7.5 g/dL — ABNORMAL LOW (ref 12.0–15.0)
Immature Granulocytes: 0 %
Lymphocytes Relative: 80 %
Lymphs Abs: 0.8 10*3/uL (ref 0.7–4.0)
MCH: 27.9 pg (ref 26.0–34.0)
MCHC: 33.9 g/dL (ref 30.0–36.0)
MCV: 82.2 fL (ref 80.0–100.0)
Monocytes Absolute: 0.1 10*3/uL (ref 0.1–1.0)
Monocytes Relative: 14 %
Neutro Abs: 0.1 10*3/uL — ABNORMAL LOW (ref 1.7–7.7)
Neutrophils Relative %: 6 %
Platelets: 43 10*3/uL — ABNORMAL LOW (ref 150–400)
RBC: 2.69 MIL/uL — ABNORMAL LOW (ref 3.87–5.11)
RDW: 12.6 % (ref 11.5–15.5)
Smear Review: DECREASED
WBC: 0.9 10*3/uL — CL (ref 4.0–10.5)
nRBC: 0 % (ref 0.0–0.2)

## 2018-05-14 LAB — SAMPLE TO BLOOD BANK

## 2018-05-14 MED ORDER — HEPARIN SOD (PORK) LOCK FLUSH 100 UNIT/ML IV SOLN
500.0000 [IU] | Freq: Once | INTRAVENOUS | Status: AC
  Administered 2018-05-14: 500 [IU] via INTRAVENOUS

## 2018-05-14 MED ORDER — SODIUM CHLORIDE 0.9% FLUSH
10.0000 mL | Freq: Once | INTRAVENOUS | Status: AC
  Administered 2018-05-14: 10 mL via INTRAVENOUS
  Filled 2018-05-14: qty 10

## 2018-05-14 NOTE — Progress Notes (Unsigned)
Per Dr. Tasia Catchings, no blood transfusion needed at this time. Pt stable at discharge.

## 2018-05-16 ENCOUNTER — Other Ambulatory Visit: Payer: Self-pay

## 2018-05-17 ENCOUNTER — Inpatient Hospital Stay: Payer: 59 | Attending: Oncology

## 2018-05-17 ENCOUNTER — Inpatient Hospital Stay: Payer: 59

## 2018-05-17 ENCOUNTER — Other Ambulatory Visit: Payer: Self-pay | Admitting: Oncology

## 2018-05-17 DIAGNOSIS — K1231 Oral mucositis (ulcerative) due to antineoplastic therapy: Secondary | ICD-10-CM | POA: Diagnosis not present

## 2018-05-17 DIAGNOSIS — C9200 Acute myeloblastic leukemia, not having achieved remission: Secondary | ICD-10-CM

## 2018-05-17 DIAGNOSIS — E876 Hypokalemia: Secondary | ICD-10-CM | POA: Insufficient documentation

## 2018-05-17 DIAGNOSIS — D649 Anemia, unspecified: Secondary | ICD-10-CM

## 2018-05-17 DIAGNOSIS — I427 Cardiomyopathy due to drug and external agent: Secondary | ICD-10-CM | POA: Insufficient documentation

## 2018-05-17 DIAGNOSIS — D61818 Other pancytopenia: Secondary | ICD-10-CM | POA: Insufficient documentation

## 2018-05-17 DIAGNOSIS — Z5111 Encounter for antineoplastic chemotherapy: Secondary | ICD-10-CM | POA: Diagnosis not present

## 2018-05-17 DIAGNOSIS — Z79899 Other long term (current) drug therapy: Secondary | ICD-10-CM | POA: Diagnosis not present

## 2018-05-17 DIAGNOSIS — Z95828 Presence of other vascular implants and grafts: Secondary | ICD-10-CM

## 2018-05-17 LAB — CBC WITH DIFFERENTIAL/PLATELET
Abs Immature Granulocytes: 0 10*3/uL (ref 0.00–0.07)
Basophils Absolute: 0 10*3/uL (ref 0.0–0.1)
Basophils Relative: 0 %
Eosinophils Absolute: 0 10*3/uL (ref 0.0–0.5)
Eosinophils Relative: 0 %
HCT: 19.1 % — ABNORMAL LOW (ref 36.0–46.0)
Hemoglobin: 6.7 g/dL — ABNORMAL LOW (ref 12.0–15.0)
Immature Granulocytes: 0 %
Lymphocytes Relative: 76 %
Lymphs Abs: 0.6 10*3/uL — ABNORMAL LOW (ref 0.7–4.0)
MCH: 28.8 pg (ref 26.0–34.0)
MCHC: 35.1 g/dL (ref 30.0–36.0)
MCV: 82 fL (ref 80.0–100.0)
Monocytes Absolute: 0.2 10*3/uL (ref 0.1–1.0)
Monocytes Relative: 21 %
Neutro Abs: 0 10*3/uL — ABNORMAL LOW (ref 1.7–7.7)
Neutrophils Relative %: 3 %
Platelets: 46 10*3/uL — ABNORMAL LOW (ref 150–400)
RBC: 2.33 MIL/uL — ABNORMAL LOW (ref 3.87–5.11)
RDW: 12.2 % (ref 11.5–15.5)
Smear Review: DECREASED
WBC: 0.8 10*3/uL — CL (ref 4.0–10.5)
nRBC: 0 % (ref 0.0–0.2)

## 2018-05-17 LAB — COMPREHENSIVE METABOLIC PANEL
ALT: 32 U/L (ref 0–44)
AST: 23 U/L (ref 15–41)
Albumin: 3.6 g/dL (ref 3.5–5.0)
Alkaline Phosphatase: 90 U/L (ref 38–126)
Anion gap: 9 (ref 5–15)
BUN: 18 mg/dL (ref 6–20)
CO2: 22 mmol/L (ref 22–32)
Calcium: 8.8 mg/dL — ABNORMAL LOW (ref 8.9–10.3)
Chloride: 106 mmol/L (ref 98–111)
Creatinine, Ser: 1.14 mg/dL — ABNORMAL HIGH (ref 0.44–1.00)
GFR calc Af Amer: >60 mL/min (ref >60)
GFR calc non Af Amer: 57 mL/min — ABNORMAL LOW (ref >60)
Glucose, Bld: 169 mg/dL — ABNORMAL HIGH (ref 70–99)
Potassium: 3.6 mmol/L (ref 3.5–5.1)
Sodium: 137 mmol/L (ref 135–145)
Total Bilirubin: 0.8 mg/dL (ref 0.3–1.2)
Total Protein: 6.8 g/dL (ref 6.5–8.1)

## 2018-05-17 LAB — SAMPLE TO BLOOD BANK

## 2018-05-17 LAB — PREPARE RBC (CROSSMATCH)

## 2018-05-17 MED ORDER — HEPARIN SOD (PORK) LOCK FLUSH 100 UNIT/ML IV SOLN
250.0000 [IU] | INTRAVENOUS | Status: AC | PRN
  Administered 2018-05-17: 250 [IU]
  Filled 2018-05-17: qty 5

## 2018-05-17 MED ORDER — ACETAMINOPHEN 325 MG PO TABS
650.0000 mg | ORAL_TABLET | Freq: Once | ORAL | Status: AC
  Administered 2018-05-17: 12:00:00 650 mg via ORAL
  Filled 2018-05-17: qty 2

## 2018-05-17 MED ORDER — DIPHENHYDRAMINE HCL 25 MG PO CAPS
25.0000 mg | ORAL_CAPSULE | Freq: Once | ORAL | Status: AC
  Administered 2018-05-17: 12:00:00 25 mg via ORAL
  Filled 2018-05-17: qty 1

## 2018-05-17 MED ORDER — SODIUM CHLORIDE 0.9% FLUSH
10.0000 mL | Freq: Once | INTRAVENOUS | Status: AC
  Administered 2018-05-17: 09:00:00 10 mL via INTRAVENOUS
  Filled 2018-05-17: qty 10

## 2018-05-17 MED ORDER — SODIUM CHLORIDE 0.9% IV SOLUTION
250.0000 mL | Freq: Once | INTRAVENOUS | Status: AC
  Administered 2018-05-17: 12:00:00 250 mL via INTRAVENOUS
  Filled 2018-05-17: qty 250

## 2018-05-18 LAB — TYPE AND SCREEN
ABO/RH(D): O POS
Antibody Screen: NEGATIVE
Unit division: 0

## 2018-05-18 LAB — BPAM RBC
Blood Product Expiration Date: 202005132359
ISSUE DATE / TIME: 202005011147
Unit Type and Rh: 5100

## 2018-05-23 ENCOUNTER — Other Ambulatory Visit: Payer: Self-pay | Admitting: Oncology

## 2018-05-23 DIAGNOSIS — C9200 Acute myeloblastic leukemia, not having achieved remission: Secondary | ICD-10-CM

## 2018-05-23 MED ORDER — ONDANSETRON HCL 8 MG PO TABS: 8.0000 mg | ORAL_TABLET | Freq: Two times a day (BID) | ORAL | 1 refills | Status: DC | PRN

## 2018-05-23 MED ORDER — PROCHLORPERAZINE MALEATE 10 MG PO TABS: 10.0000 mg | ORAL_TABLET | Freq: Four times a day (QID) | ORAL | 1 refills | Status: DC | PRN

## 2018-05-23 MED ORDER — LIDOCAINE-PRILOCAINE 2.5-2.5 % EX CREA: TOPICAL_CREAM | CUTANEOUS | 3 refills | Status: AC

## 2018-05-23 NOTE — Progress Notes (Signed)
DISCONTINUE OFF PATHWAY REGIMEN - Other Dx   [id number]:Decitabine:   A cycle is every 28 days:     Decitabine   **Always confirm dose/schedule in your pharmacy ordering system**  REASON: Other Reason PRIOR TREATMENT: Decitabine TREATMENT RESPONSE: Progressive Disease (PD)  START OFF PATHWAY REGIMEN - Other   [id number]:Azacitidine 75 mg/m2 subcut D1-7 q28 days:   A cycle is every 28 days:     Azacitidine   **Always confirm dose/schedule in your pharmacy ordering system**  Patient Characteristics: Intent of Therapy: Non-Curative / Palliative Intent, Discussed with Patient

## 2018-05-24 ENCOUNTER — Inpatient Hospital Stay: Payer: 59

## 2018-05-24 ENCOUNTER — Other Ambulatory Visit: Payer: Self-pay

## 2018-05-24 ENCOUNTER — Other Ambulatory Visit: Payer: Self-pay | Admitting: Oncology

## 2018-05-24 ENCOUNTER — Inpatient Hospital Stay (HOSPITAL_BASED_OUTPATIENT_CLINIC_OR_DEPARTMENT_OTHER): Payer: 59 | Admitting: Oncology

## 2018-05-24 ENCOUNTER — Encounter: Payer: Self-pay | Admitting: Oncology

## 2018-05-24 VITALS — BP 142/84 | HR 67 | Temp 98.2°F | Resp 18 | Wt 218.7 lb

## 2018-05-24 DIAGNOSIS — I427 Cardiomyopathy due to drug and external agent: Secondary | ICD-10-CM

## 2018-05-24 DIAGNOSIS — C9200 Acute myeloblastic leukemia, not having achieved remission: Secondary | ICD-10-CM | POA: Diagnosis not present

## 2018-05-24 DIAGNOSIS — Z79899 Other long term (current) drug therapy: Secondary | ICD-10-CM

## 2018-05-24 DIAGNOSIS — D6481 Anemia due to antineoplastic chemotherapy: Secondary | ICD-10-CM

## 2018-05-24 DIAGNOSIS — D649 Anemia, unspecified: Secondary | ICD-10-CM

## 2018-05-24 DIAGNOSIS — Z5111 Encounter for antineoplastic chemotherapy: Secondary | ICD-10-CM | POA: Diagnosis not present

## 2018-05-24 DIAGNOSIS — D61818 Other pancytopenia: Secondary | ICD-10-CM | POA: Diagnosis not present

## 2018-05-24 DIAGNOSIS — Z452 Encounter for adjustment and management of vascular access device: Secondary | ICD-10-CM

## 2018-05-24 DIAGNOSIS — T451X5A Adverse effect of antineoplastic and immunosuppressive drugs, initial encounter: Secondary | ICD-10-CM

## 2018-05-24 DIAGNOSIS — D696 Thrombocytopenia, unspecified: Secondary | ICD-10-CM

## 2018-05-24 LAB — BASIC METABOLIC PANEL
Anion gap: 7 (ref 5–15)
BUN: 17 mg/dL (ref 6–20)
CO2: 24 mmol/L (ref 22–32)
Calcium: 9 mg/dL (ref 8.9–10.3)
Chloride: 108 mmol/L (ref 98–111)
Creatinine, Ser: 1.17 mg/dL — ABNORMAL HIGH (ref 0.44–1.00)
GFR calc Af Amer: >60 mL/min (ref >60)
GFR calc non Af Amer: 55 mL/min — ABNORMAL LOW (ref >60)
Glucose, Bld: 142 mg/dL — ABNORMAL HIGH (ref 70–99)
Potassium: 3.6 mmol/L (ref 3.5–5.1)
Sodium: 139 mmol/L (ref 135–145)

## 2018-05-24 LAB — CBC WITH DIFFERENTIAL/PLATELET
Abs Immature Granulocytes: 0 10*3/uL (ref 0.00–0.07)
Basophils Absolute: 0 10*3/uL (ref 0.0–0.1)
Basophils Relative: 0 %
Eosinophils Absolute: 0 10*3/uL (ref 0.0–0.5)
Eosinophils Relative: 0 %
HCT: 19.9 % — ABNORMAL LOW (ref 36.0–46.0)
Hemoglobin: 6.9 g/dL — ABNORMAL LOW (ref 12.0–15.0)
Immature Granulocytes: 0 %
Lymphocytes Relative: 68 %
Lymphs Abs: 0.8 10*3/uL (ref 0.7–4.0)
MCH: 28.4 pg (ref 26.0–34.0)
MCHC: 34.7 g/dL (ref 30.0–36.0)
MCV: 81.9 fL (ref 80.0–100.0)
Monocytes Absolute: 0.4 10*3/uL (ref 0.1–1.0)
Monocytes Relative: 30 %
Neutro Abs: 0 10*3/uL — ABNORMAL LOW (ref 1.7–7.7)
Neutrophils Relative %: 2 %
Platelets: 54 10*3/uL — ABNORMAL LOW (ref 150–400)
RBC: 2.43 MIL/uL — ABNORMAL LOW (ref 3.87–5.11)
RDW: 12.6 % (ref 11.5–15.5)
Smear Review: NORMAL
WBC: 1.2 10*3/uL — CL (ref 4.0–10.5)
nRBC: 0 % (ref 0.0–0.2)

## 2018-05-24 LAB — PREPARE RBC (CROSSMATCH)

## 2018-05-24 MED ORDER — SODIUM CHLORIDE 0.9% IV SOLUTION
250.0000 mL | Freq: Once | INTRAVENOUS | Status: AC
  Administered 2018-05-24: 12:00:00 250 mL via INTRAVENOUS
  Filled 2018-05-24: qty 250

## 2018-05-24 MED ORDER — DIPHENHYDRAMINE HCL 25 MG PO CAPS
25.0000 mg | ORAL_CAPSULE | Freq: Once | ORAL | Status: AC
  Administered 2018-05-24: 25 mg via ORAL
  Filled 2018-05-24: qty 1

## 2018-05-24 MED ORDER — HEPARIN SOD (PORK) LOCK FLUSH 100 UNIT/ML IV SOLN
250.0000 [IU] | INTRAVENOUS | Status: AC | PRN
  Administered 2018-05-24: 250 [IU]
  Filled 2018-05-24 (×2): qty 5

## 2018-05-24 MED ORDER — SODIUM CHLORIDE 0.9% FLUSH
10.0000 mL | INTRAVENOUS | Status: AC | PRN
  Administered 2018-05-24: 10 mL
  Filled 2018-05-24: qty 10

## 2018-05-24 MED ORDER — SODIUM CHLORIDE 0.9% FLUSH
10.0000 mL | Freq: Once | INTRAVENOUS | Status: AC
  Administered 2018-05-24: 10 mL via INTRAVENOUS
  Filled 2018-05-24: qty 10

## 2018-05-24 MED ORDER — ACETAMINOPHEN 325 MG PO TABS
650.0000 mg | ORAL_TABLET | Freq: Once | ORAL | Status: AC
  Administered 2018-05-24: 650 mg via ORAL
  Filled 2018-05-24: qty 2

## 2018-05-24 NOTE — Progress Notes (Signed)
Patient here for follow up. No concerns voiced.  °

## 2018-05-25 LAB — BPAM RBC
Blood Product Expiration Date: 202005192359
ISSUE DATE / TIME: 202005081141
Unit Type and Rh: 5100

## 2018-05-25 LAB — TYPE AND SCREEN
ABO/RH(D): O POS
Antibody Screen: NEGATIVE
Unit division: 0
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
## 2018-05-27 ENCOUNTER — Inpatient Hospital Stay: Payer: 59

## 2018-05-27 ENCOUNTER — Encounter: Payer: Self-pay | Admitting: Oncology

## 2018-05-27 ENCOUNTER — Other Ambulatory Visit: Payer: Self-pay

## 2018-05-27 VITALS — BP 152/90 | HR 69 | Temp 99.1°F | Resp 17 | Wt 220.0 lb

## 2018-05-27 DIAGNOSIS — Z5111 Encounter for antineoplastic chemotherapy: Secondary | ICD-10-CM | POA: Diagnosis not present

## 2018-05-27 DIAGNOSIS — C9200 Acute myeloblastic leukemia, not having achieved remission: Secondary | ICD-10-CM

## 2018-05-27 LAB — CBC WITH DIFFERENTIAL/PLATELET
Abs Immature Granulocytes: 0 10*3/uL (ref 0.00–0.07)
Basophils Absolute: 0 10*3/uL (ref 0.0–0.1)
Basophils Relative: 0 %
Eosinophils Absolute: 0 10*3/uL (ref 0.0–0.5)
Eosinophils Relative: 0 %
HCT: 22.1 % — ABNORMAL LOW (ref 36.0–46.0)
Hemoglobin: 7.7 g/dL — ABNORMAL LOW (ref 12.0–15.0)
Immature Granulocytes: 0 %
Lymphocytes Relative: 65 %
Lymphs Abs: 0.7 10*3/uL (ref 0.7–4.0)
MCH: 28.7 pg (ref 26.0–34.0)
MCHC: 34.8 g/dL (ref 30.0–36.0)
MCV: 82.5 fL (ref 80.0–100.0)
Monocytes Absolute: 0.3 10*3/uL (ref 0.1–1.0)
Monocytes Relative: 32 %
Neutro Abs: 0 10*3/uL — ABNORMAL LOW (ref 1.7–7.7)
Neutrophils Relative %: 3 %
Platelets: 83 10*3/uL — ABNORMAL LOW (ref 150–400)
RBC: 2.68 MIL/uL — ABNORMAL LOW (ref 3.87–5.11)
RDW: 12.5 % (ref 11.5–15.5)
Smear Review: NORMAL
WBC: 1 10*3/uL — CL (ref 4.0–10.5)
nRBC: 0 % (ref 0.0–0.2)

## 2018-05-27 LAB — BASIC METABOLIC PANEL
Anion gap: 6 (ref 5–15)
BUN: 12 mg/dL (ref 6–20)
CO2: 26 mmol/L (ref 22–32)
Calcium: 9 mg/dL (ref 8.9–10.3)
Chloride: 107 mmol/L (ref 98–111)
Creatinine, Ser: 1.12 mg/dL — ABNORMAL HIGH (ref 0.44–1.00)
GFR calc Af Amer: >60 mL/min (ref >60)
GFR calc non Af Amer: 58 mL/min — ABNORMAL LOW (ref >60)
Glucose, Bld: 97 mg/dL (ref 70–99)
Potassium: 3.7 mmol/L (ref 3.5–5.1)
Sodium: 139 mmol/L (ref 135–145)

## 2018-05-27 MED ORDER — HEPARIN SOD (PORK) LOCK FLUSH 100 UNIT/ML IV SOLN
250.0000 [IU] | Freq: Once | INTRAVENOUS | Status: AC
  Administered 2018-05-27: 250 [IU] via INTRAVENOUS
  Filled 2018-05-27: qty 5

## 2018-05-27 MED ORDER — SODIUM CHLORIDE 0.9% FLUSH
10.0000 mL | Freq: Once | INTRAVENOUS | Status: AC
  Administered 2018-05-27: 09:00:00 10 mL via INTRAVENOUS
  Filled 2018-05-27: qty 10

## 2018-05-27 MED ORDER — ONDANSETRON HCL 4 MG PO TABS
8.0000 mg | ORAL_TABLET | Freq: Once | ORAL | Status: AC
  Administered 2018-05-27: 8 mg via ORAL
  Filled 2018-05-27: qty 2

## 2018-05-27 MED ORDER — AZACITIDINE CHEMO SQ INJECTION
75.0000 mg/m2 | Freq: Once | INTRAMUSCULAR | Status: AC
  Administered 2018-05-27: 157.5 mg via SUBCUTANEOUS
  Filled 2018-05-27: qty 6.3

## 2018-05-27 NOTE — Progress Notes (Signed)
MD made aware of pt.'s lab results, Hgb 7.7 and PLTs 83. Orders to proceed with chemo today and no blood products needed today. Pt updated and all questions answered at this time.   Rhonda Kane [employer]

## 2018-05-27 NOTE — Progress Notes (Signed)
Labs reviewed with MD, ok to proceed with treatment today.

## 2018-05-28 ENCOUNTER — Inpatient Hospital Stay: Payer: 59

## 2018-05-28 ENCOUNTER — Other Ambulatory Visit: Payer: Self-pay | Admitting: Oncology

## 2018-05-28 ENCOUNTER — Other Ambulatory Visit: Payer: Self-pay

## 2018-05-28 VITALS — BP 143/82 | HR 73 | Resp 18

## 2018-05-28 DIAGNOSIS — C9200 Acute myeloblastic leukemia, not having achieved remission: Secondary | ICD-10-CM

## 2018-05-28 DIAGNOSIS — Z5111 Encounter for antineoplastic chemotherapy: Secondary | ICD-10-CM | POA: Diagnosis not present

## 2018-05-28 MED ORDER — AZACITIDINE CHEMO SQ INJECTION
75.0000 mg/m2 | Freq: Once | INTRAMUSCULAR | Status: AC
  Administered 2018-05-28: 157.5 mg via SUBCUTANEOUS
  Filled 2018-05-28: qty 6.3

## 2018-05-28 MED ORDER — CYCLOBENZAPRINE HCL 5 MG PO TABS: 5.0000 mg | ORAL_TABLET | Freq: Every day | ORAL | 0 refills | Status: AC | PRN

## 2018-05-28 MED ORDER — ONDANSETRON HCL 4 MG PO TABS
8.0000 mg | ORAL_TABLET | Freq: Once | ORAL | Status: AC
  Administered 2018-05-28: 8 mg via ORAL
  Filled 2018-05-28: qty 2

## 2018-05-29 ENCOUNTER — Inpatient Hospital Stay: Payer: 59

## 2018-05-29 ENCOUNTER — Other Ambulatory Visit: Payer: Self-pay

## 2018-05-29 VITALS — BP 141/85 | HR 69 | Temp 98.1°F | Resp 20

## 2018-05-29 DIAGNOSIS — Z5111 Encounter for antineoplastic chemotherapy: Secondary | ICD-10-CM | POA: Diagnosis not present

## 2018-05-29 DIAGNOSIS — C9200 Acute myeloblastic leukemia, not having achieved remission: Secondary | ICD-10-CM

## 2018-05-29 MED ORDER — ONDANSETRON HCL 4 MG PO TABS
8.0000 mg | ORAL_TABLET | Freq: Once | ORAL | Status: AC
  Administered 2018-05-29: 8 mg via ORAL
  Filled 2018-05-29: qty 2

## 2018-05-29 MED ORDER — AZACITIDINE CHEMO SQ INJECTION
75.0000 mg/m2 | Freq: Once | INTRAMUSCULAR | Status: AC
  Administered 2018-05-29: 157.5 mg via SUBCUTANEOUS
  Filled 2018-05-29: qty 6.3

## 2018-05-30 ENCOUNTER — Inpatient Hospital Stay: Payer: 59

## 2018-05-30 ENCOUNTER — Other Ambulatory Visit: Payer: Self-pay

## 2018-05-30 VITALS — BP 131/80 | HR 76 | Temp 98.2°F | Resp 20

## 2018-05-30 DIAGNOSIS — C9200 Acute myeloblastic leukemia, not having achieved remission: Secondary | ICD-10-CM

## 2018-05-30 DIAGNOSIS — Z5111 Encounter for antineoplastic chemotherapy: Secondary | ICD-10-CM | POA: Diagnosis not present

## 2018-05-30 MED ORDER — AZACITIDINE CHEMO SQ INJECTION
75.0000 mg/m2 | Freq: Once | INTRAMUSCULAR | Status: AC
  Administered 2018-05-30: 157.5 mg via SUBCUTANEOUS
  Filled 2018-05-30: qty 6.3

## 2018-05-30 MED ORDER — ONDANSETRON HCL 4 MG PO TABS
8.0000 mg | ORAL_TABLET | Freq: Once | ORAL | Status: AC
  Administered 2018-05-30: 8 mg via ORAL
  Filled 2018-05-30: qty 2

## 2018-05-31 ENCOUNTER — Inpatient Hospital Stay: Payer: 59

## 2018-05-31 ENCOUNTER — Other Ambulatory Visit: Payer: Self-pay | Admitting: Oncology

## 2018-05-31 ENCOUNTER — Other Ambulatory Visit: Payer: Self-pay

## 2018-05-31 VITALS — BP 122/78 | HR 75 | Temp 96.5°F | Resp 20

## 2018-05-31 DIAGNOSIS — C9200 Acute myeloblastic leukemia, not having achieved remission: Secondary | ICD-10-CM

## 2018-05-31 DIAGNOSIS — Z5111 Encounter for antineoplastic chemotherapy: Secondary | ICD-10-CM | POA: Diagnosis not present

## 2018-05-31 DIAGNOSIS — D649 Anemia, unspecified: Secondary | ICD-10-CM

## 2018-05-31 DIAGNOSIS — Z95828 Presence of other vascular implants and grafts: Secondary | ICD-10-CM

## 2018-05-31 DIAGNOSIS — D696 Thrombocytopenia, unspecified: Secondary | ICD-10-CM

## 2018-05-31 LAB — CBC WITH DIFFERENTIAL/PLATELET
Abs Immature Granulocytes: 0 10*3/uL (ref 0.00–0.07)
Basophils Absolute: 0 10*3/uL (ref 0.0–0.1)
Basophils Relative: 0 %
Eosinophils Absolute: 0 10*3/uL (ref 0.0–0.5)
Eosinophils Relative: 0 %
HCT: 18.8 % — ABNORMAL LOW (ref 36.0–46.0)
Hemoglobin: 6.6 g/dL — ABNORMAL LOW (ref 12.0–15.0)
Immature Granulocytes: 0 %
Lymphocytes Relative: 67 %
Lymphs Abs: 0.5 10*3/uL — ABNORMAL LOW (ref 0.7–4.0)
MCH: 28.7 pg (ref 26.0–34.0)
MCHC: 35.1 g/dL (ref 30.0–36.0)
MCV: 81.7 fL (ref 80.0–100.0)
Monocytes Absolute: 0.2 10*3/uL (ref 0.1–1.0)
Monocytes Relative: 28 %
Neutro Abs: 0 10*3/uL — ABNORMAL LOW (ref 1.7–7.7)
Neutrophils Relative %: 5 %
Platelets: 90 10*3/uL — ABNORMAL LOW (ref 150–400)
RBC: 2.3 MIL/uL — ABNORMAL LOW (ref 3.87–5.11)
RDW: 12.4 % (ref 11.5–15.5)
Smear Review: NORMAL
WBC: 0.8 10*3/uL — CL (ref 4.0–10.5)
nRBC: 0 % (ref 0.0–0.2)

## 2018-05-31 LAB — COMPREHENSIVE METABOLIC PANEL
ALT: 16 U/L (ref 0–44)
AST: 14 U/L — ABNORMAL LOW (ref 15–41)
Albumin: 3.4 g/dL — ABNORMAL LOW (ref 3.5–5.0)
Alkaline Phosphatase: 72 U/L (ref 38–126)
Anion gap: 4 — ABNORMAL LOW (ref 5–15)
BUN: 13 mg/dL (ref 6–20)
CO2: 28 mmol/L (ref 22–32)
Calcium: 9 mg/dL (ref 8.9–10.3)
Chloride: 106 mmol/L (ref 98–111)
Creatinine, Ser: 1.01 mg/dL — ABNORMAL HIGH (ref 0.44–1.00)
GFR calc Af Amer: >60 mL/min (ref >60)
GFR calc non Af Amer: >60 mL/min (ref >60)
Glucose, Bld: 115 mg/dL — ABNORMAL HIGH (ref 70–99)
Potassium: 4 mmol/L (ref 3.5–5.1)
Sodium: 138 mmol/L (ref 135–145)
Total Bilirubin: 0.6 mg/dL (ref 0.3–1.2)
Total Protein: 6.8 g/dL (ref 6.5–8.1)

## 2018-05-31 LAB — SAMPLE TO BLOOD BANK

## 2018-05-31 LAB — PREPARE RBC (CROSSMATCH)

## 2018-05-31 MED ORDER — ACETAMINOPHEN 325 MG PO TABS
650.0000 mg | ORAL_TABLET | Freq: Once | ORAL | Status: AC
  Administered 2018-05-31: 650 mg via ORAL
  Filled 2018-05-31: qty 2

## 2018-05-31 MED ORDER — SODIUM CHLORIDE 0.9% IV SOLUTION
250.0000 mL | Freq: Once | INTRAVENOUS | Status: AC
  Administered 2018-05-31: 250 mL via INTRAVENOUS
  Filled 2018-05-31: qty 250

## 2018-05-31 MED ORDER — ONDANSETRON HCL 4 MG PO TABS
8.0000 mg | ORAL_TABLET | Freq: Once | ORAL | Status: AC
  Administered 2018-05-31: 8 mg via ORAL
  Filled 2018-05-31: qty 2

## 2018-05-31 MED ORDER — DIPHENHYDRAMINE HCL 25 MG PO CAPS
25.0000 mg | ORAL_CAPSULE | Freq: Once | ORAL | Status: AC
  Administered 2018-05-31: 25 mg via ORAL
  Filled 2018-05-31: qty 1

## 2018-05-31 MED ORDER — HEPARIN SOD (PORK) LOCK FLUSH 100 UNIT/ML IV SOLN: 500.0000 [IU] | Freq: Every day | INTRAVENOUS | Status: DC | PRN

## 2018-05-31 MED ORDER — SODIUM CHLORIDE 0.9% FLUSH
10.0000 mL | Freq: Once | INTRAVENOUS | Status: AC
  Administered 2018-05-31: 09:00:00 10 mL via INTRAVENOUS
  Filled 2018-05-31: qty 10

## 2018-05-31 MED ORDER — SODIUM CHLORIDE 0.9% FLUSH
10.0000 mL | INTRAVENOUS | Status: AC | PRN
  Administered 2018-05-31: 10 mL
  Filled 2018-05-31: qty 10

## 2018-05-31 MED ORDER — HEPARIN SOD (PORK) LOCK FLUSH 100 UNIT/ML IV SOLN
250.0000 [IU] | INTRAVENOUS | Status: AC | PRN
  Administered 2018-05-31: 250 [IU]
  Filled 2018-05-31: qty 5

## 2018-05-31 MED ORDER — AZACITIDINE CHEMO SQ INJECTION
75.0000 mg/m2 | Freq: Once | INTRAMUSCULAR | Status: AC
  Administered 2018-05-31: 157.5 mg via SUBCUTANEOUS
  Filled 2018-05-31: qty 6.3

## 2018-05-31 MED ORDER — SODIUM CHLORIDE 0.9% FLUSH
10.0000 mL | INTRAVENOUS | Status: DC | PRN
  Filled 2018-05-31: qty 10

## 2018-05-31 NOTE — Addendum Note (Signed)
Addended by: Lorrine Kin A on: 05/31/2018 09:16 AM   Modules accepted: Orders

## 2018-05-31 NOTE — Addendum Note (Signed)
Addended by: Brynda Peon on: 05/31/2018 01:19 PM   Modules accepted: Orders

## 2018-05-31 NOTE — Addendum Note (Signed)
Addended by: Nemiah Commander R on: 05/31/2018 12:11 PM   Modules accepted: Orders, SmartSet

## 2018-06-01 LAB — TYPE AND SCREEN
ABO/RH(D): O POS
Antibody Screen: NEGATIVE
Unit division: 0

## 2018-06-01 LAB — BPAM RBC
Blood Product Expiration Date: 202006052359
ISSUE DATE / TIME: 202005151109
Unit Type and Rh: 5100

## 2018-06-02 ENCOUNTER — Encounter: Payer: Self-pay | Admitting: Oncology

## 2018-06-03 ENCOUNTER — Other Ambulatory Visit: Payer: Self-pay

## 2018-06-03 ENCOUNTER — Other Ambulatory Visit: Payer: Self-pay | Admitting: Oncology

## 2018-06-03 ENCOUNTER — Other Ambulatory Visit: Payer: Self-pay | Admitting: *Deleted

## 2018-06-03 ENCOUNTER — Inpatient Hospital Stay: Payer: 59

## 2018-06-03 VITALS — BP 125/83 | HR 62 | Temp 97.3°F | Resp 19 | Wt 216.6 lb

## 2018-06-03 DIAGNOSIS — C9200 Acute myeloblastic leukemia, not having achieved remission: Secondary | ICD-10-CM

## 2018-06-03 DIAGNOSIS — Z95828 Presence of other vascular implants and grafts: Secondary | ICD-10-CM

## 2018-06-03 DIAGNOSIS — E876 Hypokalemia: Secondary | ICD-10-CM

## 2018-06-03 DIAGNOSIS — D696 Thrombocytopenia, unspecified: Secondary | ICD-10-CM

## 2018-06-03 DIAGNOSIS — Z5111 Encounter for antineoplastic chemotherapy: Secondary | ICD-10-CM | POA: Diagnosis not present

## 2018-06-03 LAB — CBC WITH DIFFERENTIAL/PLATELET
Abs Immature Granulocytes: 0.01 10*3/uL (ref 0.00–0.07)
Basophils Absolute: 0 10*3/uL (ref 0.0–0.1)
Basophils Relative: 0 %
Eosinophils Absolute: 0 10*3/uL (ref 0.0–0.5)
Eosinophils Relative: 0 %
HCT: 22.5 % — ABNORMAL LOW (ref 36.0–46.0)
Hemoglobin: 7.9 g/dL — ABNORMAL LOW (ref 12.0–15.0)
Immature Granulocytes: 1 %
Lymphocytes Relative: 66 %
Lymphs Abs: 0.5 10*3/uL — ABNORMAL LOW (ref 0.7–4.0)
MCH: 29.2 pg (ref 26.0–34.0)
MCHC: 35.1 g/dL (ref 30.0–36.0)
MCV: 83 fL (ref 80.0–100.0)
Monocytes Absolute: 0.2 10*3/uL (ref 0.1–1.0)
Monocytes Relative: 29 %
Neutro Abs: 0 10*3/uL — ABNORMAL LOW (ref 1.7–7.7)
Neutrophils Relative %: 4 %
Platelets: 99 10*3/uL — ABNORMAL LOW (ref 150–400)
RBC Morphology: NORMAL
RBC: 2.71 MIL/uL — ABNORMAL LOW (ref 3.87–5.11)
RDW: 12.6 % (ref 11.5–15.5)
Smear Review: NORMAL
WBC: 0.8 10*3/uL — CL (ref 4.0–10.5)
nRBC: 0 % (ref 0.0–0.2)

## 2018-06-03 LAB — SAMPLE TO BLOOD BANK

## 2018-06-03 LAB — COMPREHENSIVE METABOLIC PANEL
ALT: 14 U/L (ref 0–44)
AST: 15 U/L (ref 15–41)
Albumin: 3.5 g/dL (ref 3.5–5.0)
Alkaline Phosphatase: 73 U/L (ref 38–126)
Anion gap: 9 (ref 5–15)
BUN: 11 mg/dL (ref 6–20)
CO2: 25 mmol/L (ref 22–32)
Calcium: 8.6 mg/dL — ABNORMAL LOW (ref 8.9–10.3)
Chloride: 102 mmol/L (ref 98–111)
Creatinine, Ser: 0.9 mg/dL (ref 0.44–1.00)
GFR calc Af Amer: >60 mL/min (ref >60)
GFR calc non Af Amer: >60 mL/min (ref >60)
Glucose, Bld: 150 mg/dL — ABNORMAL HIGH (ref 70–99)
Potassium: 3.2 mmol/L — ABNORMAL LOW (ref 3.5–5.1)
Sodium: 136 mmol/L (ref 135–145)
Total Bilirubin: 0.9 mg/dL (ref 0.3–1.2)
Total Protein: 7.2 g/dL (ref 6.5–8.1)

## 2018-06-03 MED ORDER — HEPARIN SOD (PORK) LOCK FLUSH 100 UNIT/ML IV SOLN
250.0000 [IU] | Freq: Once | INTRAVENOUS | Status: AC
  Administered 2018-06-03: 250 [IU] via INTRAVENOUS
  Filled 2018-06-03: qty 5

## 2018-06-03 MED ORDER — SODIUM CHLORIDE 0.9% FLUSH
10.0000 mL | Freq: Once | INTRAVENOUS | Status: AC
  Administered 2018-06-03: 09:00:00 10 mL via INTRAVENOUS
  Filled 2018-06-03: qty 10

## 2018-06-03 MED ORDER — MAGIC MOUTHWASH W/LIDOCAINE: 5.0000 mL | Freq: Four times a day (QID) | ORAL | 3 refills | Status: AC | PRN

## 2018-06-03 MED ORDER — POTASSIUM CHLORIDE 20 MEQ/100ML IV SOLN
20.0000 meq | Freq: Once | INTRAVENOUS | Status: AC
  Administered 2018-06-03: 20 meq via INTRAVENOUS

## 2018-06-03 MED ORDER — PROCHLORPERAZINE MALEATE 10 MG PO TABS: 10.0000 mg | ORAL_TABLET | Freq: Four times a day (QID) | ORAL | 1 refills | Status: AC | PRN

## 2018-06-03 MED ORDER — AZACITIDINE CHEMO SQ INJECTION
75.0000 mg/m2 | Freq: Once | INTRAMUSCULAR | Status: AC
  Administered 2018-06-03: 157.5 mg via SUBCUTANEOUS
  Filled 2018-06-03: qty 6.3

## 2018-06-03 MED ORDER — ONDANSETRON HCL 4 MG PO TABS
8.0000 mg | ORAL_TABLET | Freq: Once | ORAL | Status: AC
  Administered 2018-06-03: 8 mg via ORAL
  Filled 2018-06-03: qty 2

## 2018-06-03 NOTE — Progress Notes (Signed)
MD made aware of pt.'s lab results. Hgb 7.9 and PLT-99. Orders to proceed with chemo injections today, give pt 23meq of Potassium over one hour, and no blood products needed today. MD also made aware that pt bit her tongue last Monday and states "my tongue has been swollen since, it hurts to eat, and feels really sore." RN assessed pt.'s mouth, no signs of infection and no open sore present. No new orders given to RN at this time. Pt updated and all questions answered at this time.   Rhonda Kane [employer]

## 2018-06-04 ENCOUNTER — Inpatient Hospital Stay: Payer: 59

## 2018-06-04 ENCOUNTER — Other Ambulatory Visit: Payer: Self-pay

## 2018-06-04 VITALS — BP 117/79 | HR 66 | Temp 98.3°F | Resp 20

## 2018-06-04 DIAGNOSIS — Z5111 Encounter for antineoplastic chemotherapy: Secondary | ICD-10-CM | POA: Diagnosis not present

## 2018-06-04 DIAGNOSIS — C9200 Acute myeloblastic leukemia, not having achieved remission: Secondary | ICD-10-CM

## 2018-06-04 MED ORDER — ONDANSETRON HCL 4 MG PO TABS
8.0000 mg | ORAL_TABLET | Freq: Once | ORAL | Status: AC
  Administered 2018-06-04: 8 mg via ORAL
  Filled 2018-06-04: qty 2

## 2018-06-04 MED ORDER — AZACITIDINE CHEMO SQ INJECTION
75.0000 mg/m2 | Freq: Once | INTRAMUSCULAR | Status: AC
  Administered 2018-06-04: 157.5 mg via SUBCUTANEOUS
  Filled 2018-06-04: qty 6.3

## 2018-06-06 ENCOUNTER — Other Ambulatory Visit: Payer: Self-pay

## 2018-06-06 ENCOUNTER — Other Ambulatory Visit: Payer: Self-pay | Admitting: Oncology

## 2018-06-06 ENCOUNTER — Inpatient Hospital Stay: Payer: 59

## 2018-06-06 VITALS — BP 97/65 | HR 68 | Resp 18

## 2018-06-06 DIAGNOSIS — Z5111 Encounter for antineoplastic chemotherapy: Secondary | ICD-10-CM | POA: Diagnosis not present

## 2018-06-06 DIAGNOSIS — E876 Hypokalemia: Secondary | ICD-10-CM

## 2018-06-06 DIAGNOSIS — Z452 Encounter for adjustment and management of vascular access device: Secondary | ICD-10-CM

## 2018-06-06 DIAGNOSIS — C9200 Acute myeloblastic leukemia, not having achieved remission: Secondary | ICD-10-CM

## 2018-06-06 DIAGNOSIS — D696 Thrombocytopenia, unspecified: Secondary | ICD-10-CM

## 2018-06-06 LAB — CBC WITH DIFFERENTIAL/PLATELET
Abs Immature Granulocytes: 0 10*3/uL (ref 0.00–0.07)
Basophils Absolute: 0 10*3/uL (ref 0.0–0.1)
Basophils Relative: 0 %
Eosinophils Absolute: 0 10*3/uL (ref 0.0–0.5)
Eosinophils Relative: 0 %
HCT: 20.7 % — ABNORMAL LOW (ref 36.0–46.0)
Hemoglobin: 7.2 g/dL — ABNORMAL LOW (ref 12.0–15.0)
Immature Granulocytes: 0 %
Lymphocytes Relative: 68 %
Lymphs Abs: 0.4 10*3/uL — ABNORMAL LOW (ref 0.7–4.0)
MCH: 29.1 pg (ref 26.0–34.0)
MCHC: 34.8 g/dL (ref 30.0–36.0)
MCV: 83.8 fL (ref 80.0–100.0)
Monocytes Absolute: 0.2 10*3/uL (ref 0.1–1.0)
Monocytes Relative: 29 %
Neutro Abs: 0 10*3/uL — ABNORMAL LOW (ref 1.7–7.7)
Neutrophils Relative %: 3 %
Platelets: 57 10*3/uL — ABNORMAL LOW (ref 150–400)
RBC: 2.47 MIL/uL — ABNORMAL LOW (ref 3.87–5.11)
RDW: 12.5 % (ref 11.5–15.5)
Smear Review: DECREASED
WBC Morphology: ABNORMAL
WBC: 0.6 10*3/uL — CL (ref 4.0–10.5)
nRBC: 0 % (ref 0.0–0.2)

## 2018-06-06 LAB — BASIC METABOLIC PANEL
Anion gap: 7 (ref 5–15)
BUN: 10 mg/dL (ref 6–20)
CO2: 26 mmol/L (ref 22–32)
Calcium: 8.7 mg/dL — ABNORMAL LOW (ref 8.9–10.3)
Chloride: 104 mmol/L (ref 98–111)
Creatinine, Ser: 1.25 mg/dL — ABNORMAL HIGH (ref 0.44–1.00)
GFR calc Af Amer: 59 mL/min — ABNORMAL LOW (ref >60)
GFR calc non Af Amer: 51 mL/min — ABNORMAL LOW (ref >60)
Glucose, Bld: 163 mg/dL — ABNORMAL HIGH (ref 70–99)
Potassium: 3.2 mmol/L — ABNORMAL LOW (ref 3.5–5.1)
Sodium: 137 mmol/L (ref 135–145)

## 2018-06-06 LAB — SAMPLE TO BLOOD BANK

## 2018-06-06 MED ORDER — POTASSIUM CHLORIDE 20 MEQ/100ML IV SOLN
20.0000 meq | Freq: Once | INTRAVENOUS | Status: AC
  Administered 2018-06-06: 20 meq via INTRAVENOUS

## 2018-06-06 MED ORDER — SODIUM CHLORIDE 0.9 % IV SOLN
Freq: Once | INTRAVENOUS | Status: AC
  Administered 2018-06-06: 10:00:00 via INTRAVENOUS
  Filled 2018-06-06: qty 250

## 2018-06-06 MED ORDER — SODIUM CHLORIDE 0.9% FLUSH
10.0000 mL | Freq: Once | INTRAVENOUS | Status: AC
  Administered 2018-06-06: 09:00:00 10 mL via INTRAVENOUS
  Filled 2018-06-06: qty 10

## 2018-06-06 MED ORDER — HEPARIN SOD (PORK) LOCK FLUSH 100 UNIT/ML IV SOLN
500.0000 [IU] | Freq: Once | INTRAVENOUS | Status: AC | PRN
  Administered 2018-06-06: 500 [IU]
  Filled 2018-06-06: qty 5

## 2018-06-06 NOTE — Progress Notes (Signed)
Reviewed labs with Dr. Tasia Catchings, Per Dr. Tasia Catchings, no blood of platelet transfusion at this time pt to receive 20 MEQs of IV potassium over one hour. Pt denies any complaints at this time. Pt stable.

## 2018-06-07 ENCOUNTER — Encounter: Payer: Self-pay | Admitting: Oncology

## 2018-06-07 ENCOUNTER — Other Ambulatory Visit: Payer: Self-pay

## 2018-06-07 ENCOUNTER — Telehealth: Payer: Self-pay | Admitting: *Deleted

## 2018-06-07 ENCOUNTER — Other Ambulatory Visit: Payer: Self-pay | Admitting: *Deleted

## 2018-06-07 ENCOUNTER — Inpatient Hospital Stay (HOSPITAL_BASED_OUTPATIENT_CLINIC_OR_DEPARTMENT_OTHER): Payer: 59 | Admitting: Oncology

## 2018-06-07 DIAGNOSIS — C9200 Acute myeloblastic leukemia, not having achieved remission: Secondary | ICD-10-CM

## 2018-06-07 DIAGNOSIS — D696 Thrombocytopenia, unspecified: Secondary | ICD-10-CM

## 2018-06-07 DIAGNOSIS — D6481 Anemia due to antineoplastic chemotherapy: Secondary | ICD-10-CM | POA: Diagnosis not present

## 2018-06-07 DIAGNOSIS — K1231 Oral mucositis (ulcerative) due to antineoplastic therapy: Secondary | ICD-10-CM

## 2018-06-07 DIAGNOSIS — Z79899 Other long term (current) drug therapy: Secondary | ICD-10-CM

## 2018-06-07 DIAGNOSIS — Z452 Encounter for adjustment and management of vascular access device: Secondary | ICD-10-CM

## 2018-06-07 DIAGNOSIS — E876 Hypokalemia: Secondary | ICD-10-CM

## 2018-06-07 MED ORDER — CHLORHEXIDINE GLUCONATE 0.12 % MT SOLN: 15.0000 mL | Freq: Two times a day (BID) | OROMUCOSAL | 3 refills | Status: AC

## 2018-06-07 MED ORDER — ONDANSETRON HCL 8 MG PO TABS: 8.0000 mg | ORAL_TABLET | Freq: Two times a day (BID) | ORAL | 1 refills | Status: AC | PRN

## 2018-06-07 NOTE — Progress Notes (Signed)
Called patient for Televisit via [employer].  Patient c/o sore in her mouth, "puss pocket" that is painful, using magic mouthwash, helping with pain, but doesn't seem to be getting any better, started almost 2 weeks ago.
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
## 2018-06-07 NOTE — Telephone Encounter (Signed)
Per Dr. Tasia Catchings: Lab / Poss blood 5/26, 5/29 & 6/5  Lab /MD/ Azacitadine on 6/8 D1  Azacitadine D2-5  Lab/Azacitadine D8 (6/15);  Azacitadine D9 & D10 per 03/09/18 los.  Called and left patient a detailed message making her aware of her upcoming scheduled appts. A reminder letter was mailed out as well.

## 2018-06-08 ENCOUNTER — Encounter: Payer: Self-pay | Admitting: Emergency Medicine

## 2018-06-08 ENCOUNTER — Other Ambulatory Visit: Payer: Self-pay

## 2018-06-08 ENCOUNTER — Emergency Department
Admission: EM | Admit: 2018-06-08 | Discharge: 2018-06-08 | Disposition: A | Discharge status: Home / Self Care | Payer: 59 | Attending: Emergency Medicine | Admitting: Emergency Medicine

## 2018-06-08 ENCOUNTER — Encounter: Payer: Self-pay | Admitting: Oncology

## 2018-06-08 DIAGNOSIS — C9290 Myeloid leukemia, unspecified, not having achieved remission: Secondary | ICD-10-CM | POA: Insufficient documentation

## 2018-06-08 DIAGNOSIS — K047 Periapical abscess without sinus: Secondary | ICD-10-CM

## 2018-06-08 DIAGNOSIS — Z79899 Other long term (current) drug therapy: Secondary | ICD-10-CM | POA: Insufficient documentation

## 2018-06-08 DIAGNOSIS — K0889 Other specified disorders of teeth and supporting structures: Secondary | ICD-10-CM | POA: Diagnosis present

## 2018-06-08 DIAGNOSIS — C9200 Acute myeloblastic leukemia, not having achieved remission: Secondary | ICD-10-CM

## 2018-06-08 HISTORY — DX: Malignant (primary) neoplasm, unspecified: C80.1

## 2018-06-08 LAB — CBC WITH DIFFERENTIAL/PLATELET
Abs Immature Granulocytes: 0 10*3/uL (ref 0.00–0.07)
Basophils Absolute: 0 10*3/uL (ref 0.0–0.1)
Basophils Relative: 0 %
Eosinophils Absolute: 0 10*3/uL (ref 0.0–0.5)
Eosinophils Relative: 0 %
HCT: 18.6 % — ABNORMAL LOW (ref 36.0–46.0)
Hemoglobin: 6.4 g/dL — ABNORMAL LOW (ref 12.0–15.0)
Immature Granulocytes: 0 %
Lymphocytes Relative: 79 %
Lymphs Abs: 0.4 10*3/uL — ABNORMAL LOW (ref 0.7–4.0)
MCH: 28.6 pg (ref 26.0–34.0)
MCHC: 34.4 g/dL (ref 30.0–36.0)
MCV: 83 fL (ref 80.0–100.0)
Monocytes Absolute: 0.1 10*3/uL (ref 0.1–1.0)
Monocytes Relative: 17 %
Neutro Abs: 0 10*3/uL — ABNORMAL LOW (ref 1.7–7.7)
Neutrophils Relative %: 4 %
Platelets: 48 10*3/uL — ABNORMAL LOW (ref 150–400)
RBC: 2.24 MIL/uL — ABNORMAL LOW (ref 3.87–5.11)
RDW: 12.3 % (ref 11.5–15.5)
Smear Review: DECREASED
WBC: 0.5 10*3/uL — CL (ref 4.0–10.5)
nRBC: 0 % (ref 0.0–0.2)

## 2018-06-08 LAB — BASIC METABOLIC PANEL
Anion gap: 10 (ref 5–15)
BUN: 8 mg/dL (ref 6–20)
CO2: 27 mmol/L (ref 22–32)
Calcium: 8.6 mg/dL — ABNORMAL LOW (ref 8.9–10.3)
Chloride: 105 mmol/L (ref 98–111)
Creatinine, Ser: 0.93 mg/dL (ref 0.44–1.00)
GFR calc Af Amer: >60 mL/min (ref >60)
GFR calc non Af Amer: >60 mL/min (ref >60)
Glucose, Bld: 97 mg/dL (ref 70–99)
Potassium: 3.2 mmol/L — ABNORMAL LOW (ref 3.5–5.1)
Sodium: 142 mmol/L (ref 135–145)

## 2018-06-08 MED ORDER — CLINDAMYCIN PHOSPHATE 600 MG/50ML IV SOLN
600.0000 mg | Freq: Once | INTRAVENOUS | Status: AC
  Administered 2018-06-08: 14:00:00 600 mg via INTRAVENOUS
  Filled 2018-06-08: qty 50

## 2018-06-08 MED ORDER — CLINDAMYCIN HCL 150 MG PO CAPS: 300.0000 mg | ORAL_CAPSULE | Freq: Three times a day (TID) | ORAL | 0 refills | Status: DC

## 2018-06-08 NOTE — ED Triage Notes (Signed)
Pt to ED via POV, pt sent by her cancer MD for evaluation of possible dental abscess. Pt states that she has been having pain in her mouth 12 days. Pt states that she thought she bit her tongue but it has not gotten better. Pt now has swelling on the left side of her face. Pt has AML, pt is on chemo, pt last had chemo last Tuesday. Pt is in NAD.

## 2018-06-08 NOTE — ED Notes (Signed)
AAOx3.  Skin warm and dry.  NAD 

## 2018-06-08 NOTE — ED Provider Notes (Signed)
[facility] Emergency Department Provider Note  ____________________________________________   First MD Initiated Contact with Patient 06/08/18 1302     (approximate)  I have reviewed the triage vital signs and the nursing notes.   HISTORY  Chief Complaint Dental Pain    HPI Rhonda Kane is a 48 y.o. female presents emergency department complaining of a possible dental abscess to the left lower jaw.  Patient is currently being treated with chemotherapy.  Last chemo was on Tuesday.  She states her white blood cell count was 0.8 at that time.  She denies any fever or chills.  No cough or congestion.  No chest pain or shortness of breath.    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   Anemia due to antineoplastic chemotherapy 02/14/2018   Goals of care, counseling/discussion 02/09/2018   Pancytopenia ([place]) 02/02/2018   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[place])    Anemia    Thrombocytopenia (H[facility])    Neutropenia (H[facility])    Symptomatic anemia 12/13/2017    Past Surgical History:  Procedure Laterality Date   ANKLE ARTHROSCOPY Right 05/07/2015   Procedure: ANKLE ARTHROSCOPY  / WITH TENOLYSIS;  Surgeon: [name], DPM;  Location: [facility] ORS;  Service: Podiatry;  Laterality: Right;  right ankle arthroscopy with debridement, osteochondral repair, tenolysis of posterior tibial  tendon   BREAST FIBROADENOMA SURGERY Right 03/10/2013   CARPAL TUNNEL RELEASE Left 09/11/2014   Procedure: CARPAL TUNNEL RELEASE;  Surgeon: Christophe Louis, MD;  Location: [facility] ORS;  Service: Orthopedics;  Laterality: Left;   CHOLECYSTECTOMY      Prior to Admission medications   Medication Sig Start Date End Date Taking? Authorizing Provider  allopurinol (ZYLOPRIM) 300 MG tablet Take by mouth. Take 1 tablet by mouth daily 04/18/18 04/18/19  [provider]  busPIRone (BUSPAR) 5 MG tablet Take 1 tablet (5 mg total) by mouth 2 (two) times daily. 04/14/18   Nicholes Mango, MD  carvedilol (COREG) 6.25 MG tablet Take 6.25 mg by mouth 2 (two) times daily. 01/26/18   [provider]  chlorhexidine (PERIDEX) 0.12 % solution Use as directed 15 mLs in the mouth or throat 2 (two) times daily. 06/07/18   Earlie Server, MD  clindamycin (CLEOCIN) 150 MG capsule Take 2 capsules (300 mg total) by mouth 3 (three) times daily. 06/08/18   Daegen Berrocal, Linden Dolin, PA-C  cyclobenzaprine (FLEXERIL) 5 MG tablet Take 1 tablet (5 mg total) by mouth daily as needed for muscle spasms. 05/28/18   Earlie Server, MD  ibuprofen (ADVIL,MOTRIN) 200 MG tablet Take 600 mg by mouth daily as needed.     [provider]  levofloxacin (LEVAQUIN) 500 MG tablet Take 500 mg by mouth daily. 03/06/18   [provider]  lidocaine-prilocaine (EMLA) cream Apply to affected area once 05/23/18   Earlie Server, MD  losartan (  COZAAR) 25 MG tablet Take 12.5 mg by mouth daily. 01/26/18   [provider]  magic mouthwash w/lidocaine SOLN Take 5 mLs by mouth 4 (four) times daily as needed for mouth pain. 06/03/18   Earlie Server, MD  metoprolol succinate (TOPROL-XL) 25 MG 24 hr tablet Take 25 mg by mouth daily. 04/18/18 04/18/19  [provider]  ondansetron (ZOFRAN) 8 MG tablet Take 1 tablet (8 mg total) by mouth 2 (two) times daily as needed (Nausea or vomiting). 06/07/18   Earlie Server, MD  pantoprazole (PROTONIX) 40 MG tablet Take 40 mg by  mouth daily. 01/26/18   [provider]  posaconazole (NOXAFIL) 100 MG TBEC delayed-release tablet Take 100 mg by mouth 3 (three) times daily.  01/26/18   [provider]  Potassium 99 MG TABS Take 99 mg by mouth daily.    [provider]  prochlorperazine (COMPAZINE) 10 MG tablet Take 1 tablet (10 mg total) by mouth every 6 (six) hours as needed (Nausea or vomiting). 06/03/18   Earlie Server, MD  traZODone (DESYREL) 50 MG tablet Take 50 mg by mouth at bedtime as needed for sleep.     [provider]  valACYclovir (VALTREX) 500 MG tablet Take 500 mg by mouth daily. 01/26/18   [provider]  venetoclax 100 MG TABS Take by mouth. Take 1 tablet (100 mg total) by mouth daily. Take with a meal and water. Do not chew, crush, or break tablets. 05/01/18   [provider]  venlafaxine XR (EFFEXOR-XR) 150 MG 24 hr capsule Take 2 capsules (300 mg total) by mouth daily. 04/15/18   Nicholes Mango, MD    Allergies Patient has no known allergies.  Family History  Problem Relation Age of Onset   COPD Mother    COPD Father     Social History Social History   Tobacco Use   Smoking status: Never Smoker   Smokeless tobacco: Never Used  Substance Use Topics   Alcohol use: No   Drug use: No    Review of Systems  Constitutional: No fever/chills Eyes: No visual changes. ENT: No sore throat.  Positive for dental pain and left-sided jaw swelling Respiratory: Denies cough Genitourinary: Negative for dysuria. Musculoskeletal: Negative for back pain. Skin: Negative for rash.    ____________________________________________   PHYSICAL EXAM:  VITAL SIGNS: ED Triage Vitals [06/08/18 1257]  Enc Vitals Group     BP 107/66     Pulse Rate 74     Resp 18     Temp 99.1 F (37.3 C)     Temp Source Oral     SpO2 96 %     Weight 215 lb (97.5 kg)     Height '5\' 4"'$  (1.626 m)     Head Circumference      Peak Flow      Pain Score 10     Pain Loc       Pain Edu?      Excl. in [place]?     Constitutional: Alert and oriented. Well appearing and in no acute distress. Eyes: Conjunctivae are normal.  Head: Atraumatic.  Positive for small knot at the left mandible, nose: No congestion/rhinnorhea. Mouth/Throat: Mucous membranes are moist.   Neck:  supple no lymphadenopathy noted Cardiovascular: Normal rate, regular rhythm. Heart sounds are normal Respiratory: Normal respiratory effort.  No retractions, lungs c t a  GU: deferred Musculoskeletal: FROM all extremities, warm and well perfused Neurologic:  Normal speech and language.  Skin:  Skin is warm, dry and intact. No rash noted. Psychiatric: Mood and affect are normal. Speech and behavior are normal.  ____________________________________________   LABS (all labs ordered are listed, but only abnormal results are displayed)  Labs Reviewed  CBC WITH DIFFERENTIAL/PLATELET - Abnormal; Notable for the following components:      Result Value   WBC 0.5 (*)    RBC 2.24 (*)    Hemoglobin 6.4 (*)    HCT 18.6 (*)    Platelets 48 (*)    Neutro Abs 0.0 (*)    Lymphs Abs 0.4 (*)    All other components within normal limits  BASIC METABOLIC PANEL - Abnormal; Notable for the following components:   Potassium 3.2 (*)    Calcium 8.6 (*)    All other components within normal limits   ____________________________________________   ____________________________________________  RADIOLOGY    ____________________________________________   PROCEDURES  Procedure(s) performed: Clindamycin 600 mg IV  Procedures    ____________________________________________   INITIAL IMPRESSION / ASSESSMENT AND PLAN / ED COURSE  Pertinent labs & imaging results that were available during my care of the patient were reviewed by me and considered in my medical decision making (see chart for details).   Patient is a 48 year old female presents emergency department with complaints of dental pain and  questionable dental abscess.  Patient is currently undergoing chemo and last treatment was on Tuesday.  Physical exam shows a small dental abscess with swelling noted at the left mandible.  Due to the patient's recent chemo and decreased WBCs, clindamycin 600 mg IV was given.  CBC , MEt b  be ordered    ----------------------------------------- 3:05 PM on 06/08/2018 -----------------------------------------  CBC has a WBC of 0.5 which is slightly lower than the 0.8 she had earlier in the week.  This is typical to have a decrease after chemotherapy.  Therefore she can follow-up with her oncologist.  Metabolic panel is basically normal.  The patient was given a prescription for clindamycin.  She is to follow-up with her regular doctor and dentist.  She states she understands and will comply.  She is to return to the emergency department for worsening.  She is discharged stable condition.  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     Note:  This document was prepared using Dragon voice recognition software and may include unintentional dictation errors.    Versie Starks, PA-C 06/08/18 1508    Earleen Newport, MD 06/09/18 [phone]

## 2018-06-08 NOTE — Discharge Instructions (Addendum)
Follow-up with your regular doctor if not better in 3 days.  Return emergency department worsening.  Take medication as prescribed.  Your WBC was 0.5 today which is typical after chemotherapy.

## 2018-06-12 ENCOUNTER — Other Ambulatory Visit: Payer: Self-pay | Admitting: Oncology

## 2018-06-12 ENCOUNTER — Inpatient Hospital Stay: Payer: 59

## 2018-06-12 ENCOUNTER — Other Ambulatory Visit: Payer: Self-pay

## 2018-06-12 VITALS — BP 138/74 | HR 84 | Temp 98.0°F | Resp 20

## 2018-06-12 DIAGNOSIS — C9200 Acute myeloblastic leukemia, not having achieved remission: Secondary | ICD-10-CM

## 2018-06-12 DIAGNOSIS — D649 Anemia, unspecified: Secondary | ICD-10-CM

## 2018-06-12 DIAGNOSIS — Z5111 Encounter for antineoplastic chemotherapy: Secondary | ICD-10-CM | POA: Diagnosis not present

## 2018-06-12 DIAGNOSIS — D696 Thrombocytopenia, unspecified: Secondary | ICD-10-CM

## 2018-06-12 DIAGNOSIS — E876 Hypokalemia: Secondary | ICD-10-CM

## 2018-06-12 DIAGNOSIS — Z452 Encounter for adjustment and management of vascular access device: Secondary | ICD-10-CM

## 2018-06-12 LAB — CBC WITH DIFFERENTIAL/PLATELET
Abs Immature Granulocytes: 0.01 10*3/uL (ref 0.00–0.07)
Basophils Absolute: 0 10*3/uL (ref 0.0–0.1)
Basophils Relative: 0 %
Eosinophils Absolute: 0 10*3/uL (ref 0.0–0.5)
Eosinophils Relative: 0 %
HCT: 16.2 % — ABNORMAL LOW (ref 36.0–46.0)
Hemoglobin: 5.7 g/dL — ABNORMAL LOW (ref 12.0–15.0)
Immature Granulocytes: 2 %
Lymphocytes Relative: 64 %
Lymphs Abs: 0.4 10*3/uL — ABNORMAL LOW (ref 0.7–4.0)
MCH: 29.2 pg (ref 26.0–34.0)
MCHC: 35.2 g/dL (ref 30.0–36.0)
MCV: 83.1 fL (ref 80.0–100.0)
Monocytes Absolute: 0.2 10*3/uL (ref 0.1–1.0)
Monocytes Relative: 31 %
Neutro Abs: 0 10*3/uL — ABNORMAL LOW (ref 1.7–7.7)
Neutrophils Relative %: 3 %
Platelets: 43 10*3/uL — ABNORMAL LOW (ref 150–400)
RBC: 1.95 MIL/uL — ABNORMAL LOW (ref 3.87–5.11)
RDW: 12.1 % (ref 11.5–15.5)
Smear Review: NORMAL
WBC: 0.6 10*3/uL — CL (ref 4.0–10.5)
nRBC: 0 % (ref 0.0–0.2)

## 2018-06-12 LAB — COMPREHENSIVE METABOLIC PANEL
ALT: 14 U/L (ref 0–44)
AST: 15 U/L (ref 15–41)
Albumin: 3.1 g/dL — ABNORMAL LOW (ref 3.5–5.0)
Alkaline Phosphatase: 79 U/L (ref 38–126)
Anion gap: 10 (ref 5–15)
BUN: 11 mg/dL (ref 6–20)
CO2: 26 mmol/L (ref 22–32)
Calcium: 8.5 mg/dL — ABNORMAL LOW (ref 8.9–10.3)
Chloride: 103 mmol/L (ref 98–111)
Creatinine, Ser: 1.08 mg/dL — ABNORMAL HIGH (ref 0.44–1.00)
GFR calc Af Amer: >60 mL/min (ref >60)
GFR calc non Af Amer: >60 mL/min (ref >60)
Glucose, Bld: 136 mg/dL — ABNORMAL HIGH (ref 70–99)
Potassium: 3.1 mmol/L — ABNORMAL LOW (ref 3.5–5.1)
Sodium: 139 mmol/L (ref 135–145)
Total Bilirubin: 0.6 mg/dL (ref 0.3–1.2)
Total Protein: 6.5 g/dL (ref 6.5–8.1)

## 2018-06-12 LAB — PREPARE RBC (CROSSMATCH)

## 2018-06-12 LAB — SAMPLE TO BLOOD BANK

## 2018-06-12 MED ORDER — ACETAMINOPHEN 325 MG PO TABS
650.0000 mg | ORAL_TABLET | Freq: Once | ORAL | Status: AC
  Administered 2018-06-12: 11:00:00 650 mg via ORAL
  Filled 2018-06-12: qty 2

## 2018-06-12 MED ORDER — POTASSIUM CHLORIDE 20 MEQ/100ML IV SOLN
20.0000 meq | Freq: Once | INTRAVENOUS | Status: AC
  Administered 2018-06-12: 10:00:00 20 meq via INTRAVENOUS

## 2018-06-12 MED ORDER — HEPARIN SOD (PORK) LOCK FLUSH 100 UNIT/ML IV SOLN
250.0000 [IU] | INTRAVENOUS | Status: AC | PRN
  Administered 2018-06-12: 250 [IU]
  Filled 2018-06-12: qty 5

## 2018-06-12 MED ORDER — SODIUM CHLORIDE 0.9% IV SOLUTION
250.0000 mL | Freq: Once | INTRAVENOUS | Status: AC
  Administered 2018-06-12: 10:00:00 250 mL via INTRAVENOUS
  Filled 2018-06-12: qty 250

## 2018-06-12 MED ORDER — SODIUM CHLORIDE 0.9% FLUSH
10.0000 mL | Freq: Once | INTRAVENOUS | Status: AC
  Administered 2018-06-12: 09:00:00 10 mL via INTRAVENOUS
  Filled 2018-06-12: qty 10

## 2018-06-12 MED ORDER — DIPHENHYDRAMINE HCL 25 MG PO CAPS
25.0000 mg | ORAL_CAPSULE | Freq: Once | ORAL | Status: AC
  Administered 2018-06-12: 11:00:00 25 mg via ORAL
  Filled 2018-06-12: qty 1

## 2018-06-13 LAB — BPAM RBC
Blood Product Expiration Date: 202006202359
Blood Product Expiration Date: 202006212359
ISSUE DATE / TIME: 202005271134
ISSUE DATE / TIME: 202005271408
Unit Type and Rh: 5100
Unit Type and Rh: 5100

## 2018-06-13 LAB — TYPE AND SCREEN
ABO/RH(D): O POS
Antibody Screen: NEGATIVE
Unit division: 0
Unit division: 0

## 2018-06-14 ENCOUNTER — Inpatient Hospital Stay: Payer: 59

## 2018-06-21 ENCOUNTER — Inpatient Hospital Stay: Payer: Self-pay

## 2018-06-21 ENCOUNTER — Inpatient Hospital Stay: Payer: Self-pay | Attending: Oncology

## 2018-06-24 ENCOUNTER — Inpatient Hospital Stay: Payer: Self-pay

## 2018-06-24 ENCOUNTER — Encounter: Payer: Self-pay | Admitting: Oncology

## 2018-06-24 ENCOUNTER — Other Ambulatory Visit: Payer: Self-pay

## 2018-06-24 ENCOUNTER — Inpatient Hospital Stay (HOSPITAL_BASED_OUTPATIENT_CLINIC_OR_DEPARTMENT_OTHER): Payer: Self-pay | Admitting: Oncology

## 2018-06-24 VITALS — BP 124/90 | HR 84 | Temp 99.1°F | Resp 18 | Wt 211.9 lb

## 2018-06-24 DIAGNOSIS — C9200 Acute myeloblastic leukemia, not having achieved remission: Secondary | ICD-10-CM | POA: Insufficient documentation

## 2018-06-24 DIAGNOSIS — D6481 Anemia due to antineoplastic chemotherapy: Secondary | ICD-10-CM | POA: Diagnosis not present

## 2018-06-24 DIAGNOSIS — Z791 Long term (current) use of non-steroidal anti-inflammatories (NSAID): Secondary | ICD-10-CM | POA: Insufficient documentation

## 2018-06-24 DIAGNOSIS — Z95828 Presence of other vascular implants and grafts: Secondary | ICD-10-CM

## 2018-06-24 DIAGNOSIS — G4700 Insomnia, unspecified: Secondary | ICD-10-CM | POA: Insufficient documentation

## 2018-06-24 DIAGNOSIS — D696 Thrombocytopenia, unspecified: Secondary | ICD-10-CM

## 2018-06-24 DIAGNOSIS — E860 Dehydration: Secondary | ICD-10-CM | POA: Insufficient documentation

## 2018-06-24 DIAGNOSIS — Z5111 Encounter for antineoplastic chemotherapy: Secondary | ICD-10-CM | POA: Diagnosis not present

## 2018-06-24 DIAGNOSIS — Z79899 Other long term (current) drug therapy: Secondary | ICD-10-CM | POA: Diagnosis not present

## 2018-06-24 DIAGNOSIS — F329 Major depressive disorder, single episode, unspecified: Secondary | ICD-10-CM

## 2018-06-24 DIAGNOSIS — D649 Anemia, unspecified: Secondary | ICD-10-CM

## 2018-06-24 DIAGNOSIS — E876 Hypokalemia: Secondary | ICD-10-CM

## 2018-06-24 DIAGNOSIS — K1231 Oral mucositis (ulcerative) due to antineoplastic therapy: Secondary | ICD-10-CM

## 2018-06-24 DIAGNOSIS — L739 Follicular disorder, unspecified: Secondary | ICD-10-CM

## 2018-06-24 DIAGNOSIS — Z452 Encounter for adjustment and management of vascular access device: Secondary | ICD-10-CM

## 2018-06-24 LAB — CBC WITH DIFFERENTIAL/PLATELET
Abs Immature Granulocytes: 0.02 10*3/uL (ref 0.00–0.07)
Basophils Absolute: 0 10*3/uL (ref 0.0–0.1)
Basophils Relative: 0 %
Eosinophils Absolute: 0 10*3/uL (ref 0.0–0.5)
Eosinophils Relative: 0 %
HCT: 21.3 % — ABNORMAL LOW (ref 36.0–46.0)
Hemoglobin: 7.2 g/dL — ABNORMAL LOW (ref 12.0–15.0)
Immature Granulocytes: 3 %
Lymphocytes Relative: 47 %
Lymphs Abs: 0.4 10*3/uL — ABNORMAL LOW (ref 0.7–4.0)
MCH: 28.7 pg (ref 26.0–34.0)
MCHC: 33.8 g/dL (ref 30.0–36.0)
MCV: 84.9 fL (ref 80.0–100.0)
Monocytes Absolute: 0.4 10*3/uL (ref 0.1–1.0)
Monocytes Relative: 49 %
Neutro Abs: 0 10*3/uL — ABNORMAL LOW (ref 1.7–7.7)
Neutrophils Relative %: 1 %
Platelets: 150 10*3/uL (ref 150–400)
RBC: 2.51 MIL/uL — ABNORMAL LOW (ref 3.87–5.11)
RDW: 12 % (ref 11.5–15.5)
Smear Review: NORMAL
WBC: 0.8 10*3/uL — CL (ref 4.0–10.5)
nRBC: 0 % (ref 0.0–0.2)

## 2018-06-24 LAB — COMPREHENSIVE METABOLIC PANEL
ALT: 14 U/L (ref 0–44)
AST: 11 U/L — ABNORMAL LOW (ref 15–41)
Albumin: 3.4 g/dL — ABNORMAL LOW (ref 3.5–5.0)
Alkaline Phosphatase: 84 U/L (ref 38–126)
Anion gap: 9 (ref 5–15)
BUN: 13 mg/dL (ref 6–20)
CO2: 25 mmol/L (ref 22–32)
Calcium: 8.8 mg/dL — ABNORMAL LOW (ref 8.9–10.3)
Chloride: 103 mmol/L (ref 98–111)
Creatinine, Ser: 0.83 mg/dL (ref 0.44–1.00)
GFR calc Af Amer: >60 mL/min (ref >60)
GFR calc non Af Amer: >60 mL/min (ref >60)
Glucose, Bld: 152 mg/dL — ABNORMAL HIGH (ref 70–99)
Potassium: 3.4 mmol/L — ABNORMAL LOW (ref 3.5–5.1)
Sodium: 137 mmol/L (ref 135–145)
Total Bilirubin: 0.5 mg/dL (ref 0.3–1.2)
Total Protein: 7.4 g/dL (ref 6.5–8.1)

## 2018-06-24 LAB — SAMPLE TO BLOOD BANK

## 2018-06-24 MED ORDER — ONDANSETRON HCL 4 MG PO TABS
8.0000 mg | ORAL_TABLET | Freq: Once | ORAL | Status: AC
  Administered 2018-06-24: 8 mg via ORAL
  Filled 2018-06-24: qty 2

## 2018-06-24 MED ORDER — HEPARIN SOD (PORK) LOCK FLUSH 100 UNIT/ML IV SOLN
INTRAVENOUS | Status: AC
  Filled 2018-06-24: qty 5

## 2018-06-24 MED ORDER — AZACITIDINE CHEMO SQ INJECTION
75.0000 mg/m2 | Freq: Once | INTRAMUSCULAR | Status: AC
  Administered 2018-06-24: 157.5 mg via SUBCUTANEOUS
  Filled 2018-06-24: qty 6.3

## 2018-06-24 MED ORDER — SODIUM CHLORIDE 0.9% FLUSH
10.0000 mL | Freq: Once | INTRAVENOUS | Status: AC
  Administered 2018-06-24: 10 mL via INTRAVENOUS
  Filled 2018-06-24: qty 10

## 2018-06-24 MED ORDER — HEPARIN SOD (PORK) LOCK FLUSH 100 UNIT/ML IV SOLN
500.0000 [IU] | Freq: Once | INTRAVENOUS | Status: AC
  Administered 2018-06-24: 250 [IU] via INTRAVENOUS

## 2018-06-24 MED ORDER — MUPIROCIN 2 % EX OINT: 1.0000 "application " | TOPICAL_OINTMENT | Freq: Three times a day (TID) | CUTANEOUS | 0 refills | Status: AC
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
## 2018-06-24 NOTE — Progress Notes (Signed)
Patient here for follow up. Pt states she still has small sore on right side of tongue, but magic mouthwash has been helping.

## 2018-06-24 NOTE — Progress Notes (Signed)
Per Almyra Free CMA per Dr. Tasia Catchings, MD reviewed labs and okay to proceed with treatment at this time. ( Hemoglobin 7.2, ANC 0.0)

## 2018-06-25 ENCOUNTER — Other Ambulatory Visit: Payer: Self-pay

## 2018-06-25 ENCOUNTER — Inpatient Hospital Stay: Payer: Self-pay

## 2018-06-25 ENCOUNTER — Other Ambulatory Visit: Payer: Self-pay | Admitting: Oncology

## 2018-06-25 VITALS — BP 122/77 | HR 85 | Resp 19

## 2018-06-25 DIAGNOSIS — C9200 Acute myeloblastic leukemia, not having achieved remission: Secondary | ICD-10-CM

## 2018-06-25 DIAGNOSIS — Z5111 Encounter for antineoplastic chemotherapy: Secondary | ICD-10-CM | POA: Diagnosis not present

## 2018-06-25 DIAGNOSIS — E876 Hypokalemia: Secondary | ICD-10-CM

## 2018-06-25 DIAGNOSIS — D649 Anemia, unspecified: Secondary | ICD-10-CM

## 2018-06-25 MED ORDER — AZACITIDINE CHEMO SQ INJECTION
75.0000 mg/m2 | Freq: Once | INTRAMUSCULAR | Status: AC
  Administered 2018-06-25: 157.5 mg via SUBCUTANEOUS
  Filled 2018-06-25: qty 6.3

## 2018-06-25 MED ORDER — SODIUM CHLORIDE 0.9 % IV SOLN
Freq: Once | INTRAVENOUS | Status: AC
  Administered 2018-06-25: 10:00:00 via INTRAVENOUS
  Filled 2018-06-25: qty 250

## 2018-06-25 MED ORDER — ONDANSETRON HCL 4 MG PO TABS
8.0000 mg | ORAL_TABLET | Freq: Once | ORAL | Status: AC
  Administered 2018-06-25: 8 mg via ORAL
  Filled 2018-06-25: qty 2

## 2018-06-25 MED ORDER — HEPARIN SOD (PORK) LOCK FLUSH 100 UNIT/ML IV SOLN
250.0000 [IU] | Freq: Once | INTRAVENOUS | Status: AC | PRN
  Administered 2018-06-25: 250 [IU]
  Filled 2018-06-25: qty 5

## 2018-06-25 NOTE — Addendum Note (Signed)
Addended by: Earlie Server on: 06/25/2018 10:00 AM   Modules accepted: Orders

## 2018-06-26 ENCOUNTER — Inpatient Hospital Stay: Payer: Self-pay

## 2018-06-26 ENCOUNTER — Other Ambulatory Visit: Payer: Self-pay

## 2018-06-26 VITALS — BP 108/74 | HR 82 | Temp 98.6°F | Resp 20

## 2018-06-26 DIAGNOSIS — C9200 Acute myeloblastic leukemia, not having achieved remission: Secondary | ICD-10-CM

## 2018-06-26 DIAGNOSIS — Z5111 Encounter for antineoplastic chemotherapy: Secondary | ICD-10-CM | POA: Diagnosis not present

## 2018-06-26 DIAGNOSIS — D649 Anemia, unspecified: Secondary | ICD-10-CM

## 2018-06-26 LAB — PREPARE RBC (CROSSMATCH)

## 2018-06-26 MED ORDER — DIPHENHYDRAMINE HCL 25 MG PO CAPS
25.0000 mg | ORAL_CAPSULE | Freq: Once | ORAL | Status: AC
  Administered 2018-06-26: 25 mg via ORAL
  Filled 2018-06-26: qty 1

## 2018-06-26 MED ORDER — SODIUM CHLORIDE 0.9% IV SOLUTION
250.0000 mL | Freq: Once | INTRAVENOUS | Status: AC
  Administered 2018-06-26: 250 mL via INTRAVENOUS
  Filled 2018-06-26: qty 250

## 2018-06-26 MED ORDER — HEPARIN SOD (PORK) LOCK FLUSH 100 UNIT/ML IV SOLN
250.0000 [IU] | INTRAVENOUS | Status: DC | PRN
  Filled 2018-06-26: qty 5

## 2018-06-26 MED ORDER — ONDANSETRON HCL 4 MG PO TABS
8.0000 mg | ORAL_TABLET | Freq: Once | ORAL | Status: AC
  Administered 2018-06-26: 8 mg via ORAL
  Filled 2018-06-26: qty 2

## 2018-06-26 MED ORDER — ACETAMINOPHEN 325 MG PO TABS
650.0000 mg | ORAL_TABLET | Freq: Once | ORAL | Status: AC
  Administered 2018-06-26: 650 mg via ORAL
  Filled 2018-06-26: qty 2

## 2018-06-26 MED ORDER — AZACITIDINE CHEMO SQ INJECTION
75.0000 mg/m2 | Freq: Once | INTRAMUSCULAR | Status: AC
  Administered 2018-06-26: 157.5 mg via SUBCUTANEOUS
  Filled 2018-06-26: qty 6.3

## 2018-06-27 ENCOUNTER — Other Ambulatory Visit: Payer: Self-pay

## 2018-06-27 ENCOUNTER — Inpatient Hospital Stay: Payer: Self-pay

## 2018-06-27 VITALS — BP 159/86 | HR 89 | Temp 98.6°F | Resp 20

## 2018-06-27 DIAGNOSIS — Z5111 Encounter for antineoplastic chemotherapy: Secondary | ICD-10-CM | POA: Diagnosis not present

## 2018-06-27 DIAGNOSIS — C9200 Acute myeloblastic leukemia, not having achieved remission: Secondary | ICD-10-CM

## 2018-06-27 LAB — TYPE AND SCREEN
ABO/RH(D): O POS
Antibody Screen: NEGATIVE
Unit division: 0

## 2018-06-27 LAB — BPAM RBC
Blood Product Expiration Date: 202006242359
ISSUE DATE / TIME: 202006100934
Unit Type and Rh: 5100

## 2018-06-27 MED ORDER — ONDANSETRON HCL 4 MG PO TABS
8.0000 mg | ORAL_TABLET | Freq: Once | ORAL | Status: AC
  Administered 2018-06-27: 8 mg via ORAL
  Filled 2018-06-27: qty 2

## 2018-06-27 MED ORDER — AZACITIDINE CHEMO SQ INJECTION
75.0000 mg/m2 | Freq: Once | INTRAMUSCULAR | Status: AC
  Administered 2018-06-27: 157.5 mg via SUBCUTANEOUS
  Filled 2018-06-27: qty 6.3

## 2018-06-28 ENCOUNTER — Encounter: Payer: Self-pay | Admitting: Oncology

## 2018-06-28 ENCOUNTER — Inpatient Hospital Stay: Payer: Self-pay

## 2018-06-28 ENCOUNTER — Other Ambulatory Visit: Payer: Self-pay

## 2018-06-28 VITALS — BP 131/82 | HR 97 | Temp 99.0°F | Resp 20

## 2018-06-28 DIAGNOSIS — Z5111 Encounter for antineoplastic chemotherapy: Secondary | ICD-10-CM | POA: Diagnosis not present

## 2018-06-28 DIAGNOSIS — C9200 Acute myeloblastic leukemia, not having achieved remission: Secondary | ICD-10-CM

## 2018-06-28 MED ORDER — ONDANSETRON HCL 4 MG PO TABS
8.0000 mg | ORAL_TABLET | Freq: Once | ORAL | Status: AC
  Administered 2018-06-28: 8 mg via ORAL
  Filled 2018-06-28: qty 2

## 2018-06-28 MED ORDER — AZACITIDINE CHEMO SQ INJECTION
75.0000 mg/m2 | Freq: Once | INTRAMUSCULAR | Status: AC
  Administered 2018-06-28: 157.5 mg via SUBCUTANEOUS
  Filled 2018-06-28: qty 6.3

## 2018-07-01 ENCOUNTER — Inpatient Hospital Stay (HOSPITAL_BASED_OUTPATIENT_CLINIC_OR_DEPARTMENT_OTHER): Payer: Managed Care, Other (non HMO) | Admitting: Oncology

## 2018-07-01 ENCOUNTER — Encounter: Payer: Self-pay | Admitting: Oncology

## 2018-07-01 ENCOUNTER — Inpatient Hospital Stay: Payer: Self-pay

## 2018-07-01 ENCOUNTER — Encounter: Payer: Self-pay | Admitting: Emergency Medicine

## 2018-07-01 ENCOUNTER — Other Ambulatory Visit: Payer: Self-pay

## 2018-07-01 ENCOUNTER — Other Ambulatory Visit: Payer: 59

## 2018-07-01 ENCOUNTER — Ambulatory Visit: Payer: 59

## 2018-07-01 ENCOUNTER — Inpatient Hospital Stay
Admission: EM | Admit: 2018-07-01 | Discharge: 2018-07-07 | DRG: 871 | Disposition: A | Discharge status: Home / Self Care | Payer: Self-pay | Attending: Internal Medicine | Admitting: Internal Medicine

## 2018-07-01 ENCOUNTER — Other Ambulatory Visit: Payer: Self-pay | Admitting: Oncology

## 2018-07-01 VITALS — BP 110/70 | HR 96 | Temp 98.0°F | Wt 209.1 lb

## 2018-07-01 VITALS — BP 117/78 | HR 71 | Temp 97.6°F | Resp 20

## 2018-07-01 DIAGNOSIS — E876 Hypokalemia: Secondary | ICD-10-CM

## 2018-07-01 DIAGNOSIS — D630 Anemia in neoplastic disease: Secondary | ICD-10-CM | POA: Diagnosis present

## 2018-07-01 DIAGNOSIS — E8881 Metabolic syndrome: Secondary | ICD-10-CM | POA: Diagnosis present

## 2018-07-01 DIAGNOSIS — K1230 Oral mucositis (ulcerative), unspecified: Secondary | ICD-10-CM | POA: Diagnosis present

## 2018-07-01 DIAGNOSIS — Z79899 Other long term (current) drug therapy: Secondary | ICD-10-CM | POA: Diagnosis not present

## 2018-07-01 DIAGNOSIS — D6481 Anemia due to antineoplastic chemotherapy: Secondary | ICD-10-CM | POA: Diagnosis not present

## 2018-07-01 DIAGNOSIS — F329 Major depressive disorder, single episode, unspecified: Secondary | ICD-10-CM | POA: Diagnosis not present

## 2018-07-01 DIAGNOSIS — Z791 Long term (current) use of non-steroidal anti-inflammatories (NSAID): Secondary | ICD-10-CM | POA: Diagnosis not present

## 2018-07-01 DIAGNOSIS — D696 Thrombocytopenia, unspecified: Secondary | ICD-10-CM

## 2018-07-01 DIAGNOSIS — E860 Dehydration: Secondary | ICD-10-CM | POA: Insufficient documentation

## 2018-07-01 DIAGNOSIS — Z20828 Contact with and (suspected) exposure to other viral communicable diseases: Secondary | ICD-10-CM | POA: Diagnosis present

## 2018-07-01 DIAGNOSIS — C9200 Acute myeloblastic leukemia, not having achieved remission: Secondary | ICD-10-CM

## 2018-07-01 DIAGNOSIS — L982 Febrile neutrophilic dermatosis [Sweet]: Secondary | ICD-10-CM | POA: Diagnosis present

## 2018-07-01 DIAGNOSIS — R5081 Fever presenting with conditions classified elsewhere: Secondary | ICD-10-CM | POA: Diagnosis present

## 2018-07-01 DIAGNOSIS — G4700 Insomnia, unspecified: Secondary | ICD-10-CM

## 2018-07-01 DIAGNOSIS — I5020 Unspecified systolic (congestive) heart failure: Secondary | ICD-10-CM | POA: Diagnosis present

## 2018-07-01 DIAGNOSIS — Z825 Family history of asthma and other chronic lower respiratory diseases: Secondary | ICD-10-CM

## 2018-07-01 DIAGNOSIS — K219 Gastro-esophageal reflux disease without esophagitis: Secondary | ICD-10-CM | POA: Diagnosis present

## 2018-07-01 DIAGNOSIS — D709 Neutropenia, unspecified: Secondary | ICD-10-CM | POA: Diagnosis present

## 2018-07-01 DIAGNOSIS — D649 Anemia, unspecified: Secondary | ICD-10-CM

## 2018-07-01 DIAGNOSIS — D6959 Other secondary thrombocytopenia: Secondary | ICD-10-CM | POA: Diagnosis present

## 2018-07-01 DIAGNOSIS — Z5111 Encounter for antineoplastic chemotherapy: Secondary | ICD-10-CM | POA: Diagnosis not present

## 2018-07-01 DIAGNOSIS — D61810 Antineoplastic chemotherapy induced pancytopenia: Secondary | ICD-10-CM | POA: Diagnosis present

## 2018-07-01 DIAGNOSIS — A419 Sepsis, unspecified organism: Principal | ICD-10-CM | POA: Diagnosis present

## 2018-07-01 DIAGNOSIS — I110 Hypertensive heart disease with heart failure: Secondary | ICD-10-CM | POA: Diagnosis present

## 2018-07-01 HISTORY — DX: Leukemia, unspecified not having achieved remission: C95.90

## 2018-07-01 LAB — COMPREHENSIVE METABOLIC PANEL
ALT: 10 U/L (ref 0–44)
AST: 9 U/L — ABNORMAL LOW (ref 15–41)
Albumin: 3 g/dL — ABNORMAL LOW (ref 3.5–5.0)
Alkaline Phosphatase: 102 U/L (ref 38–126)
Anion gap: 10 (ref 5–15)
BUN: 17 mg/dL (ref 6–20)
CO2: 27 mmol/L (ref 22–32)
Calcium: 8.9 mg/dL (ref 8.9–10.3)
Chloride: 98 mmol/L (ref 98–111)
Creatinine, Ser: 1.17 mg/dL — ABNORMAL HIGH (ref 0.44–1.00)
GFR calc Af Amer: >60 mL/min (ref >60)
GFR calc non Af Amer: 55 mL/min — ABNORMAL LOW (ref >60)
Glucose, Bld: 150 mg/dL — ABNORMAL HIGH (ref 70–99)
Potassium: 3.1 mmol/L — ABNORMAL LOW (ref 3.5–5.1)
Sodium: 135 mmol/L (ref 135–145)
Total Bilirubin: 0.7 mg/dL (ref 0.3–1.2)
Total Protein: 7.1 g/dL (ref 6.5–8.1)

## 2018-07-01 LAB — CBC WITH DIFFERENTIAL/PLATELET
Abs Immature Granulocytes: 0 10*3/uL (ref 0.00–0.07)
Basophils Absolute: 0 10*3/uL (ref 0.0–0.1)
Basophils Relative: 0 %
Eosinophils Absolute: 0 10*3/uL (ref 0.0–0.5)
Eosinophils Relative: 0 %
HCT: 19.3 % — ABNORMAL LOW (ref 36.0–46.0)
Hemoglobin: 6.7 g/dL — ABNORMAL LOW (ref 12.0–15.0)
Immature Granulocytes: 0 %
Lymphocytes Relative: 52 %
Lymphs Abs: 0.3 10*3/uL — ABNORMAL LOW (ref 0.7–4.0)
MCH: 29.5 pg (ref 26.0–34.0)
MCHC: 34.7 g/dL (ref 30.0–36.0)
MCV: 85 fL (ref 80.0–100.0)
Monocytes Absolute: 0.2 10*3/uL (ref 0.1–1.0)
Monocytes Relative: 40 %
Neutro Abs: 0 10*3/uL — ABNORMAL LOW (ref 1.7–7.7)
Neutrophils Relative %: 8 %
Platelets: 94 10*3/uL — ABNORMAL LOW (ref 150–400)
RBC: 2.27 MIL/uL — ABNORMAL LOW (ref 3.87–5.11)
RDW: 12.3 % (ref 11.5–15.5)
Smear Review: NORMAL
WBC: 0.5 10*3/uL — CL (ref 4.0–10.5)
nRBC: 0 % (ref 0.0–0.2)

## 2018-07-01 LAB — SAMPLE TO BLOOD BANK

## 2018-07-01 LAB — PREPARE RBC (CROSSMATCH)

## 2018-07-01 MED ORDER — HEPARIN SOD (PORK) LOCK FLUSH 100 UNIT/ML IV SOLN
250.0000 [IU] | Freq: Once | INTRAVENOUS | Status: AC | PRN
  Administered 2018-07-01: 250 [IU]
  Filled 2018-07-01: qty 5

## 2018-07-01 MED ORDER — SODIUM CHLORIDE 0.9% FLUSH
10.0000 mL | Freq: Once | INTRAVENOUS | Status: AC
  Administered 2018-07-01: 10 mL via INTRAVENOUS
  Filled 2018-07-01: qty 10

## 2018-07-01 MED ORDER — SODIUM CHLORIDE 0.9% FLUSH
10.0000 mL | Freq: Once | INTRAVENOUS | Status: DC | PRN
  Filled 2018-07-01: qty 10

## 2018-07-01 MED ORDER — ONDANSETRON HCL 4 MG PO TABS
8.0000 mg | ORAL_TABLET | Freq: Once | ORAL | Status: AC
  Administered 2018-07-01: 8 mg via ORAL
  Filled 2018-07-01: qty 2

## 2018-07-01 MED ORDER — HEPARIN SOD (PORK) LOCK FLUSH 100 UNIT/ML IV SOLN: 500.0000 [IU] | Freq: Once | INTRAVENOUS | Status: DC

## 2018-07-01 MED ORDER — AZACITIDINE CHEMO SQ INJECTION
75.0000 mg/m2 | Freq: Once | INTRAMUSCULAR | Status: AC
  Administered 2018-07-01: 157.5 mg via SUBCUTANEOUS
  Filled 2018-07-01: qty 6.3

## 2018-07-01 MED ORDER — ACETAMINOPHEN 325 MG PO TABS
650.0000 mg | ORAL_TABLET | Freq: Once | ORAL | Status: AC
  Administered 2018-07-01: 650 mg via ORAL
  Filled 2018-07-01: qty 2

## 2018-07-01 MED ORDER — DIPHENHYDRAMINE HCL 25 MG PO CAPS
25.0000 mg | ORAL_CAPSULE | Freq: Once | ORAL | Status: AC
  Administered 2018-07-01: 25 mg via ORAL
  Filled 2018-07-01: qty 1

## 2018-07-01 MED ORDER — SODIUM CHLORIDE 0.9% IV SOLUTION
250.0000 mL | Freq: Once | INTRAVENOUS | Status: AC
  Administered 2018-07-01: 250 mL via INTRAVENOUS
  Filled 2018-07-01: qty 250

## 2018-07-01 MED ORDER — SODIUM CHLORIDE 0.9 % IV SOLN
20.0000 meq | Freq: Once | INTRAVENOUS | Status: AC
  Administered 2018-07-01: 20 meq via INTRAVENOUS
  Filled 2018-07-01: qty 10
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
## 2018-07-01 NOTE — ED Triage Notes (Addendum)
Pt to triage via w/c with no distress noted; pt reports receiving chemo inj and blood transfusion today then developed fever 103 at home; took 60mg  tramadol 2hrs PTA; denies any accomp symptoms but was told to come to ED for evaluation

## 2018-07-02 ENCOUNTER — Inpatient Hospital Stay: Payer: Self-pay

## 2018-07-02 ENCOUNTER — Other Ambulatory Visit: Payer: Self-pay

## 2018-07-02 ENCOUNTER — Emergency Department: Payer: Self-pay

## 2018-07-02 DIAGNOSIS — C9200 Acute myeloblastic leukemia, not having achieved remission: Secondary | ICD-10-CM

## 2018-07-02 DIAGNOSIS — Z825 Family history of asthma and other chronic lower respiratory diseases: Secondary | ICD-10-CM

## 2018-07-02 DIAGNOSIS — Z888 Allergy status to other drugs, medicaments and biological substances status: Secondary | ICD-10-CM

## 2018-07-02 DIAGNOSIS — R5081 Fever presenting with conditions classified elsewhere: Secondary | ICD-10-CM

## 2018-07-02 DIAGNOSIS — D61818 Other pancytopenia: Secondary | ICD-10-CM

## 2018-07-02 DIAGNOSIS — Z20828 Contact with and (suspected) exposure to other viral communicable diseases: Secondary | ICD-10-CM

## 2018-07-02 DIAGNOSIS — Z9221 Personal history of antineoplastic chemotherapy: Secondary | ICD-10-CM

## 2018-07-02 LAB — HEMOGLOBIN A1C
Hgb A1c MFr Bld: 6.6 % — ABNORMAL HIGH (ref 4.8–5.6)
Mean Plasma Glucose: 142.72 mg/dL

## 2018-07-02 LAB — CBC WITH DIFFERENTIAL/PLATELET
Abs Immature Granulocytes: 0 10*3/uL (ref 0.00–0.07)
Basophils Absolute: 0 10*3/uL (ref 0.0–0.1)
Basophils Relative: 0 %
Eosinophils Absolute: 0 10*3/uL (ref 0.0–0.5)
Eosinophils Relative: 0 %
HCT: 21.4 % — ABNORMAL LOW (ref 36.0–46.0)
Hemoglobin: 7.4 g/dL — ABNORMAL LOW (ref 12.0–15.0)
Immature Granulocytes: 0 %
Lymphocytes Relative: 48 %
Lymphs Abs: 0.2 10*3/uL — ABNORMAL LOW (ref 0.7–4.0)
MCH: 29.7 pg (ref 26.0–34.0)
MCHC: 34.6 g/dL (ref 30.0–36.0)
MCV: 85.9 fL (ref 80.0–100.0)
Monocytes Absolute: 0.2 10*3/uL (ref 0.1–1.0)
Monocytes Relative: 46 %
Neutro Abs: 0 10*3/uL — ABNORMAL LOW (ref 1.7–7.7)
Neutrophils Relative %: 6 %
Platelets: 59 10*3/uL — ABNORMAL LOW (ref 150–400)
RBC: 2.49 MIL/uL — ABNORMAL LOW (ref 3.87–5.11)
RDW: 12.3 % (ref 11.5–15.5)
WBC: 0.4 10*3/uL — CL (ref 4.0–10.5)
nRBC: 0 % (ref 0.0–0.2)

## 2018-07-02 LAB — COMPREHENSIVE METABOLIC PANEL
ALT: 13 U/L (ref 0–44)
AST: 11 U/L — ABNORMAL LOW (ref 15–41)
Albumin: 2.9 g/dL — ABNORMAL LOW (ref 3.5–5.0)
Alkaline Phosphatase: 100 U/L (ref 38–126)
Anion gap: 11 (ref 5–15)
BUN: 14 mg/dL (ref 6–20)
CO2: 26 mmol/L (ref 22–32)
Calcium: 8.3 mg/dL — ABNORMAL LOW (ref 8.9–10.3)
Chloride: 95 mmol/L — ABNORMAL LOW (ref 98–111)
Creatinine, Ser: 0.95 mg/dL (ref 0.44–1.00)
GFR calc Af Amer: >60 mL/min (ref >60)
GFR calc non Af Amer: >60 mL/min (ref >60)
Glucose, Bld: 143 mg/dL — ABNORMAL HIGH (ref 70–99)
Potassium: 3.1 mmol/L — ABNORMAL LOW (ref 3.5–5.1)
Sodium: 132 mmol/L — ABNORMAL LOW (ref 135–145)
Total Bilirubin: 1.1 mg/dL (ref 0.3–1.2)
Total Protein: 7.1 g/dL (ref 6.5–8.1)

## 2018-07-02 LAB — URINALYSIS, ROUTINE W REFLEX MICROSCOPIC
Bacteria, UA: NONE SEEN
Bilirubin Urine: NEGATIVE
Glucose, UA: NEGATIVE mg/dL
Ketones, ur: NEGATIVE mg/dL
Leukocytes,Ua: NEGATIVE
Nitrite: NEGATIVE
Protein, ur: NEGATIVE mg/dL
Specific Gravity, Urine: 1.006 (ref 1.005–1.030)
pH: 6 (ref 5.0–8.0)

## 2018-07-02 LAB — TYPE AND SCREEN
ABO/RH(D): O POS
Antibody Screen: NEGATIVE
Unit division: 0
Unit division: 0

## 2018-07-02 LAB — BPAM RBC
Blood Product Expiration Date: 202006162359
Blood Product Expiration Date: 202007082359
ISSUE DATE / TIME: 202006151157
Unit Type and Rh: 5100
Unit Type and Rh: 9500

## 2018-07-02 LAB — TSH: TSH: 0.87 u[IU]/mL (ref 0.350–4.500)

## 2018-07-02 LAB — LACTIC ACID, PLASMA: Lactic Acid, Venous: 1 mmol/L (ref 0.5–1.9)

## 2018-07-02 LAB — SARS CORONAVIRUS 2 BY RT PCR (HOSPITAL ORDER, PERFORMED IN ~~LOC~~ HOSPITAL LAB): SARS Coronavirus 2: NEGATIVE

## 2018-07-02 MED ORDER — BUSPIRONE HCL 5 MG PO TABS
5.0000 mg | ORAL_TABLET | Freq: Two times a day (BID) | ORAL | Status: DC
  Administered 2018-07-02 – 2018-07-07 (×11): 5 mg via ORAL
  Filled 2018-07-02 (×12): qty 1

## 2018-07-02 MED ORDER — ONDANSETRON HCL 4 MG/2ML IJ SOLN
4.0000 mg | Freq: Four times a day (QID) | INTRAMUSCULAR | Status: DC | PRN
  Administered 2018-07-03: 4 mg via INTRAVENOUS
  Filled 2018-07-02: qty 2

## 2018-07-02 MED ORDER — PROCHLORPERAZINE EDISYLATE 10 MG/2ML IJ SOLN
10.0000 mg | Freq: Four times a day (QID) | INTRAMUSCULAR | Status: DC | PRN
  Administered 2018-07-03: 12:00:00 10 mg via INTRAVENOUS
  Filled 2018-07-02 (×2): qty 2

## 2018-07-02 MED ORDER — LIDOCAINE VISCOUS HCL 2 % MT SOLN
5.0000 mL | Freq: Four times a day (QID) | OROMUCOSAL | Status: DC | PRN
  Filled 2018-07-02: qty 15

## 2018-07-02 MED ORDER — CYCLOBENZAPRINE HCL 10 MG PO TABS: 5.0000 mg | ORAL_TABLET | Freq: Every day | ORAL | Status: DC | PRN

## 2018-07-02 MED ORDER — ENSURE ENLIVE PO LIQD
237.0000 mL | Freq: Three times a day (TID) | ORAL | Status: DC
  Administered 2018-07-03 – 2018-07-05 (×6): 237 mL via ORAL

## 2018-07-02 MED ORDER — CARVEDILOL 3.125 MG PO TABS: 6.2500 mg | ORAL_TABLET | Freq: Two times a day (BID) | ORAL | Status: DC

## 2018-07-02 MED ORDER — SODIUM CHLORIDE 0.9 % IV SOLN
2.0000 g | Freq: Once | INTRAVENOUS | Status: AC
  Administered 2018-07-02: 2 g via INTRAVENOUS
  Filled 2018-07-02: qty 2

## 2018-07-02 MED ORDER — METRONIDAZOLE IN NACL 5-0.79 MG/ML-% IV SOLN
500.0000 mg | Freq: Once | INTRAVENOUS | Status: AC
  Administered 2018-07-02: 500 mg via INTRAVENOUS
  Filled 2018-07-02: qty 100

## 2018-07-02 MED ORDER — ACETAMINOPHEN 325 MG PO TABS
650.0000 mg | ORAL_TABLET | Freq: Four times a day (QID) | ORAL | Status: DC | PRN
  Administered 2018-07-02 – 2018-07-06 (×6): 650 mg via ORAL
  Filled 2018-07-02 (×7): qty 2

## 2018-07-02 MED ORDER — POSACONAZOLE 100 MG PO TBEC
100.0000 mg | DELAYED_RELEASE_TABLET | Freq: Three times a day (TID) | ORAL | Status: DC
  Administered 2018-07-02 – 2018-07-07 (×16): 100 mg via ORAL
  Filled 2018-07-02 (×18): qty 1

## 2018-07-02 MED ORDER — MAGIC MOUTHWASH: 5.0000 mL | Freq: Four times a day (QID) | ORAL | Status: DC | PRN

## 2018-07-02 MED ORDER — ONDANSETRON HCL 4 MG PO TABS
4.0000 mg | ORAL_TABLET | Freq: Four times a day (QID) | ORAL | Status: DC | PRN
  Administered 2018-07-05: 4 mg via ORAL
  Filled 2018-07-02: qty 1

## 2018-07-02 MED ORDER — POTASSIUM CHLORIDE IN NACL 40-0.9 MEQ/L-% IV SOLN
INTRAVENOUS | Status: DC
  Administered 2018-07-02 (×2): 125 mL/h via INTRAVENOUS
  Administered 2018-07-04 – 2018-07-07 (×3): 50 mL/h via INTRAVENOUS
  Filled 2018-07-02 (×11): qty 1000

## 2018-07-02 MED ORDER — VANCOMYCIN HCL 1.25 G IV SOLR
1250.0000 mg | INTRAVENOUS | Status: DC
  Administered 2018-07-03 – 2018-07-04 (×2): 1250 mg via INTRAVENOUS
  Filled 2018-07-02 (×2): qty 1250

## 2018-07-02 MED ORDER — VENETOCLAX 100 MG PO TABS
100.0000 mg | ORAL_TABLET | Freq: Every day | ORAL | Status: DC
  Administered 2018-07-02: 18:00:00 100 mg via ORAL
  Filled 2018-07-02: qty 120

## 2018-07-02 MED ORDER — SODIUM CHLORIDE 0.9 % IV SOLN
2.0000 g | Freq: Three times a day (TID) | INTRAVENOUS | Status: DC
  Administered 2018-07-02 – 2018-07-07 (×14): 2 g via INTRAVENOUS
  Filled 2018-07-02 (×19): qty 2

## 2018-07-02 MED ORDER — VANCOMYCIN HCL 10 G IV SOLR
1250.0000 mg | INTRAVENOUS | Status: DC
  Filled 2018-07-02: qty 1250

## 2018-07-02 MED ORDER — ACETAMINOPHEN 325 MG PO TABS
650.0000 mg | ORAL_TABLET | Freq: Once | ORAL | Status: AC
  Administered 2018-07-02: 650 mg via ORAL
  Filled 2018-07-02: qty 2

## 2018-07-02 MED ORDER — VANCOMYCIN HCL IN DEXTROSE 1-5 GM/200ML-% IV SOLN: 1000.0000 mg | Freq: Once | INTRAVENOUS | Status: DC

## 2018-07-02 MED ORDER — ALLOPURINOL 100 MG PO TABS
300.0000 mg | ORAL_TABLET | Freq: Every day | ORAL | Status: DC
  Administered 2018-07-02 – 2018-07-07 (×6): 300 mg via ORAL
  Filled 2018-07-02 (×6): qty 3

## 2018-07-02 MED ORDER — TRAZODONE HCL 50 MG PO TABS
50.0000 mg | ORAL_TABLET | Freq: Every evening | ORAL | Status: DC | PRN
  Administered 2018-07-03 – 2018-07-06 (×2): 50 mg via ORAL
  Filled 2018-07-02 (×2): qty 1

## 2018-07-02 MED ORDER — VALACYCLOVIR HCL 500 MG PO TABS
500.0000 mg | ORAL_TABLET | Freq: Every day | ORAL | Status: DC
  Administered 2018-07-02 – 2018-07-07 (×6): 500 mg via ORAL
  Filled 2018-07-02 (×6): qty 1

## 2018-07-02 MED ORDER — ACETAMINOPHEN 650 MG RE SUPP: 650.0000 mg | Freq: Four times a day (QID) | RECTAL | Status: DC | PRN

## 2018-07-02 MED ORDER — METRONIDAZOLE IN NACL 5-0.79 MG/ML-% IV SOLN
500.0000 mg | Freq: Three times a day (TID) | INTRAVENOUS | Status: DC
  Administered 2018-07-02 – 2018-07-05 (×10): 500 mg via INTRAVENOUS
  Filled 2018-07-02 (×13): qty 100

## 2018-07-02 MED ORDER — CHLORHEXIDINE GLUCONATE 0.12 % MT SOLN
15.0000 mL | Freq: Two times a day (BID) | OROMUCOSAL | Status: DC
  Administered 2018-07-02 – 2018-07-07 (×6): 15 mL via OROMUCOSAL
  Filled 2018-07-02 (×8): qty 15

## 2018-07-02 MED ORDER — PANTOPRAZOLE SODIUM 40 MG PO TBEC
40.0000 mg | DELAYED_RELEASE_TABLET | Freq: Every day | ORAL | Status: DC
  Administered 2018-07-02 – 2018-07-07 (×6): 40 mg via ORAL
  Filled 2018-07-02 (×6): qty 1

## 2018-07-02 MED ORDER — SODIUM CHLORIDE 0.9 % IV SOLN
Freq: Once | INTRAVENOUS | Status: AC
  Administered 2018-07-02: 02:00:00 via INTRAVENOUS

## 2018-07-02 MED ORDER — METOPROLOL SUCCINATE ER 25 MG PO TB24
25.0000 mg | ORAL_TABLET | Freq: Every day | ORAL | Status: DC
  Administered 2018-07-02: 25 mg via ORAL
  Filled 2018-07-02: qty 1

## 2018-07-02 MED ORDER — LOSARTAN POTASSIUM 25 MG PO TABS
12.5000 mg | ORAL_TABLET | Freq: Every day | ORAL | Status: DC
  Administered 2018-07-02 – 2018-07-07 (×6): 12.5 mg via ORAL
  Filled 2018-07-02 (×6): qty 1

## 2018-07-02 MED ORDER — DOCUSATE SODIUM 100 MG PO CAPS
100.0000 mg | ORAL_CAPSULE | Freq: Two times a day (BID) | ORAL | Status: DC
  Administered 2018-07-02 – 2018-07-05 (×3): 100 mg via ORAL
  Filled 2018-07-02 (×5): qty 1

## 2018-07-02 MED ORDER — MUPIROCIN 2 % EX OINT
1.0000 "application " | TOPICAL_OINTMENT | Freq: Three times a day (TID) | CUTANEOUS | Status: DC
  Administered 2018-07-03 (×3): 1 via NASAL
  Filled 2018-07-02: qty 22

## 2018-07-02 MED ORDER — CARVEDILOL 3.125 MG PO TABS
6.2500 mg | ORAL_TABLET | Freq: Two times a day (BID) | ORAL | Status: DC
  Administered 2018-07-02 – 2018-07-07 (×9): 6.25 mg via ORAL
  Filled 2018-07-02 (×9): qty 2

## 2018-07-02 MED ORDER — MAGIC MOUTHWASH W/LIDOCAINE: 5.0000 mL | Freq: Four times a day (QID) | ORAL | Status: DC | PRN

## 2018-07-02 MED ORDER — VENLAFAXINE HCL ER 75 MG PO CP24
300.0000 mg | ORAL_CAPSULE | Freq: Every day | ORAL | Status: DC
  Administered 2018-07-02 – 2018-07-07 (×6): 300 mg via ORAL
  Filled 2018-07-02 (×6): qty 4

## 2018-07-02 MED ORDER — VANCOMYCIN HCL 10 G IV SOLR
1750.0000 mg | Freq: Once | INTRAVENOUS | Status: AC
  Administered 2018-07-02: 1750 mg via INTRAVENOUS
  Filled 2018-07-02: qty 1750

## 2018-07-02 NOTE — H&P (Signed)
Rhonda Kane is an 48 y.o. female.   Chief Complaint: Fever HPI: The patient with past medical history of AML presents to the emergency department complaining of fever.  The patient received a blood transfusion yesterday as well as chemo.  She felt tired per usual following treatment and went to bed.  Her son who helps with her care checked her temperature and was found to be 103F.  In the emergency department the patient received Tylenol and was observed for some time but developed sepsis criteria which prompted the emergency department staff to call hospitalist service for admission.  Past Medical History:  Diagnosis Date  . Breast mass, right 2015  . Cancer ([place])    AML  . Depression   . Dysmenorrhea   . GERD (gastroesophageal reflux disease)   . Headache    MIGRAINES  . Insomnia   . Insulin resistance   . Irregular menses   . Leukemia ([place])   . Menometrorrhagia   . Migraine with aura   . PMS (premenstrual syndrome)   . PONV (postoperative nausea and vomiting)     Past Surgical History:  Procedure Laterality Date  . ANKLE ARTHROSCOPY Right 05/07/2015   Procedure: ANKLE ARTHROSCOPY  / WITH TENOLYSIS;  Surgeon: [name], DPM;  Location: [facility] ORS;  Service: Podiatry;  Laterality: Right;  right ankle arthroscopy with debridement, osteochondral repair, tenolysis of posterior tibial tendon  . BREAST FIBROADENOMA SURGERY Right 03/10/2013  . CARPAL TUNNEL RELEASE Left 09/11/2014   Procedure: CARPAL TUNNEL RELEASE;  Surgeon: Christophe Louis, MD;  Location: [facility] ORS;  Service: Orthopedics;  Laterality: Left;  . CHOLECYSTECTOMY      Family History  Problem Relation Age of Onset  . COPD Mother   . COPD Father    Social History:  reports that she has never smoked. She has never used smokeless tobacco. She reports that she does not drink alcohol or use drugs.  Allergies: No Known Allergies  Medications Prior to Admission  Medication Sig Dispense Refill  . allopurinol  (ZYLOPRIM) 300 MG tablet Take 300 mg by mouth daily.     . busPIRone (BUSPAR) 5 MG tablet Take 1 tablet (5 mg total) by mouth 2 (two) times daily. 60 tablet 0  . carvedilol (COREG) 6.25 MG tablet Take 6.25 mg by mouth 2 (two) times daily.    . chlorhexidine (PERIDEX) 0.12 % solution Use as directed 15 mLs in the mouth or throat 2 (two) times daily. 120 mL 3  . cyclobenzaprine (FLEXERIL) 5 MG tablet Take 1 tablet (5 mg total) by mouth daily as needed for muscle spasms. 30 tablet 0  . ibuprofen (ADVIL,MOTRIN) 200 MG tablet Take 600 mg by mouth daily as needed for fever, headache or mild pain.     Marland Kitchen levofloxacin (LEVAQUIN) 500 MG tablet Take 500 mg by mouth daily.    Marland Kitchen lidocaine-prilocaine (EMLA) cream Apply to affected area once 30 g 3  . losartan (COZAAR) 25 MG tablet Take 12.5 mg by mouth daily.    . magic mouthwash w/lidocaine SOLN Take 5 mLs by mouth 4 (four) times daily as needed for mouth pain. 480 mL 3  . metoprolol succinate (TOPROL-XL) 25 MG 24 hr tablet Take 25 mg by mouth daily.    . mupirocin ointment (BACTROBAN) 2 % Place 1 application into the nose 3 (three) times daily. 30 g 0  . ondansetron (ZOFRAN) 8 MG tablet Take 1 tablet (8 mg total) by mouth 2 (two) times daily as needed (Nausea  or vomiting). 30 tablet 1  . pantoprazole (PROTONIX) 40 MG tablet Take 40 mg by mouth daily.    . posaconazole (NOXAFIL) 100 MG TBEC delayed-release tablet Take 100 mg by mouth 3 (three) times daily.     . Potassium 99 MG TABS Take 99 mg by mouth daily.    . prochlorperazine (COMPAZINE) 10 MG tablet Take 1 tablet (10 mg total) by mouth every 6 (six) hours as needed (Nausea or vomiting). 30 tablet 1  . traMADol (ULTRAM) 50 MG tablet Take 50 mg by mouth every 6 (six) hours as needed for moderate pain.     . traZODone (DESYREL) 50 MG tablet Take 50 mg by mouth at bedtime as needed for sleep.     . valACYclovir (VALTREX) 500 MG tablet Take 500 mg by mouth daily.    Marland Kitchen venetoclax 100 MG TABS Take 100 mg by  mouth daily. Take with a meal and water. Do not chew, crush, or break tablets.    . venlafaxine XR (EFFEXOR-XR) 150 MG 24 hr capsule Take 2 capsules (300 mg total) by mouth daily. 30 capsule 0  . clindamycin (CLEOCIN) 150 MG capsule Take 2 capsules (300 mg total) by mouth 3 (three) times daily. (Patient not taking: Reported on 07/01/2018) 42 capsule 0    Results for orders placed or performed during the hospital encounter of 07/01/18 (from the past 48 hour(s))  CBC with Differential     Status: Abnormal   Collection Time: 07/01/18 11:47 PM  Result Value Ref Range   WBC 0.4 (LL) 4.0 - 10.5 K/uL    Comment: REPEATED TO VERIFY CRITICAL VALUE NOTED.  VALUE IS CONSISTENT WITH PREVIOUSLY REPORTED AND CALLED VALUE.    RBC 2.49 (L) 3.87 - 5.11 MIL/uL   Hemoglobin 7.4 (L) 12.0 - 15.0 g/dL   HCT 21.4 (L) 36.0 - 46.0 %   MCV 85.9 80.0 - 100.0 fL   MCH 29.7 26.0 - 34.0 pg   MCHC 34.6 30.0 - 36.0 g/dL   RDW 12.3 11.5 - 15.5 %   Platelets 59 (L) 150 - 400 K/uL    Comment: PLATELET COUNT CONFIRMED BY SMEAR Immature Platelet Fraction may be clinically indicated, consider ordering this additional test [id number]    nRBC 0.0 0.0 - 0.2 %   Neutrophils Relative % 6 %   Neutro Abs 0.0 (L) 1.7 - 7.7 K/uL   Lymphocytes Relative 48 %   Lymphs Abs 0.2 (L) 0.7 - 4.0 K/uL   Monocytes Relative 46 %   Monocytes Absolute 0.2 0.1 - 1.0 K/uL   Eosinophils Relative 0 %   Eosinophils Absolute 0.0 0.0 - 0.5 K/uL   Basophils Relative 0 %   Basophils Absolute 0.0 0.0 - 0.1 K/uL   WBC Morphology TO FEW TO COUNT, SLIDE AVAILABLE FOR REVIEW    RBC Morphology MORPHOLOGY UNREMARKABLE    Immature Granulocytes 0 %   Abs Immature Granulocytes 0.00 0.00 - 0.07 K/uL    Comment: Performed at [facility], [address] [place], [place] [address]  Comprehensive metabolic panel     Status: Abnormal   Collection Time: 07/01/18 11:47 PM  Result Value Ref Range   Sodium 132 (L) 135 - 145 mmol/L   Potassium 3.1  (L) 3.5 - 5.1 mmol/L   Chloride 95 (L) 98 - 111 mmol/L   CO2 26 22 - 32 mmol/L   Glucose, Bld 143 (H) 70 - 99 mg/dL   BUN 14 6 - 20 mg/dL   Creatinine,  Ser 0.95 0.44 - 1.00 mg/dL   Calcium 8.3 (L) 8.9 - 10.3 mg/dL   Total Protein 7.1 6.5 - 8.1 g/dL   Albumin 2.9 (L) 3.5 - 5.0 g/dL   AST 11 (L) 15 - 41 U/L   ALT 13 0 - 44 U/L   Alkaline Phosphatase 100 38 - 126 U/L   Total Bilirubin 1.1 0.3 - 1.2 mg/dL   GFR calc non Af Amer >60 >60 mL/min   GFR calc Af Amer >60 >60 mL/min   Anion gap 11 5 - 15    Comment: Performed at [facility], [address] [place], [place] [address]  Lactic acid, plasma     Status: None   Collection Time: 07/01/18 11:47 PM  Result Value Ref Range   Lactic Acid, Venous 1.0 0.5 - 1.9 mmol/L    Comment: Performed at [facility], [address] [place], [place] [address]  TSH     Status: None   Collection Time: 07/01/18 11:47 PM  Result Value Ref Range   TSH 0.870 0.350 - 4.500 uIU/mL    Comment: Performed by a 3rd Generation assay with a functional sensitivity of <=0.01 uIU/mL. Performed at [facility], [address] [place], [place] [address]   SARS Coronavirus 2 (CEPHEID- Performed in [facility] hospital lab), Hosp Order     Status: None   Collection Time: 07/02/18 12:24 AM   Specimen: Nasopharyngeal Swab  Result Value Ref Range   SARS Coronavirus 2 NEGATIVE NEGATIVE    Comment: (NOTE) If result is NEGATIVE SARS-CoV-2 target nucleic acids are NOT DETECTED. The SARS-CoV-2 RNA is generally detectable in upper and lower  respiratory specimens during the acute phase of infection. The lowest  concentration of SARS-CoV-2 viral copies this assay can detect is 250  copies / mL. A negative result does not preclude SARS-CoV-2 infection  and should not be used as the sole basis for treatment or other  patient management decisions.  A negative result may occur with  improper specimen collection / handling, submission of  specimen other  than nasopharyngeal swab, presence of viral mutation(s) within the  areas targeted by this assay, and inadequate number of viral copies  (<250 copies / mL). A negative result must be combined with clinical  observations, patient history, and epidemiological information. If result is POSITIVE SARS-CoV-2 target nucleic acids are DETECTED. The SARS-CoV-2 RNA is generally detectable in upper and lower  respiratory specimens dur ing the acute phase of infection.  Positive  results are indicative of active infection with SARS-CoV-2.  Clinical  correlation with patient history and other diagnostic information is  necessary to determine patient infection status.  Positive results do  not rule out bacterial infection or co-infection with other viruses. If result is PRESUMPTIVE POSTIVE SARS-CoV-2 nucleic acids MAY BE PRESENT.   A presumptive positive result was obtained on the submitted specimen  and confirmed on repeat testing.  While 2019 novel coronavirus  (SARS-CoV-2) nucleic acids may be present in the submitted sample  additional confirmatory testing may be necessary for epidemiological  and / or clinical management purposes  to differentiate between  SARS-CoV-2 and other Sarbecovirus currently known to infect humans.  If clinically indicated additional testing with an alternate test  methodology [phone]) is advised. The SARS-CoV-2 RNA is generally  detectable in upper and lower respiratory sp ecimens during the acute  phase of infection. The expected result is Negative. Fact Sheet for Patients:  StrictlyIdeas.no Fact Sheet for Healthcare  Providers: BankingDealers.co.za This test is not yet approved or cleared by the [place] and has been authorized for detection and/or diagnosis of SARS-CoV-2 by FDA under an Emergency Use Authorization (EUA).  This EUA will remain in effect (meaning this test can be used) for the  duration of the COVID-19 declaration under Section 564(b)(1) of the Act, 21 U.S.C. section 360bbb-3(b)(1), unless the authorization is terminated or revoked sooner. Performed at [facility], [address] [place], [place] [address]   Blood culture (routine x 2)     Status: None (Preliminary result)   Collection Time: 07/02/18 12:24 AM   Specimen: BLOOD  Result Value Ref Range   Specimen Description BLOOD BLOOD LEFT HAND    Special Requests      BOTTLES DRAWN AEROBIC AND ANAEROBIC Blood Culture adequate volume   Culture      NO GROWTH < 12 HOURS Performed at [facility], [address], [place], [place] [address]    Report Status PENDING   Blood culture (routine x 2)     Status: None (Preliminary result)   Collection Time: 07/02/18 12:24 AM   Specimen: BLOOD  Result Value Ref Range   Specimen Description BLOOD LEFT ANTECUBITAL    Special Requests      BOTTLES DRAWN AEROBIC AND ANAEROBIC Blood Culture results may not be optimal due to an excessive volume of blood received in culture bottles   Culture      NO GROWTH < 12 HOURS Performed at [facility], [address] [place], [place] [address]    Report Status PENDING   Urinalysis, Routine w reflex microscopic     Status: Abnormal   Collection Time: 07/02/18  5:41 AM  Result Value Ref Range   Color, Urine YELLOW (A) YELLOW   APPearance CLEAR (A) CLEAR   Specific Gravity, Urine 1.006 1.005 - 1.030   pH 6.0 5.0 - 8.0   Glucose, UA NEGATIVE NEGATIVE mg/dL   Hgb urine dipstick SMALL (A) NEGATIVE   Bilirubin Urine NEGATIVE NEGATIVE   Ketones, ur NEGATIVE NEGATIVE mg/dL   Protein, ur NEGATIVE NEGATIVE mg/dL   Nitrite NEGATIVE NEGATIVE   Leukocytes,Ua NEGATIVE NEGATIVE   RBC / HPF 0-5 0 - 5 RBC/hpf   WBC, UA 0-5 0 - 5 WBC/hpf   Bacteria, UA NONE SEEN NONE SEEN   Squamous Epithelial / LPF 0-5 0 - 5   Mucus PRESENT     Comment: Performed at [facility], [address],  [place], [place] [address]   Dg Chest Portable 1 View  Result Date: 07/02/2018 CLINICAL DATA:  Shortness of breath EXAM: PORTABLE CHEST 1 VIEW COMPARISON:  04/10/2018 FINDINGS: Right PICC line remains in place with the tip in the SVC. Heart and mediastinal contours are within normal limits. No focal opacities or effusions. No acute bony abnormality. IMPRESSION: No active disease. Electronically Signed   By: Rolm Baptise M.D.   On: 07/02/2018 01:32    Review of Systems  Constitutional: Positive for fever. Negative for chills.  HENT: Negative for sore throat and tinnitus.   Eyes: Negative for blurred vision and redness.  Respiratory: Negative for cough and shortness of breath.   Cardiovascular: Negative for chest pain, palpitations, orthopnea and PND.  Gastrointestinal: Negative for abdominal pain, diarrhea, nausea and vomiting.  Genitourinary: Negative for dysuria, frequency and urgency.  Musculoskeletal: Negative for joint pain and myalgias.  Skin: Negative for rash.       No lesions  Neurological: Negative  for speech change, focal weakness and weakness.  Endo/Heme/Allergies: Does not bruise/bleed easily.       No temperature intolerance  Psychiatric/Behavioral: Negative for depression and suicidal ideas.    Blood pressure 109/67, pulse 99, temperature 98.2 F (36.8 C), temperature source Oral, resp. rate 19, height 5\' 4"  (1.626 m), weight 95.1 kg, SpO2 95 %. Physical Exam  Vitals reviewed. Constitutional: She is oriented to person, place, and time. She appears well-developed and well-nourished. No distress.  HENT:  Head: Normocephalic and atraumatic.  Mouth/Throat: Oropharynx is clear and moist.  Eyes: Pupils are equal, round, and reactive to light. Conjunctivae and EOM are normal. No scleral icterus.  Neck: Normal range of motion. Neck supple. No JVD present. No tracheal deviation present. No thyromegaly present.  Cardiovascular: Normal rate, regular rhythm and normal heart sounds. Exam  reveals no gallop and no friction rub.  No murmur heard. Respiratory: Effort normal and breath sounds normal.  GI: Soft. Bowel sounds are normal. She exhibits no distension. There is no abdominal tenderness.  Genitourinary:    Genitourinary Comments: Deferred   Musculoskeletal: Normal range of motion.        General: No edema.  Lymphadenopathy:    She has no cervical adenopathy.  Neurological: She is alert and oriented to person, place, and time. No cranial nerve deficit. She exhibits normal muscle tone.  Skin: Skin is warm and dry. No rash noted. No erythema.  Psychiatric: She has a normal mood and affect. Her behavior is normal. Judgment and thought content normal.     Assessment/Plan This is a 48 year old female admitted for neutropenic fever. 1.  Neutropenic fever: ANC is 400; now afebrile.  Continue cefepime and vancomycin.  Consult oncology 2.  Sepsis: The patient meets criteria via fever, tachycardia, intermittent tachypnea and leukopenia.  She is hemodynamically stable.  Continue to hydrate with intravenous fluid.  Follow blood cultures as well as urine cultures for growth and sensitivities.  Source is unclear.  Lungs are clear and no indication of urinary tract infection.  The patient has mild abdominal pain at the site of chemotherapy injection but no severe abdominal pain to suggest typhlitis.  Continue to monitor.  Also continue posaconazole for fungal prophylaxis 3.  AML: Pancytopenia.  Continue immunotherapy 4.  Hypokalemia: Replete potassium 5.  Hypertension: Continue carvedilol, losartan and metoprolol XL 6.  Viral prophylaxis: Continue valacyclovir 7.  DVT prophylaxis: SCDs (no pharmacologic prophylaxis due to thrombocytopenia) 8.  GI prophylaxis: Pantoprazole The patient is a full code.  Time spent on admission orders and patient care approximately 45 minutes  Harrie Foreman, MD 07/02/2018, 7:00 AM

## 2018-07-02 NOTE — Plan of Care (Signed)

## 2018-07-02 NOTE — ED Provider Notes (Signed)
[facility] Emergency Department Provider Note   I have reviewed the triage vital signs and the nursing notes.   HISTORY  Chief Complaint Fever    HPI Rhonda Kane is a 48 y.o. 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  Patient states that temperature at home was 103.   Patient states that she has had fever following blood transfusions in the past.  Patient denies any other symptoms no nausea vomiting diarrhea constipation abdominal pain.  Patient denies any coughing.  Patient denies any urinary symptoms.  Of note the patient's blood transfusion was 10 AM yesterday morning       Past Medical History:  Diagnosis Date   Breast mass, right 2015   Cancer ([place])    AML   Depression    Dysmenorrhea    GERD (gastroesophageal reflux disease)    Headache    MIGRAINES   Insomnia    Insulin resistance    Irregular menses    Leukemia (HCC)    Menometrorrhagia    Migraine with aura    PMS (premenstrual syndrome)    PONV (postoperative nausea and vomiting)     Patient Active Problem List   Diagnosis Date Noted   Dehydration 07/01/2018   Neutropenic fever ([place])    Hypokalemia 03/21/2018   Encounter for antineoplastic chemotherapy 03/18/2018   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   Anemia due to antineoplastic chemotherapy 02/14/2018   Goals of care, counseling/discussion 02/09/2018   Pancytopenia ([place]) 02/02/2018   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[place])    Anemia    Thrombocytopenia (HCC)    Neutropenia (HCC)    Symptomatic anemia 12/13/2017    Past Surgical History:  Procedure Laterality Date   ANKLE ARTHROSCOPY Right 05/07/2015   Procedure: ANKLE ARTHROSCOPY  / WITH TENOLYSIS;  Surgeon: [name], DPM;  Location: [facility] ORS;  Service: Podiatry;  Laterality:  Right;  right ankle arthroscopy with debridement, osteochondral repair, tenolysis of posterior tibial tendon   BREAST FIBROADENOMA SURGERY Right 03/10/2013   CARPAL TUNNEL RELEASE Left 09/11/2014   Procedure: CARPAL TUNNEL RELEASE;  Surgeon: Christophe Louis, MD;  Location: [facility] ORS;  Service: Orthopedics;  Laterality: Left;   CHOLECYSTECTOMY      Prior to Admission medications   Medication Sig Start Date End Date Taking? Authorizing Provider  allopurinol (ZYLOPRIM) 300 MG tablet Take 300 mg by mouth daily.  04/18/18 04/18/19 Yes [provider]  busPIRone (BUSPAR) 5 MG tablet Take 1 tablet (5 mg total) by mouth 2 (two) times daily. 04/14/18  Yes Gouru, Illene Silver, MD  carvedilol (COREG) 6.25 MG tablet Take 6.25 mg by mouth 2 (two) times daily. 01/26/18  Yes [provider]  chlorhexidine (PERIDEX) 0.12 % solution Use as directed 15 mLs in the mouth or throat 2 (two) times daily. 06/07/18  Yes Earlie Server, MD  cyclobenzaprine (FLEXERIL) 5 MG tablet Take 1 tablet (5 mg total) by mouth daily as needed for muscle spasms. 05/28/18  Yes Earlie Server, MD  ibuprofen (ADVIL,MOTRIN) 200 MG tablet Take 600 mg by mouth daily as needed for fever, headache or mild pain.    Yes [provider]  levofloxacin (LEVAQUIN) 500 MG tablet Take 500 mg by mouth daily. 03/06/18  Yes [provider]  lidocaine-prilocaine (EMLA) cream Apply to affected area once 05/23/18  Yes Earlie Server, MD  losartan (COZAAR) 25 MG tablet Take 12.5  mg by mouth daily. 01/26/18  Yes [provider]  magic mouthwash w/lidocaine SOLN Take 5 mLs by mouth 4 (four) times daily as needed for mouth pain. 06/03/18  Yes Earlie Server, MD  metoprolol succinate (TOPROL-XL) 25 MG 24 hr tablet Take 25 mg by mouth daily. 04/18/18 04/18/19 Yes [provider]  mupirocin ointment (BACTROBAN) 2 % Place 1 application into the nose 3 (three) times daily. 06/24/18  Yes Earlie Server, MD  ondansetron (ZOFRAN) 8 MG tablet Take 1 tablet (8 mg  total) by mouth 2 (two) times daily as needed (Nausea or vomiting). 06/07/18  Yes Earlie Server, MD  pantoprazole (PROTONIX) 40 MG tablet Take 40 mg by mouth daily. 01/26/18  Yes [provider]  posaconazole (NOXAFIL) 100 MG TBEC delayed-release tablet Take 100 mg by mouth 3 (three) times daily.  01/26/18  Yes [provider]  Potassium 99 MG TABS Take 99 mg by mouth daily.   Yes [provider]  prochlorperazine (COMPAZINE) 10 MG tablet Take 1 tablet (10 mg total) by mouth every 6 (six) hours as needed (Nausea or vomiting). 06/03/18  Yes Earlie Server, MD  traMADol (ULTRAM) 50 MG tablet Take 50 mg by mouth every 6 (six) hours as needed for moderate pain.  06/20/18  Yes [provider]  traZODone (DESYREL) 50 MG tablet Take 50 mg by mouth at bedtime as needed for sleep.    Yes [provider]  valACYclovir (VALTREX) 500 MG tablet Take 500 mg by mouth daily. 01/26/18  Yes [provider]  venetoclax 100 MG TABS Take 100 mg by mouth daily. Take with a meal and water. Do not chew, crush, or break tablets. 05/01/18  Yes [provider]  venlafaxine XR (EFFEXOR-XR) 150 MG 24 hr capsule Take 2 capsules (300 mg total) by mouth daily. 04/15/18  Yes Gouru, Illene Silver, MD  clindamycin (CLEOCIN) 150 MG capsule Take 2 capsules (300 mg total) by mouth 3 (three) times daily. Patient not taking: Reported on 07/01/2018 06/08/18   Versie Starks, PA-C    Allergies Patient has no known allergies.  Family History  Problem Relation Age of Onset   COPD Mother    COPD Father     Social History Social History   Tobacco Use   Smoking status: Never Smoker   Smokeless tobacco: Never Used  Substance Use Topics   Alcohol use: No   Drug use: No    Review of Systems Constitutional: Positive for fever Eyes: No visual changes. ENT: No sore throat. Cardiovascular: Denies chest pain. Respiratory: Denies shortness of breath. Gastrointestinal: No abdominal pain.  No  nausea, no vomiting.  No diarrhea.  No constipation. Genitourinary: Negative for dysuria. Musculoskeletal: Negative for neck pain.  Negative for back pain. Integumentary: Negative for rash. Neurological: Negative for headaches, focal weakness or numbness.   ____________________________________________   PHYSICAL EXAM:  VITAL SIGNS: ED Triage Vitals [07/01/18 2320]  Enc Vitals Group     BP 108/70     Pulse Rate (!) 114     Resp 20     Temp 98.6 F (37 C)     Temp Source Oral     SpO2 95 %     Weight 94.8 kg (208 lb 15.9 oz)     Height 1.626 m ('5\' 4"'$ )     Head Circumference      Peak Flow      Pain Score 0     Pain Loc      Pain Edu?  Excl. in [place]?     Constitutional: Alert and oriented. Well appearing and in no acute distress. Eyes: Conjunctivae are normal. Mouth/Throat: Mucous membranes are moist.  Oropharynx non-erythematous. Neck: No stridor.  No meningeal signs.  Cardiovascular: Normal rate, regular rhythm. Good peripheral circulation. Grossly normal heart sounds. Respiratory: Normal respiratory effort.  No retractions. No audible wheezing. Gastrointestinal: Soft and nontender. No distention.  Musculoskeletal: No lower extremity tenderness nor edema. No gross deformities of extremities. Neurologic:  Normal speech and language. No gross focal neurologic deficits are appreciated.  Skin:  Skin is warm, dry and intact. No rash noted. Psychiatric: Mood and affect are normal. Speech and behavior are normal.  ____________________________________________   LABS (all labs ordered are listed, but only abnormal results are displayed)  Labs Reviewed  CBC WITH DIFFERENTIAL/PLATELET - Abnormal; Notable for the following components:      Result Value   WBC 0.4 (*)    RBC 2.49 (*)    Hemoglobin 7.4 (*)    HCT 21.4 (*)    Platelets 59 (*)    Neutro Abs 0.0 (*)    Lymphs Abs 0.2 (*)    All other components within normal limits  COMPREHENSIVE METABOLIC PANEL - Abnormal;  Notable for the following components:   Sodium 132 (*)    Potassium 3.1 (*)    Chloride 95 (*)    Glucose, Bld 143 (*)    Calcium 8.3 (*)    Albumin 2.9 (*)    AST 11 (*)    All other components within normal limits  SARS CORONAVIRUS 2 (HOSPITAL ORDER, [place] [facility])  CULTURE, BLOOD (ROUTINE X 2)  CULTURE, BLOOD (ROUTINE X 2)  LACTIC ACID, PLASMA  TSH  URINALYSIS, ROUTINE W REFLEX MICROSCOPIC  HEMOGLOBIN A1C   ____________________________________________  EKG  ED ECG REPORT I, [place] N Avis Tirone, the attending physician, personally viewed and interpreted this ECG.   Date: 07/02/2018  EKG Time: 12:24 AM  Rate: 103  Rhythm: Sinus tachycardia  Axis: Normal  Intervals: Normal  ST&T Change: None  ____________________________________________  RADIOLOGY I, [place] N Khalaya Mcgurn, personally viewed and evaluated these images (plain radiographs) as part of my medical decision making, as well as reviewing the written report by the radiologist.  ED MD interpretation: No active disease noted on chest x-ray  Official radiology report(s): Dg Chest Portable 1 View  Result Date: 07/02/2018 CLINICAL DATA:  Shortness of breath EXAM: PORTABLE CHEST 1 VIEW COMPARISON:  04/10/2018 FINDINGS: Right PICC line remains in place with the tip in the SVC. Heart and mediastinal contours are within normal limits. No focal opacities or effusions. No acute bony abnormality. IMPRESSION: No active disease. Electronically Signed   By: Rolm Baptise M.D.   On: 07/02/2018 01:32      Procedures   ____________________________________________   INITIAL IMPRESSION / MDM / [place] / ED COURSE  As part of my medical decision making, I reviewed the following data within the electronic MEDICAL RECORD NUMBER   47 year old female presenting with above-stated history and physical exam secondary to fever following chemotherapy and blood transfusion today.  Considered possibly of a  transfusion reaction however patient still currently febrile despite the fact transfusion was greater than 14 hours ago.  Patient met sepsis criteria on arrival tachycardic tachypneic and febrile with white blood cell count of 0.4.  As such sepsis protocol was initiated.  Patient given Tylenol.  Patient also given broad-spectrum IV antibiotic coverage.  Patient discussed with Dr. Marcille Blanco  for hospital admission for further evaluation and management.  *Rhonda Kane was evaluated in Emergency Department on 07/02/2018 for the symptoms described in the history of present illness. She was evaluated in the context of the global COVID-19 pandemic, which necessitated consideration that the patient might be at risk for infection with the SARS-CoV-2 virus that causes COVID-19. Institutional protocols and algorithms that pertain to the evaluation of patients at risk for COVID-19 are in a state of rapid change based on information released by regulatory bodies including the CDC and federal and state organizations. These policies and algorithms were followed during the patient's care in the ED.  Some ED evaluations and interventions may be delayed as a result of limited staffing during the pandemic.*    ____________________________________________  FINAL CLINICAL IMPRESSION(S) / ED DIAGNOSES  Neutropenic fever Sepsis.  MEDICATIONS GIVEN DURING THIS VISIT:  Medications  allopurinol (ZYLOPRIM) tablet 300 mg (has no administration in time range)  valACYclovir (VALTREX) tablet 500 mg (has no administration in time range)  0.9 % NaCl with KCl 40 mEq / L  infusion (125 mL/hr Intravenous New Bag/Given 07/02/18 0516)  acetaminophen (TYLENOL) tablet 650 mg (has no administration in time range)    Or  acetaminophen (TYLENOL) suppository 650 mg (has no administration in time range)  docusate sodium (COLACE) capsule 100 mg (has no administration in time range)  ondansetron (ZOFRAN) tablet 4 mg (has no administration  in time range)    Or  ondansetron (ZOFRAN) injection 4 mg (has no administration in time range)  posaconazole (NOXAFIL) delayed-release tablet 100 mg (has no administration in time range)  venetoclax TABS 100 mg (has no administration in time range)  carvedilol (COREG) tablet 6.25 mg (has no administration in time range)  losartan (COZAAR) tablet 12.5 mg (has no administration in time range)  metoprolol succinate (TOPROL-XL) 24 hr tablet 25 mg (has no administration in time range)  traZODone (DESYREL) tablet 50 mg (has no administration in time range)  venlafaxine XR (EFFEXOR-XR) 24 hr capsule 300 mg (has no administration in time range)  pantoprazole (PROTONIX) EC tablet 40 mg (has no administration in time range)  cyclobenzaprine (FLEXERIL) tablet 5 mg (has no administration in time range)  chlorhexidine (PERIDEX) 0.12 % solution 15 mL (has no administration in time range)  mupirocin ointment (BACTROBAN) 2 % 1 application (has no administration in time range)  busPIRone (BUSPAR) tablet 5 mg (has no administration in time range)  prochlorperazine (COMPAZINE) injection 10 mg (has no administration in time range)  magic mouthwash (has no administration in time range)    And  lidocaine (XYLOCAINE) 2 % viscous mouth solution 5 mL (has no administration in time range)  acetaminophen (TYLENOL) tablet 650 mg (650 mg Oral Given 07/02/18 0039)  ceFEPIme (MAXIPIME) 2 g in sodium chloride 0.9 % 100 mL IVPB (0 g Intravenous Stopped 07/02/18 0134)  metroNIDAZOLE (FLAGYL) IVPB 500 mg (0 mg Intravenous Stopped 07/02/18 0240)  vancomycin (VANCOCIN) 1,750 mg in sodium chloride 0.9 % 500 mL IVPB ( Intravenous Restarted 07/02/18 0510)  0.9 %  sodium chloride infusion ( Intravenous Stopped 07/02/18 0338)     ED Discharge Orders    None       Note:  This document was prepared using Dragon voice recognition software and may include unintentional dictation errors.   Gregor Hams, MD 07/02/18 [phone]

## 2018-07-02 NOTE — Progress Notes (Signed)
CODE SEPSIS - PHARMACY COMMUNICATION  **Broad Spectrum Antibiotics should be administered within 1 hour of Sepsis diagnosis**  Time Code Sepsis Called/Page Received: 4801  Antibiotics Ordered: vanc/cefepime/flagyl  Time of 1st antibiotic administration: 0058  Additional action taken by pharmacy:   If necessary, Name of Provider/Nurse Contacted:     Tobie Lords ,PharmD Clinical Pharmacist  07/02/2018  1:31 AM

## 2018-07-02 NOTE — ED Notes (Signed)
ED TO INPATIENT HANDOFF REPORT  ED Nurse Name and Phone #: Anda Kraft [phone]  S Name/Age/Gender Rhonda Kane 48 y.o. female Room/Bed: ED03A/ED03A  Code Status   Code Status: Prior  Home/SNF/Other Home Patient oriented to: self, place, time and situation Is this baseline? Yes   Triage Complete: Triage complete  Chief Complaint Fever  Triage Note Pt to triage via w/c with no distress noted; pt reports receiving chemo inj and blood transfusion today then developed fever 103 at home; took 60mg  tramadol 2hrs PTA; denies any accomp symptoms but was told to come to ED for evaluation   Allergies No Known Allergies  Level of Care/Admitting Diagnosis ED Disposition    ED Disposition Condition [place]: [place] [[id number]]  Level of Care: Med-Surg [16]  Covid Evaluation: Confirmed COVID Negative  Diagnosis: Neutropenic fever [facility]) [[id number]]  Admitting Physician: Harrie Foreman [[id number]]  Attending Physician: Harrie Foreman [[id number]]  Estimated length of stay: past midnight tomorrow  Certification:: I certify this patient will need inpatient services for at least 2 midnights  PT Class (Do Not Modify): Inpatient [101]  PT Acc Code (Do Not Modify): Private [1]       B Medical/Surgery History Past Medical History:  Diagnosis Date  . Breast mass, right 2015  . Cancer ([place])    AML  . Depression   . Dysmenorrhea   . GERD (gastroesophageal reflux disease)   . Headache    MIGRAINES  . Insomnia   . Insulin resistance   . Irregular menses   . Leukemia ([place])   . Menometrorrhagia   . Migraine with aura   . PMS (premenstrual syndrome)   . PONV (postoperative nausea and vomiting)    Past Surgical History:  Procedure Laterality Date  . ANKLE ARTHROSCOPY Right 05/07/2015   Procedure: ANKLE ARTHROSCOPY  / WITH TENOLYSIS;  Surgeon: [name], DPM;  Location: [facility] ORS;  Service: Podiatry;  Laterality: Right;  right ankle  arthroscopy with debridement, osteochondral repair, tenolysis of posterior tibial tendon  . BREAST FIBROADENOMA SURGERY Right 03/10/2013  . CARPAL TUNNEL RELEASE Left 09/11/2014   Procedure: CARPAL TUNNEL RELEASE;  Surgeon: Christophe Louis, MD;  Location: [facility] ORS;  Service: Orthopedics;  Laterality: Left;  . CHOLECYSTECTOMY       A IV Location/Drains/Wounds Patient Lines/Drains/Airways Status   Active Line/Drains/Airways    Name:   Placement date:   Placement time:   Site:   Days:   Peripheral IV 07/01/18 Left Antecubital   07/01/18    2347    Antecubital   1   Peripheral IV 07/02/18 Left Hand   07/02/18    0212    Hand   less than 1   PICC Single Lumen 96/78/93 PICC Right Basilic 40 cm 1 cm   81/01/75    1025    Basilic   852   Incision (Closed) 09/11/14 Hand Left   09/11/14    0847     1390   Incision (Closed) 05/07/15 Ankle   05/07/15    0903     1152   Incision (Closed) 04/09/18 Sacrum Medial   04/09/18    1500     84          Intake/Output Last 24 hours No intake or output data in the 24 hours ending 07/02/18 0248  Labs/Imaging Results for orders placed or performed during the hospital encounter of 07/01/18 (from the past 48 hour(s))  CBC with Differential  Status: Abnormal   Collection Time: 07/01/18 11:47 PM  Result Value Ref Range   WBC 0.4 (LL) 4.0 - 10.5 K/uL    Comment: REPEATED TO VERIFY CRITICAL VALUE NOTED.  VALUE IS CONSISTENT WITH PREVIOUSLY REPORTED AND CALLED VALUE.    RBC 2.49 (L) 3.87 - 5.11 MIL/uL   Hemoglobin 7.4 (L) 12.0 - 15.0 g/dL   HCT 21.4 (L) 36.0 - 46.0 %   MCV 85.9 80.0 - 100.0 fL   MCH 29.7 26.0 - 34.0 pg   MCHC 34.6 30.0 - 36.0 g/dL   RDW 12.3 11.5 - 15.5 %   Platelets 59 (L) 150 - 400 K/uL    Comment: PLATELET COUNT CONFIRMED BY SMEAR Immature Platelet Fraction may be clinically indicated, consider ordering this additional test [id number]    nRBC 0.0 0.0 - 0.2 %   Neutrophils Relative % 6 %   Neutro Abs 0.0 (L) 1.7 - 7.7 K/uL    Lymphocytes Relative 48 %   Lymphs Abs 0.2 (L) 0.7 - 4.0 K/uL   Monocytes Relative 46 %   Monocytes Absolute 0.2 0.1 - 1.0 K/uL   Eosinophils Relative 0 %   Eosinophils Absolute 0.0 0.0 - 0.5 K/uL   Basophils Relative 0 %   Basophils Absolute 0.0 0.0 - 0.1 K/uL   WBC Morphology TO FEW TO COUNT, SLIDE AVAILABLE FOR REVIEW    RBC Morphology MORPHOLOGY UNREMARKABLE    Immature Granulocytes 0 %   Abs Immature Granulocytes 0.00 0.00 - 0.07 K/uL    Comment: Performed at [facility], [address] [place], [place] [address]  Comprehensive metabolic panel     Status: Abnormal   Collection Time: 07/01/18 11:47 PM  Result Value Ref Range   Sodium 132 (L) 135 - 145 mmol/L   Potassium 3.1 (L) 3.5 - 5.1 mmol/L   Chloride 95 (L) 98 - 111 mmol/L   CO2 26 22 - 32 mmol/L   Glucose, Bld 143 (H) 70 - 99 mg/dL   BUN 14 6 - 20 mg/dL   Creatinine, Ser 0.95 0.44 - 1.00 mg/dL   Calcium 8.3 (L) 8.9 - 10.3 mg/dL   Total Protein 7.1 6.5 - 8.1 g/dL   Albumin 2.9 (L) 3.5 - 5.0 g/dL   AST 11 (L) 15 - 41 U/L   ALT 13 0 - 44 U/L   Alkaline Phosphatase 100 38 - 126 U/L   Total Bilirubin 1.1 0.3 - 1.2 mg/dL   GFR calc non Af Amer >60 >60 mL/min   GFR calc Af Amer >60 >60 mL/min   Anion gap 11 5 - 15    Comment: Performed at [facility], [address] [place], [place] [address]  Lactic acid, plasma     Status: None   Collection Time: 07/01/18 11:47 PM  Result Value Ref Range   Lactic Acid, Venous 1.0 0.5 - 1.9 mmol/L    Comment: Performed at [facility], [address] [place], [place] [address]  SARS Coronavirus 2 (CEPHEID- Performed in [place] hospital lab), Hosp Order     Status: None   Collection Time: 07/02/18 12:24 AM   Specimen: Nasopharyngeal Swab  Result Value Ref Range   SARS Coronavirus 2 NEGATIVE NEGATIVE    Comment: (NOTE) If result is NEGATIVE SARS-CoV-2 target nucleic acids are NOT DETECTED. The SARS-CoV-2 RNA is generally detectable in upper and  lower  respiratory specimens during the acute phase of infection. The lowest  concentration of SARS-CoV-2 viral copies this assay  can detect is 250  copies / mL. A negative result does not preclude SARS-CoV-2 infection  and should not be used as the sole basis for treatment or other  patient management decisions.  A negative result may occur with  improper specimen collection / handling, submission of specimen other  than nasopharyngeal swab, presence of viral mutation(s) within the  areas targeted by this assay, and inadequate number of viral copies  (<250 copies / mL). A negative result must be combined with clinical  observations, patient history, and epidemiological information. If result is POSITIVE SARS-CoV-2 target nucleic acids are DETECTED. The SARS-CoV-2 RNA is generally detectable in upper and lower  respiratory specimens dur ing the acute phase of infection.  Positive  results are indicative of active infection with SARS-CoV-2.  Clinical  correlation with patient history and other diagnostic information is  necessary to determine patient infection status.  Positive results do  not rule out bacterial infection or co-infection with other viruses. If result is PRESUMPTIVE POSTIVE SARS-CoV-2 nucleic acids MAY BE PRESENT.   A presumptive positive result was obtained on the submitted specimen  and confirmed on repeat testing.  While 2019 novel coronavirus  (SARS-CoV-2) nucleic acids may be present in the submitted sample  additional confirmatory testing may be necessary for epidemiological  and / or clinical management purposes  to differentiate between  SARS-CoV-2 and other Sarbecovirus currently known to infect humans.  If clinically indicated additional testing with an alternate test  methodology [phone]) is advised. The SARS-CoV-2 RNA is generally  detectable in upper and lower respiratory sp ecimens during the acute  phase of infection. The expected result is  Negative. Fact Sheet for Patients:  StrictlyIdeas.no Fact Sheet for Healthcare Providers: BankingDealers.co.za This test is not yet approved or cleared by the [place] FDA and has been authorized for detection and/or diagnosis of SARS-CoV-2 by FDA under an Emergency Use Authorization (EUA).  This EUA will remain in effect (meaning this test can be used) for the duration of the COVID-19 declaration under Section 564(b)(1) of the Act, 21 U.S.C. section 360bbb-3(b)(1), unless the authorization is terminated or revoked sooner. Performed at [facility], [address] [place], [place] [address]   Blood culture (routine x 2)     Status: None (Preliminary result)   Collection Time: 07/02/18 12:24 AM   Specimen: BLOOD  Result Value Ref Range   Specimen Description BLOOD BLOOD LEFT HAND    Special Requests      BOTTLES DRAWN AEROBIC AND ANAEROBIC Blood Culture adequate volume   Culture      NO GROWTH < 12 HOURS Performed at [facility], [address], [place], [place] [address]    Report Status PENDING   Blood culture (routine x 2)     Status: None (Preliminary result)   Collection Time: 07/02/18 12:24 AM   Specimen: BLOOD  Result Value Ref Range   Specimen Description BLOOD LEFT ANTECUBITAL    Special Requests      BOTTLES DRAWN AEROBIC AND ANAEROBIC Blood Culture results may not be optimal due to an excessive volume of blood received in culture bottles   Culture      NO GROWTH < 12 HOURS Performed at [facility], [address], [place], [place] [address]    Report Status PENDING    Dg Chest Portable 1 View  Result Date: 07/02/2018 CLINICAL DATA:  Shortness of breath EXAM: PORTABLE CHEST 1 VIEW COMPARISON:  04/10/2018 FINDINGS: Right PICC line remains  in place with the tip in the SVC. Heart and mediastinal contours are within normal limits. No focal opacities or effusions. No acute bony  abnormality. IMPRESSION: No active disease. Electronically Signed   By: Rolm Baptise M.D.   On: 07/02/2018 01:32    Pending Labs Unresulted Labs (From admission, onward)    Start     Ordered   07/02/18 0049  Urinalysis, Routine w reflex microscopic  ONCE - STAT,   STAT     07/02/18 0048   Signed and Held  TSH  Add-on,   R     Signed and Held   Signed and Held  Hemoglobin A1c  Add-on,   R     Signed and Held          Vitals/Pain Today's Vitals   07/02/18 0153 07/02/18 0200 07/02/18 0204 07/02/18 0240  BP:  (!) 93/53 95/61 115/69  Pulse:  99 (!) 101 90  Resp:  17 14 16   Temp:      TempSrc:      SpO2:  94% 96% 97%  Weight:      Height:      PainSc: Asleep       Isolation Precautions Droplet and Contact precautions  Medications Medications  vancomycin (VANCOCIN) 1,750 mg in sodium chloride 0.9 % 500 mL IVPB (1,750 mg Intravenous New Bag/Given 07/02/18 0226)  acetaminophen (TYLENOL) tablet 650 mg (650 mg Oral Given 07/02/18 0039)  ceFEPIme (MAXIPIME) 2 g in sodium chloride 0.9 % 100 mL IVPB (0 g Intravenous Stopped 07/02/18 0134)  metroNIDAZOLE (FLAGYL) IVPB 500 mg (0 mg Intravenous Stopped 07/02/18 0240)  0.9 %  sodium chloride infusion ( Intravenous New Bag/Given 07/02/18 0224)    Mobility walks with person assist Low fall risk   Focused Assessments Pulmonary Assessment Handoff:  Lung sounds:   O2 Device: Room Air        R Recommendations: See Admitting Provider Note  Report given to:   Additional Notes:

## 2018-07-02 NOTE — ED Notes (Signed)
Patient informed husband and son that she would be staying overnight for prophylactic antibiotics and lab work

## 2018-07-02 NOTE — ED Notes (Signed)
Patient transported to 1C, VSS, patient alert and oriented

## 2018-07-02 NOTE — Progress Notes (Addendum)
Rhonda Kane at [place] NAME: Rhonda Kane    MR#:  [id number]  DATE OF BIRTH:  31-Jan-1970  SUBJECTIVE:  CHIEF COMPLAINT: Patient is electively feeling better but still febrile She is not sure why she is on both metoprolol and Coreg  REVIEW OF SYSTEMS:  CONSTITUTIONAL: Reports fever, fatigue or weakness.  EYES: No blurred or double vision.  EARS, NOSE, AND THROAT: No tinnitus or ear pain.  RESPIRATORY: No cough, shortness of breath, wheezing or hemoptysis.  CARDIOVASCULAR: No chest pain, orthopnea, edema.  GASTROINTESTINAL: No nausea, vomiting, diarrhea or abdominal pain.  GENITOURINARY: No dysuria, hematuria.  ENDOCRINE: No polyuria, nocturia,  HEMATOLOGY: No anemia, easy bruising or bleeding SKIN: No rash or lesion. MUSCULOSKELETAL: No joint pain or arthritis.   NEUROLOGIC: No tingling, numbness, weakness.  PSYCHIATRY: No anxiety or depression.   DRUG ALLERGIES:  No Known Allergies  VITALS:  Blood pressure (!) 151/72, pulse 99, temperature (!) 101 F (38.3 C), temperature source Oral, resp. rate 18, height 5\' 4"  (1.626 m), weight 96.7 kg, SpO2 94 %.  PHYSICAL EXAMINATION:  GENERAL:  48 y.o.-year-old patient lying in the bed with no acute distress.  EYES: Pupils equal, round, reactive to light and accommodation. No scleral icterus. Extraocular muscles intact.  HEENT: Head atraumatic, normocephalic. Oropharynx and nasopharynx clear.  NECK:  Supple, no jugular venous distention. No thyroid enlargement, no tenderness.  LUNGS: Normal breath sounds bilaterally, no wheezing, rales,rhonchi or crepitation. No use of accessory muscles of respiration.  CARDIOVASCULAR: S1, S2 normal. No murmurs, rubs, or gallops.  ABDOMEN: Soft, nontender, nondistended. Bowel sounds present.  EXTREMITIES: Right upper extremity with PICC line no pedal edema, cyanosis, or clubbing.  NEUROLOGIC: Cranial nerves II through XII are intact. Muscle strength  global weakness in all extremities. Sensation intact. Gait not checked.  PSYCHIATRIC: The patient is alert and oriented x 3.  SKIN: No obvious rash, lesion, or ulcer.    LABORATORY PANEL:   CBC Recent Labs  Lab 07/01/18 2347  WBC 0.4*  HGB 7.4*  HCT 21.4*  PLT 59*   ------------------------------------------------------------------------------------------------------------------  Chemistries  Recent Labs  Lab 07/01/18 2347  NA 132*  K 3.1*  CL 95*  CO2 26  GLUCOSE 143*  BUN 14  CREATININE 0.95  CALCIUM 8.3*  AST 11*  ALT 13  ALKPHOS 100  BILITOT 1.1   ------------------------------------------------------------------------------------------------------------------  Cardiac Enzymes No results for input(s): TROPONINI in the last 168 hours. ------------------------------------------------------------------------------------------------------------------  RADIOLOGY:  Dg Chest Portable 1 View  Result Date: 07/02/2018 CLINICAL DATA:  Shortness of breath EXAM: PORTABLE CHEST 1 VIEW COMPARISON:  04/10/2018 FINDINGS: Right PICC line remains in place with the tip in the SVC. Heart and mediastinal contours are within normal limits. No focal opacities or effusions. No acute bony abnormality. IMPRESSION: No active disease. Electronically Signed   By: Rolm Baptise M.D.   On: 07/02/2018 01:32    EKG:   Orders placed or performed during the hospital encounter of 07/01/18  . ED EKG 12-Lead  . ED EKG 12-Lead    ASSESSMENT AND PLAN:    This is a 48 year old female admitted for neutropenic fever.  1.  Neutropenic fever: ANC is 400; now afebrile.   Continue IV antibiotics cefepime and vancomycin and monitor neutrophil count.  Oncology consult placed and secure chart message sent to Dr. Grayland Ormond.  Patient was seen by Dr. Tasia Catchings before 2.  Sepsis: The patient meets criteria via fever, tachycardia, intermittent tachypnea and leukopenia at the  time of admission.   She is  hemodynamically stable.   Continue to hydrate with intravenous fluid.   Follow blood cultures as well as urine cultures for growth and sensitivities.  Source is unclear.  Lungs are clear and no indication of urinary tract infection.  The patient has mild abdominal pain at the site of chemotherapy injection but no severe abdominal pain to suggest typhlitis.  Continue to monitor.  Also continue posaconazole for fungal prophylaxis 3.  AML: Pancytopenia.  Continue immunotherapy.  Patient follows with [facility] oncology Dr. Dellis Filbert 4.  Hypokalemia: Replete potassium recheck in a.m. 5.  Hypertension: Continue carvedilol, losartan and discontinue metoprolol XL.  Discussed with patient's primary cardiologist nurse, patient was never started on metoprolol per cardiology 6.  Viral prophylaxis: Continue valacyclovir 7.  DVT prophylaxis: SCDs (no pharmacologic prophylaxis due to thrombocytopenia) 8.  GI prophylaxis: Pantoprazole    All the records are reviewed and case discussed with Care Management/Social Workerr. Management plans discussed with the patient, plan of care discussed with the patient's husband over phone and they are in agreement.  CODE STATUS: fc   TOTAL TIME TAKING CARE OF THIS PATIENT: 36  minutes.   POSSIBLE D/C IN 1-2  DAYS, DEPENDING ON CLINICAL CONDITION.  Note: This dictation was prepared with Dragon dictation along with smaller phrase technology. Any transcriptional errors that result from this process are unintentional.   Nicholes Mango M.D on 07/02/2018 at 3:28 PM  Between 7am to 6pm - Pager - [phone] After 6pm go to [email or web address] - password [email or web address] [facility]  [place] [facility]  Office  [phone]  CC: Primary care physician; Rhonda Pink, MD

## 2018-07-02 NOTE — ED Notes (Signed)
Informed MD of slight hypotension, MD will order fluids

## 2018-07-02 NOTE — Progress Notes (Signed)
Family Meeting Note  Advance Directive:yes  Today a meeting took place with the Patient.    The following clinical team members were present during this meeting:MD  The following were discussed:Patient's diagnosis: Neutropenic fever, with history of AML on chemotherapy and immunotherapy, GERD is admitted to the hospital.  Treatment plan of care discussed in detail with the patient.  She verbalized understanding of the plan   patient's progosis: Unable to determine and Goals for treatment: Full Code Husband is the healthcare power of attorney  Additional follow-up to be provided: Hospitalist and oncology  Time spent during discussion:17 min  Nicholes Mango, MD

## 2018-07-02 NOTE — Consult Note (Signed)
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  She has admitted to the hospital with fevers greater than 103.  She currently is afebrile and feels improved from admission.  She has chronic weakness and fatigue.  She has no neurologic complaints.  She denies any chest pain, shortness of breath, cough, or hemoptysis.  She denies any nausea, vomiting, constipation, or diarrhea.  She has no urinary complaints.  Patient otherwise feels well and offers no further specific complaints today.  REVIEW OF SYSTEMS:   Review of Systems  Constitutional: Positive for fever and malaise/fatigue. Negative for weight loss.  Respiratory: Negative.  Negative for cough, hemoptysis and shortness of breath.   Cardiovascular: Negative.  Negative for chest pain and leg swelling.  Gastrointestinal: Negative.  Negative for abdominal pain.  Genitourinary: Negative.  Negative for dysuria, frequency and hematuria.  Musculoskeletal: Negative.  Negative for back pain.  Skin: Negative.  Negative for rash.  Neurological: Negative.  Negative for dizziness, focal weakness, weakness and headaches.  Psychiatric/Behavioral: Negative.  The patient is not nervous/anxious.     As per HPI. Otherwise, a complete review of systems is negative.  PAST MEDICAL HISTORY: Past Medical History:  Diagnosis Date  . Breast mass, right 2015  . Cancer ([place])    AML  . Depression   . Dysmenorrhea   . GERD (gastroesophageal reflux disease)   . Headache    MIGRAINES  . Insomnia   . Insulin resistance   . Irregular menses   . Leukemia ([place])   .  Menometrorrhagia   . Migraine with aura   . PMS (premenstrual syndrome)   . PONV (postoperative nausea and vomiting)     PAST SURGICAL HISTORY: Past Surgical History:  Procedure Laterality Date  . ANKLE ARTHROSCOPY Right 05/07/2015   Procedure: ANKLE ARTHROSCOPY  / WITH TENOLYSIS;  Surgeon: [name], DPM;  Location: [facility] ORS;  Service: Podiatry;  Laterality: Right;  right ankle arthroscopy with debridement, osteochondral repair, tenolysis of posterior tibial tendon  . BREAST FIBROADENOMA SURGERY Right 03/10/2013  . CARPAL TUNNEL RELEASE Left 09/11/2014   Procedure: CARPAL TUNNEL RELEASE;  Surgeon: Christophe Louis, MD;  Location: [facility] ORS;  Service: Orthopedics;  Laterality: Left;  . CHOLECYSTECTOMY      FAMILY HISTORY: Family History  Problem Relation Age of Onset  . COPD Mother   . COPD Father     ADVANCED DIRECTIVES (Y/N):  @ADVDIR @  HEALTH MAINTENANCE: Social History   Tobacco Use  . Smoking status: Never Smoker  . Smokeless tobacco: Never Used  Substance Use Topics  . Alcohol use: No  . Drug use: No     Colonoscopy:  PAP:  Bone density:  Lipid panel:  No Known Allergies  Current Facility-Administered Medications  Medication Dose Route Frequency Provider Last Rate Last Dose  . 0.9 % NaCl with KCl 40 mEq / L  infusion   Intravenous Continuous Harrie Foreman, MD 125 mL/hr at 07/02/18 0516 125 mL/hr at 07/02/18 0516  . acetaminophen (TYLENOL) tablet 650 mg  650 mg Oral Q6H PRN Harrie Foreman, MD       Or  . acetaminophen (TYLENOL) suppository 650 mg  650  mg Rectal Q6H PRN Harrie Foreman, MD      . allopurinol (ZYLOPRIM) tablet 300 mg  300 mg Oral Daily Harrie Foreman, MD   300 mg at 07/02/18 1026  . busPIRone (BUSPAR) tablet 5 mg  5 mg Oral BID Harrie Foreman, MD   5 mg at 07/02/18 0920  . carvedilol (COREG) tablet 6.25 mg  6.25 mg Oral BID WC Gouru, Aruna, MD   6.25 mg at 07/02/18 1809  . ceFEPIme (MAXIPIME) 2 g in sodium chloride 0.9 %  100 mL IVPB  2 g Intravenous Q8H Harrie Foreman, MD 200 mL/hr at 07/02/18 1808 2 g at 07/02/18 1808  . chlorhexidine (PERIDEX) 0.12 % solution 15 mL  15 mL Mouth/Throat BID Harrie Foreman, MD   15 mL at 07/02/18 0913  . cyclobenzaprine (FLEXERIL) tablet 5 mg  5 mg Oral Daily PRN Harrie Foreman, MD      . docusate sodium (COLACE) capsule 100 mg  100 mg Oral BID Harrie Foreman, MD   100 mg at 07/02/18 0919  . feeding supplement (ENSURE ENLIVE) (ENSURE ENLIVE) liquid 237 mL  237 mL Oral TID BM Gouru, Aruna, MD      . magic mouthwash  5 mL Oral QID PRN Harrie Foreman, MD       And  . lidocaine (XYLOCAINE) 2 % viscous mouth solution 5 mL  5 mL Mouth/Throat QID PRN Harrie Foreman, MD      . losartan (COZAAR) tablet 12.5 mg  12.5 mg Oral Daily Harrie Foreman, MD   12.5 mg at 07/02/18 0919  . metroNIDAZOLE (FLAGYL) IVPB 500 mg  500 mg Intravenous Q8H Harrie Foreman, MD 100 mL/hr at 07/02/18 0957 500 mg at 07/02/18 0957  . mupirocin ointment (BACTROBAN) 2 % 1 application  1 application Nasal TID Harrie Foreman, MD      . ondansetron [facility]) tablet 4 mg  4 mg Oral Q6H PRN Harrie Foreman, MD       Or  . ondansetron [facility]) injection 4 mg  4 mg Intravenous Q6H PRN Harrie Foreman, MD      . pantoprazole (PROTONIX) EC tablet 40 mg  40 mg Oral Daily Harrie Foreman, MD   40 mg at 07/02/18 0918  . posaconazole (NOXAFIL) delayed-release tablet 100 mg  100 mg Oral TID Harrie Foreman, MD   100 mg at 07/02/18 1808  . prochlorperazine (COMPAZINE) injection 10 mg  10 mg Intravenous Q6H PRN Harrie Foreman, MD      . traZODone (DESYREL) tablet 50 mg  50 mg Oral QHS PRN Harrie Foreman, MD      . valACYclovir [facility]pin) tablet 500 mg  500 mg Oral Daily Harrie Foreman, MD   500 mg at 07/02/18 4332  . [START ON 07/03/2018] vancomycin (VANCOCIN) 1,250 mg in sodium chloride 0.9 % 250 mL IVPB  1,250 mg Intravenous Q24H Gouru, Aruna, MD      . venetoclax TABS 100 mg   100 mg Oral Daily Harrie Foreman, MD   100 mg at 07/02/18 1810  . venlafaxine XR (EFFEXOR-XR) 24 hr capsule 300 mg  300 mg Oral Daily Harrie Foreman, MD   300 mg at 07/02/18 0920    OBJECTIVE: Vitals:   07/02/18 1153 07/02/18 1643  BP: (!) 151/72 138/66  Pulse: 99 100  Resp:    Temp: (!) 101 F (38.3 C) (!) 101.2 F (38.4 C)  SpO2:  94% 94%     Body mass index is 36.59 kg/m.    ECOG FS:1 - Symptomatic but completely ambulatory  General: Well-developed, well-nourished, no acute distress. Eyes: Pink conjunctiva, anicteric sclera. HEENT: Normocephalic, moist mucous membranes, clear oropharnyx. Lungs: Clear to auscultation bilaterally. Heart: Regular rate and rhythm. No rubs, murmurs, or gallops. Abdomen: Soft, nontender, nondistended. No organomegaly noted, normoactive bowel sounds. Musculoskeletal: No edema, cyanosis, or clubbing. Neuro: Alert, answering all questions appropriately. Cranial nerves grossly intact. Skin: No rashes or petechiae noted. Psych: Normal affect.  LAB RESULTS:  Lab Results  Component Value Date   NA 132 (L) 07/01/2018   K 3.1 (L) 07/01/2018   CL 95 (L) 07/01/2018   CO2 26 07/01/2018   GLUCOSE 143 (H) 07/01/2018   BUN 14 07/01/2018   CREATININE 0.95 07/01/2018   CALCIUM 8.3 (L) 07/01/2018   PROT 7.1 07/01/2018   ALBUMIN 2.9 (L) 07/01/2018   AST 11 (L) 07/01/2018   ALT 13 07/01/2018   ALKPHOS 100 07/01/2018   BILITOT 1.1 07/01/2018   GFRNONAA >60 07/01/2018   GFRAA >60 07/01/2018    Lab Results  Component Value Date   WBC 0.4 (LL) 07/01/2018   NEUTROABS 0.0 (L) 07/01/2018   HGB 7.4 (L) 07/01/2018   HCT 21.4 (L) 07/01/2018   MCV 85.9 07/01/2018   PLT 59 (L) 07/01/2018     STUDIES: Dg Chest Portable 1 View  Result Date: 07/02/2018 CLINICAL DATA:  Shortness of breath EXAM: PORTABLE CHEST 1 VIEW COMPARISON:  04/10/2018 FINDINGS: Right PICC line remains in place with the tip in the SVC. Heart and mediastinal contours are within  normal limits. No focal opacities or effusions. No acute bony abnormality. IMPRESSION: No active disease. Electronically Signed   By: Rolm Baptise M.D.   On: 07/02/2018 01:32    ASSESSMENT: AML, neutropenic fever  PLAN:    1.  AML: Patient actively receiving chemotherapy with decitabine 20 mg/m on days 1 through 7.  She received day 6 yesterday.  Patient also receives venetoclax 100 mg daily.  Patient is also followed closely at [facility] and possibly may enroll in a clinical trial in the near future. 2.  Neutropenic fever: Patient's most recent fever greater than 101.  No obvious source.  Blood cultures negative to date, urinalysis normal, COVID-19 negative.  Continue current antibiotics and supportive care. 3.  Pancytopenia: Secondary to AML as well as the treatment.  Patient does not require transfusion at this time.  All blood products need to be irradiated.  Appreciate consult, will follow.   Lloyd Huger, MD   07/02/2018 6:43 PM

## 2018-07-02 NOTE — Progress Notes (Signed)
Pharmacy Antibiotic Note  Rhonda Kane is a 48 y.o. female admitted on 07/01/2018 with sepsis.  Pharmacy has been consulted for vanc/cefepime dosing. Patient received vanc 1.75g IV load, cefepime 2g IV, flagyl 500 mg IV x 1 in ED.  Plan: Vancomycin 1250 mg IV Q 24 hrs. Goal AUC 400-550. Expected AUC: 466.6 SCr used: 0.95 Cssmin: 10.0  Will continue cefepime 2g IV q8h and flagyl 500 mg IV q8h.  Height: 5\' 4"  (162.6 cm) Weight: 209 lb 10.5 oz (95.1 kg) IBW/kg (Calculated) : 54.7  Temp (24hrs), Avg:98.8 F (37.1 C), Min:97.6 F (36.4 C), Max:100.8 F (38.2 C)  Recent Labs  Lab 07/01/18 0852 07/01/18 2347  WBC 0.5* 0.4*  CREATININE 1.17* 0.95  LATICACIDVEN  --  1.0    Estimated Creatinine Clearance: 81.1 mL/min (by C-G formula based on SCr of 0.95 mg/dL).    No Known Allergies  Thank you for allowing pharmacy to be a part of this patient's care.  Tobie Lords, PharmD, BCPS Clinical Pharmacist 07/02/2018 6:31 AM

## 2018-07-02 NOTE — TOC Initial Note (Signed)
Transition of Care [facility]) - Initial/Assessment Note    Patient Details  Name: Rhonda Kane MRN: [id number] Date of Birth: Jan 06, 1971  Transition of Care [facility]) CM/SW Contact:    Shelbie Hutching, RN Phone Number: 07/02/2018, 11:44 AM  Clinical Narrative:                 Patient admitted with neutropenic fever.  Patient is from from home with her husband.  Patient reports that she is independent in ADL's and drives.  Patient currently does not have insurance but she will be going on her husband's insurance starting July 1st.  Patient has long term disability and has been doing self pay for medical treatment.  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  Patient is current with PCP and gets prescriptions from [facility].  No discharge needs identified at this time.   Expected Discharge Plan: Home/Self Care Barriers to Discharge: Continued Medical Work up   Patient Goals and CMS Choice Patient states their goals for this hospitalization and ongoing recovery are:: Get better and be able to go home      Expected Discharge Plan and Services Expected Discharge Plan: Home/Self Care   Discharge Planning Services: CM Consult   Living arrangements for the past 2 months: Single Family Home                                      Prior Living Arrangements/Services Living arrangements for the past 2 months: Single Family Home Lives with:: Spouse Patient language and need for interpreter reviewed:: No Do you feel safe going back to the place where you live?: Yes      Need for Family Participation in Patient Care: Yes (Comment)(patient has cancer needs support) Care giver support system in place?: Yes (comment)(husband)   Criminal Activity/Legal Involvement Pertinent to Current Situation/Hospitalization: No - Comment as needed  Activities of Daily Living Home Assistive Devices/Equipment: None ADL Screening (condition at time of admission) Patient's cognitive  ability adequate to safely complete daily activities?: Yes Is the patient deaf or have difficulty hearing?: No Does the patient have difficulty seeing, even when wearing glasses/contacts?: No Does the patient have difficulty concentrating, remembering, or making decisions?: No Patient able to express need for assistance with ADLs?: Yes Does the patient have difficulty dressing or bathing?: No Independently performs ADLs?: Yes (appropriate for developmental age) Does the patient have difficulty walking or climbing stairs?: Yes Weakness of Legs: Both Weakness of Arms/Hands: None  Permission Sought/Granted                  Emotional Assessment Appearance:: Appears older than stated age Attitude/Demeanor/Rapport: Engaged Affect (typically observed): Accepting Orientation: : Oriented to Self, Oriented to Place, Oriented to  Time, Oriented to Situation Alcohol / Substance Use: Not Applicable Psych Involvement: No (comment)  Admission diagnosis:  Fever Patient Active Problem List   Diagnosis Date Noted  . Dehydration 07/01/2018  . Neutropenic fever ([place])   . Hypokalemia 03/21/2018  . Encounter for antineoplastic chemotherapy 03/18/2018  . 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 Anemia due to antineoplastic chemotherapy 02/14/2018  . Goals of care, counseling/discussion 02/09/2018  . Pancytopenia ([place]) 02/02/2018  . 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[place])   . Anemia   . Thrombocytopenia ([place])   . Neutropenia ([place])   . Symptomatic anemia 12/13/2017   PCP:  [place] Pink,  MD Pharmacy:   [id number] [address], [place] [address] Phone: [phone] Fax: [phone]     Social Determinants of Health (SDOH) Interventions    Readmission Risk Interventions No flowsheet data found.

## 2018-07-02 NOTE — Progress Notes (Signed)
Initial Nutrition Assessment  DOCUMENTATION CODES:   Obesity unspecified  INTERVENTION:  Recommend liberalizing diet to regular.  Provide Ensure Enlive po TID, each supplement provides 350 kcal and 20 grams of protein. Patient prefers chocolate.  Provide Magic cup TID with meals, each supplement provides 290 kcal and 9 grams of protein. Patient prefers orange.  Encouraged intake of calorie- and protein-dense foods to help meet calorie/protein needs.  NUTRITION DIAGNOSIS:   Increased nutrient needs related to catabolic illness(AML on chemotherapy) as evidenced by estimated needs.  GOAL:   Patient will meet greater than or equal to 90% of their needs  MONITOR:   PO intake, Supplement acceptance, Labs, Weight trends, I & O's  REASON FOR ASSESSMENT:   Malnutrition Screening Tool    ASSESSMENT:   48 year old female with PMHx of GERD, depression, AML on chemotherapy who is now admitted with neutropenic fever, sepsis.   Spoke with patient over the phone. She reports she has had a poor appetite while on chemotherapy. She reports her appetite normally improves some after she finishes the cycle of chemotherapy. She does not eat any full meals anymore. She reports mainly having bites of applesauce during the day. She does not regularly drink any ONS but is amenable to start drinking them.  Patient reports she has lost about 9 lbs recently. Per chart she was 99.8 kg on 05/27/2018. She is currently 96.7 kg (213.19 lbs). She has lost 3.1 kg (3.1% body weight) over the past month, which is not significant for time frame but still concerning.  Medications reviewed and include: allopurinol, Colace 100 mg BID, pantoprazole, cefepime, Flagyl, vancomycin.  Labs reviewed: Sodium 132, Potassium 3.1, Chloride 95.  NUTRITION - FOCUSED PHYSICAL EXAM:  Unable to complete at this time.  Diet Order:   Diet Order            Diet Heart Room service appropriate? Yes; Fluid consistency: Thin  Diet  effective now             EDUCATION NEEDS:   Education needs have been addressed  Skin:  Skin Assessment: Reviewed RN Assessment  Last BM:  07/02/2018 per chart  Height:   Ht Readings from Last 1 Encounters:  07/02/18 5\' 4"  (1.626 m)   Weight:   Wt Readings from Last 1 Encounters:  07/02/18 96.7 kg   Ideal Body Weight:  54.5 kg  BMI:  Body mass index is 36.59 kg/m.  Estimated Nutritional Needs:   Kcal:  9702-6378 (MSJ x 1.3-1.5)  Protein:  100-120 grams  Fluid:  2-2.4 L/day  Willey Blade, MS, RD, LDN Office: [phone] Pager: [phone] After Hours/Weekend Pager: [phone]

## 2018-07-03 ENCOUNTER — Ambulatory Visit: Payer: 59

## 2018-07-03 ENCOUNTER — Other Ambulatory Visit: Payer: 59

## 2018-07-03 DIAGNOSIS — D6959 Other secondary thrombocytopenia: Secondary | ICD-10-CM

## 2018-07-03 DIAGNOSIS — D630 Anemia in neoplastic disease: Secondary | ICD-10-CM

## 2018-07-03 DIAGNOSIS — K1230 Oral mucositis (ulcerative), unspecified: Secondary | ICD-10-CM

## 2018-07-03 DIAGNOSIS — D6481 Anemia due to antineoplastic chemotherapy: Secondary | ICD-10-CM

## 2018-07-03 DIAGNOSIS — Z95828 Presence of other vascular implants and grafts: Secondary | ICD-10-CM

## 2018-07-03 LAB — BASIC METABOLIC PANEL
Anion gap: 6 (ref 5–15)
BUN: 9 mg/dL (ref 6–20)
CO2: 25 mmol/L (ref 22–32)
Calcium: 7.9 mg/dL — ABNORMAL LOW (ref 8.9–10.3)
Chloride: 104 mmol/L (ref 98–111)
Creatinine, Ser: 0.83 mg/dL (ref 0.44–1.00)
GFR calc Af Amer: >60 mL/min (ref >60)
GFR calc non Af Amer: >60 mL/min (ref >60)
Glucose, Bld: 154 mg/dL — ABNORMAL HIGH (ref 70–99)
Potassium: 3.2 mmol/L — ABNORMAL LOW (ref 3.5–5.1)
Sodium: 135 mmol/L (ref 135–145)

## 2018-07-03 LAB — GASTROINTESTINAL PANEL BY PCR, STOOL (REPLACES STOOL CULTURE)

## 2018-07-03 LAB — RESPIRATORY PANEL BY PCR

## 2018-07-03 LAB — PREPARE RBC (CROSSMATCH)

## 2018-07-03 LAB — CBC: NRBC: 0 % (ref 0.0–0.2)

## 2018-07-03 MED ORDER — SODIUM CHLORIDE 0.9% IV SOLUTION
Freq: Once | INTRAVENOUS | Status: AC
  Administered 2018-07-03: 15:00:00 via INTRAVENOUS

## 2018-07-03 MED ORDER — ALBUTEROL SULFATE (2.5 MG/3ML) 0.083% IN NEBU: 2.5000 mg | INHALATION_SOLUTION | RESPIRATORY_TRACT | Status: DC | PRN

## 2018-07-03 MED ORDER — FUROSEMIDE 10 MG/ML IJ SOLN
20.0000 mg | Freq: Once | INTRAMUSCULAR | Status: AC
  Administered 2018-07-03: 20 mg via INTRAVENOUS
  Filled 2018-07-03: qty 2

## 2018-07-03 MED ORDER — TRAMADOL HCL 50 MG PO TABS
50.0000 mg | ORAL_TABLET | Freq: Four times a day (QID) | ORAL | Status: DC | PRN
  Administered 2018-07-03 – 2018-07-07 (×5): 50 mg via ORAL
  Filled 2018-07-03 (×5): qty 1

## 2018-07-03 MED ORDER — SODIUM CHLORIDE 0.9 % IV SOLN
INTRAVENOUS | Status: DC | PRN
  Administered 2018-07-03 (×2): 250 mL via INTRAVENOUS

## 2018-07-03 MED ORDER — SODIUM CHLORIDE 0.9% IV SOLUTION: Freq: Once | INTRAVENOUS | Status: DC

## 2018-07-03 MED ORDER — POTASSIUM CHLORIDE CRYS ER 20 MEQ PO TBCR
40.0000 meq | EXTENDED_RELEASE_TABLET | Freq: Once | ORAL | Status: AC
  Administered 2018-07-03: 13:00:00 40 meq via ORAL
  Filled 2018-07-03: qty 2

## 2018-07-03 MED ORDER — ACETAMINOPHEN 325 MG PO TABS
650.0000 mg | ORAL_TABLET | Freq: Once | ORAL | Status: AC
  Administered 2018-07-03: 15:00:00 650 mg via ORAL
  Filled 2018-07-03: qty 2

## 2018-07-03 MED ORDER — SODIUM CHLORIDE 0.9% FLUSH: 10.0000 mL | INTRAVENOUS | Status: DC | PRN

## 2018-07-03 MED ORDER — ERYTHROMYCIN 5 MG/GM OP OINT
TOPICAL_OINTMENT | Freq: Four times a day (QID) | OPHTHALMIC | Status: DC
  Administered 2018-07-03 – 2018-07-06 (×7): 1 via OPHTHALMIC
  Administered 2018-07-06: 17:00:00 via OPHTHALMIC
  Filled 2018-07-03: qty 1

## 2018-07-03 MED ORDER — SODIUM CHLORIDE 0.9% FLUSH
10.0000 mL | Freq: Two times a day (BID) | INTRAVENOUS | Status: DC
  Administered 2018-07-03 – 2018-07-06 (×5): 10 mL

## 2018-07-03 NOTE — Progress Notes (Signed)
[place] [place] NAME: Rhonda Kane    MR#:  [id number]  DATE OF BIRTH:  1971/01/16  SUBJECTIVE:  CHIEF COMPLAINT: Patient is  still febrile.  Left eye lid is swollen and bruised She is not sure why.  Hemoglobin at 6.0  REVIEW OF SYSTEMS:  CONSTITUTIONAL: Reports fever, fatigue or weakness.  EYES: No blurred or double vision.  EARS, NOSE, AND THROAT: No tinnitus or ear pain.  RESPIRATORY: No cough, shortness of breath, wheezing or hemoptysis.  CARDIOVASCULAR: No chest pain, orthopnea, edema.  GASTROINTESTINAL: No nausea, vomiting, diarrhea or abdominal pain.  GENITOURINARY: No dysuria, hematuria.  ENDOCRINE: No polyuria, nocturia,  HEMATOLOGY: No anemia, easy bruising or bleeding SKIN: No rash or lesion. MUSCULOSKELETAL: No joint pain or arthritis.   NEUROLOGIC: No tingling, numbness, weakness.  PSYCHIATRY: No anxiety or depression.   DRUG ALLERGIES:  No Known Allergies  VITALS:  Blood pressure 135/72, pulse 99, temperature 99.3 F (37.4 C), temperature source Oral, resp. rate 18, height 5\' 4"  (1.626 m), weight 96.1 kg, SpO2 97 %.  PHYSICAL EXAMINATION:  GENERAL:  48 y.o.-year-old patient lying in the bed with no acute distress.  EYES: Pupils equal, round, reactive to light and accommodation. No scleral icterus. Extraocular muscles intact.  HEENT: Head atraumatic, normocephalic. Oropharynx and nasopharynx clear.  NECK:  Supple, no jugular venous distention. No thyroid enlargement, no tenderness.  LUNGS: Normal breath sounds bilaterally, no wheezing, rales,rhonchi or crepitation. No use of accessory muscles of respiration.  CARDIOVASCULAR: S1, S2 normal. No murmurs, rubs, or gallops.  ABDOMEN: Soft, nontender, nondistended. Bowel sounds present.  EXTREMITIES: Right upper extremity with PICC line no pedal edema, cyanosis, or clubbing.  NEUROLOGIC: Cranial nerves II through XII are intact. Muscle strength global weakness  in all extremities. Sensation intact. Gait not checked.  PSYCHIATRIC: The patient is alert and oriented x 3.  SKIN: No obvious rash, lesion, or ulcer.    LABORATORY PANEL:   CBC Recent Labs  Lab 07/03/18 0946  WBC 0.2*  HGB 6.0*  HCT 17.1*  PLT 33*   ------------------------------------------------------------------------------------------------------------------  Chemistries  Recent Labs  Lab 07/01/18 2347 07/03/18 0506  NA 132* 135  K 3.1* 3.2*  CL 95* 104  CO2 26 25  GLUCOSE 143* 154*  BUN 14 9  CREATININE 0.95 0.83  CALCIUM 8.3* 7.9*  AST 11*  --   ALT 13  --   ALKPHOS 100  --   BILITOT 1.1  --    ------------------------------------------------------------------------------------------------------------------  Cardiac Enzymes No results for input(s): TROPONINI in the last 168 hours. ------------------------------------------------------------------------------------------------------------------  RADIOLOGY:  Dg Chest Portable 1 View  Result Date: 07/02/2018 CLINICAL DATA:  Shortness of breath EXAM: PORTABLE CHEST 1 VIEW COMPARISON:  04/10/2018 FINDINGS: Right PICC line remains in place with the tip in the SVC. Heart and mediastinal contours are within normal limits. No focal opacities or effusions. No acute bony abnormality. IMPRESSION: No active disease. Electronically Signed   By: Rolm Baptise M.D.   On: 07/02/2018 01:32    EKG:   Orders placed or performed during the hospital encounter of 07/01/18  . ED EKG 12-Lead  . ED EKG 12-Lead  . EKG 12-Lead  . EKG 12-Lead    ASSESSMENT AND PLAN:    This is a 48 year old female admitted for neutropenic fever.  1.  Neutropenic fever:  febrile.   Continue IV antibiotics cefepime, Flagyl and vancomycin and monitor neutrophil count.  Dr. Grayland Ormond and Dr. Tasia Catchings are following  the patient 2.  Sepsis: The patient meets criteria via fever, tachycardia, intermittent tachypnea and leukopenia at the time of admission.    She is hemodynamically stable.   Continue to hydrate with intravenous fluid.   Follow blood cultures as well as urine cultures for growth and sensitivities.  Source is unclear.  Lungs are clear and no indication of urinary tract infection.  The patient has mild abdominal pain at the site of chemotherapy injection but no severe abdominal pain to suggest typhlitis.  Continue to monitor.  Also continue posaconazole for fungal prophylaxis 3.  AML: Pancytopenia.  Continue immunotherapy.  Patient follows with [facility] oncology Dr. Dellis Filbert 4.  Hypokalemia: Replete potassium recheck in a.m. 5.  Hypertension: Continue carvedilol, losartan and discontinue metoprolol XL.  Discussed with patient's primary cardiologist nurse, patient was never started on metoprolol per cardiology 6.  Viral prophylaxis: Continue valacyclovir 7.  Symptomatic anemia with hemoglobin 6-transfuse irradiated PRBC premedicate with Tylenol and give Lasix after blood transfusion   DVT prophylaxis: SCDs (no pharmacologic prophylaxis due to thrombocytopenia) 8.  GI prophylaxis: Pantoprazole    All the records are reviewed and case discussed with Care Management/Social Workerr. Management plans discussed with the patient, plan of care discussed with the patient's husband over phone and they are in agreement.  CODE STATUS: fc   TOTAL TIME TAKING CARE OF THIS PATIENT: 36  minutes.   POSSIBLE D/C IN 1-2  DAYS, DEPENDING ON CLINICAL CONDITION.  Note: This dictation was prepared with Dragon dictation along with smaller phrase technology. Any transcriptional errors that result from this process are unintentional.   Nicholes Mango M.D on 07/03/2018 at 2:09 PM  Between 7am to 6pm - Pager - [phone] After 6pm go to [email or web address] - password [email or web address] [facility]  [place] [facility]  Office  [phone]  CC: Primary care physician; Maryland Pink, MD

## 2018-07-03 NOTE — Progress Notes (Signed)
Notified Dr Margaretmary Eddy that per CCMD tech pt had 3x pauses at 1020.  This Probation officer was with pt at that time. Pt was vomiting. Per MD to order an EKG.  Also notified MD of CBC results. MD to place orders.
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
## 2018-07-03 NOTE — Plan of Care (Signed)

## 2018-07-04 ENCOUNTER — Inpatient Hospital Stay: Payer: Self-pay

## 2018-07-04 DIAGNOSIS — D709 Neutropenia, unspecified: Secondary | ICD-10-CM

## 2018-07-04 DIAGNOSIS — I5020 Unspecified systolic (congestive) heart failure: Secondary | ICD-10-CM

## 2018-07-04 DIAGNOSIS — Z79899 Other long term (current) drug therapy: Secondary | ICD-10-CM

## 2018-07-04 DIAGNOSIS — D649 Anemia, unspecified: Secondary | ICD-10-CM

## 2018-07-04 DIAGNOSIS — R238 Other skin changes: Secondary | ICD-10-CM

## 2018-07-04 DIAGNOSIS — Z9889 Other specified postprocedural states: Secondary | ICD-10-CM

## 2018-07-04 DIAGNOSIS — D469 Myelodysplastic syndrome, unspecified: Secondary | ICD-10-CM

## 2018-07-04 LAB — CBC WITH DIFFERENTIAL/PLATELET
Abs Immature Granulocytes: 0 10*3/uL (ref 0.00–0.07)
Basophils Absolute: 0 10*3/uL (ref 0.0–0.1)
Basophils Relative: 0 %
Eosinophils Absolute: 0 10*3/uL (ref 0.0–0.5)
Eosinophils Relative: 0 %
HCT: 19.5 % — ABNORMAL LOW (ref 36.0–46.0)
Hemoglobin: 6.9 g/dL — ABNORMAL LOW (ref 12.0–15.0)
Immature Granulocytes: 0 %
Lymphocytes Relative: 52 %
Lymphs Abs: 0.1 10*3/uL — ABNORMAL LOW (ref 0.7–4.0)
MCH: 29.9 pg (ref 26.0–34.0)
MCHC: 35.4 g/dL (ref 30.0–36.0)
MCV: 84.4 fL (ref 80.0–100.0)
Monocytes Absolute: 0.1 10*3/uL (ref 0.1–1.0)
Monocytes Relative: 39 %
Neutro Abs: 0 10*3/uL — ABNORMAL LOW (ref 1.7–7.7)
Neutrophils Relative %: 9 %
Platelets: 31 10*3/uL — ABNORMAL LOW (ref 150–400)
RBC: 2.31 MIL/uL — ABNORMAL LOW (ref 3.87–5.11)
RDW: 12.7 % (ref 11.5–15.5)
Smear Review: DECREASED
WBC: 0.2 10*3/uL — CL (ref 4.0–10.5)
nRBC: 0 % (ref 0.0–0.2)

## 2018-07-04 LAB — BASIC METABOLIC PANEL
Anion gap: 8 (ref 5–15)
BUN: 9 mg/dL (ref 6–20)
CO2: 27 mmol/L (ref 22–32)
Calcium: 8 mg/dL — ABNORMAL LOW (ref 8.9–10.3)
Chloride: 99 mmol/L (ref 98–111)
Creatinine, Ser: 0.84 mg/dL (ref 0.44–1.00)
GFR calc Af Amer: >60 mL/min (ref >60)
GFR calc non Af Amer: >60 mL/min (ref >60)
Glucose, Bld: 132 mg/dL — ABNORMAL HIGH (ref 70–99)
Potassium: 2.9 mmol/L — ABNORMAL LOW (ref 3.5–5.1)
Sodium: 134 mmol/L — ABNORMAL LOW (ref 135–145)

## 2018-07-04 LAB — HEMOGLOBIN AND HEMATOCRIT, BLOOD
HCT: 20.9 % — ABNORMAL LOW (ref 36.0–46.0)
HCT: 22.2 % — ABNORMAL LOW (ref 36.0–46.0)
Hemoglobin: 7.3 g/dL — ABNORMAL LOW (ref 12.0–15.0)
Hemoglobin: 7.8 g/dL — ABNORMAL LOW (ref 12.0–15.0)

## 2018-07-04 LAB — POTASSIUM: Potassium: 3.8 mmol/L (ref 3.5–5.1)

## 2018-07-04 LAB — PREPARE RBC (CROSSMATCH)

## 2018-07-04 LAB — MAGNESIUM: Magnesium: 1.5 mg/dL — ABNORMAL LOW (ref 1.7–2.4)

## 2018-07-04 LAB — MRSA PCR SCREENING: MRSA by PCR: NEGATIVE

## 2018-07-04 MED ORDER — SODIUM CHLORIDE 0.9% IV SOLUTION
Freq: Once | INTRAVENOUS | Status: AC
  Administered 2018-07-04: 15:00:00 via INTRAVENOUS

## 2018-07-04 MED ORDER — SIMETHICONE 80 MG PO CHEW
80.0000 mg | CHEWABLE_TABLET | Freq: Four times a day (QID) | ORAL | Status: DC | PRN
  Administered 2018-07-04 – 2018-07-05 (×3): 80 mg via ORAL
  Filled 2018-07-04 (×4): qty 1

## 2018-07-04 MED ORDER — MAGNESIUM SULFATE 2 GM/50ML IV SOLN
2.0000 g | Freq: Once | INTRAVENOUS | Status: AC
  Administered 2018-07-04: 12:00:00 2 g via INTRAVENOUS
  Filled 2018-07-04: qty 50

## 2018-07-04 MED ORDER — VANCOMYCIN HCL IN DEXTROSE 1-5 GM/200ML-% IV SOLN
1000.0000 mg | Freq: Two times a day (BID) | INTRAVENOUS | Status: DC
  Administered 2018-07-04 – 2018-07-07 (×6): 1000 mg via INTRAVENOUS
  Filled 2018-07-04 (×8): qty 200

## 2018-07-04 MED ORDER — POTASSIUM CHLORIDE 10 MEQ/100ML IV SOLN
10.0000 meq | INTRAVENOUS | Status: AC
  Administered 2018-07-04 (×2): 10 meq via INTRAVENOUS
  Filled 2018-07-04 (×2): qty 100

## 2018-07-04 MED ORDER — IBUPROFEN 400 MG PO TABS
400.0000 mg | ORAL_TABLET | ORAL | Status: DC | PRN
  Administered 2018-07-04 (×2): 400 mg via ORAL
  Filled 2018-07-04 (×2): qty 1

## 2018-07-04 MED ORDER — POTASSIUM CHLORIDE CRYS ER 20 MEQ PO TBCR
40.0000 meq | EXTENDED_RELEASE_TABLET | Freq: Once | ORAL | Status: AC
  Administered 2018-07-04: 12:00:00 40 meq via ORAL
  Filled 2018-07-04: qty 2

## 2018-07-04 NOTE — Progress Notes (Signed)
[facility] [place] NAME: Rhonda Kane    MR#:  [id number]  DATE OF BIRTH:  [birth year]  SUBJECTIVE:  CHIEF COMPLAINT: Patient is  still febrile.  Left eye lid is swollen and bruised   Hemoglobin at 6.9  REVIEW OF SYSTEMS:  CONSTITUTIONAL: Reports fever, fatigue or weakness.  EYES: No blurred or double vision.  EARS, NOSE, AND THROAT: No tinnitus or ear pain.  RESPIRATORY: No cough, shortness of breath, wheezing or hemoptysis.  CARDIOVASCULAR: No chest pain, orthopnea, edema.  GASTROINTESTINAL: No nausea, vomiting, diarrhea or abdominal pain.  GENITOURINARY: No dysuria, hematuria.  ENDOCRINE: No polyuria, nocturia,  HEMATOLOGY: No anemia, easy bruising or bleeding SKIN: No rash or lesion. MUSCULOSKELETAL: No joint pain or arthritis.   NEUROLOGIC: No tingling, numbness, weakness.  PSYCHIATRY: No anxiety or depression.   DRUG ALLERGIES:  No Known Allergies  VITALS:  Blood pressure 138/73, pulse 99, temperature 99.8 F (37.7 C), temperature source Oral, resp. rate 18, height 5\' 4"  (1.626 m), weight 96.1 kg, SpO2 100 %.  PHYSICAL EXAMINATION:  GENERAL:  48 y.o.-year-old patient lying in the bed with no acute distress.  EYES: Pupils equal, round, reactive to light and accommodation. No scleral icterus. Extraocular muscles intact.  HEENT: Head atraumatic, normocephalic. Oropharynx and nasopharynx clear.  NECK:  Supple, no jugular venous distention. No thyroid enlargement, no tenderness.  LUNGS: Normal breath sounds bilaterally, no wheezing, rales,rhonchi or crepitation. No use of accessory muscles of respiration.  CARDIOVASCULAR: S1, S2 normal. No murmurs, rubs, or gallops.  ABDOMEN: Soft, nontender, nondistended. Bowel sounds present.  EXTREMITIES: Right upper extremity with PICC line no pedal edema, cyanosis, or clubbing.  NEUROLOGIC: Cranial nerves II through XII are intact. Muscle strength global weakness in all extremities.  Sensation intact. Gait not checked.  PSYCHIATRIC: The patient is alert and oriented x 3.  SKIN: No obvious rash, lesion, or ulcer.    LABORATORY PANEL:   CBC Recent Labs  Lab 07/04/18 0553  WBC 0.2*  HGB 6.9*  HCT 19.5*  PLT 31*   ------------------------------------------------------------------------------------------------------------------  Chemistries  Recent Labs  Lab 07/01/18 2347  07/04/18 0553  NA 132*   < > 134*  K 3.1*   < > 2.9*  CL 95*   < > 99  CO2 26   < > 27  GLUCOSE 143*   < > 132*  BUN 14   < > 9  CREATININE 0.95   < > 0.84  CALCIUM 8.3*   < > 8.0*  MG  --   --  1.5*  AST 11*  --   --   ALT 13  --   --   ALKPHOS 100  --   --   BILITOT 1.1  --   --    < > = values in this interval not displayed.   ------------------------------------------------------------------------------------------------------------------  Cardiac Enzymes No results for input(s): TROPONINI in the last 168 hours. ------------------------------------------------------------------------------------------------------------------  RADIOLOGY:  No results found.  EKG:   Orders placed or performed during the hospital encounter of 07/01/18  . ED EKG 12-Lead  . ED EKG 12-Lead  . EKG 12-Lead  . EKG 12-Lead    ASSESSMENT AND PLAN:    This is a 48 year old female admitted for neutropenic fever.  1.  Neutropenic fever:  febrile.    Unclear etiology Continue IV antibiotics cefepime, Flagyl and vancomycin and monitor neutrophil count.  Dr. Janese Banks Dr. Tasia Catchings are following the patient.  Discussed with Dr. Tasia Catchings  yesterday 2.  Sepsis: The patient meets criteria via fever, tachycardia, intermittent tachypnea and leukopenia at the time of admission.   She is hemodynamically stable.   Continue to hydrate with intravenous fluid. Blood cultures are negative.  Lungs are clear and no indication of urinary tract infection.  The patient has mild abdominal pain at the site of chemotherapy injection but  no severe abdominal pain to suggest typhlitis.  GI panel negative Continue to monitor.  Also continue posaconazole for fungal prophylaxis ID consulted and notified Dr. Levester Fresh on 07/03/2018 3.  AML: Pancytopenia.  Continue immunotherapy.  Patient follows with [facility] oncology Dr. Dellis Filbert 4.  Hypokalemia: Replete potassium recheck in a.m. 5.  Hypertension: Continue carvedilol, losartan and discontinue metoprolol XL.  Discussed with patient's primary cardiologist nurse, patient was never started on metoprolol per cardiology 6.  Viral prophylaxis: Continue valacyclovir 7.  Symptomatic anemia with hemoglobin 6.9 after 1 unit of blood transfusion yesterday will -transfuse irradiated PRBC today.  Goal is to keep her hemoglobin greater than 7.5 and platelets greater than 20,000 8.  Left eyelid bruise-could be from local trauma.  We will continue to close monitoring patient refused ophthalmology consult at this point of time.  DVT prophylaxis: SCDs (no pharmacologic prophylaxis due to thrombocytopenia)   GI prophylaxis: Pantoprazole    All the records are reviewed and case discussed with Care Management/Social Workerr. Management plans discussed with the patient, husband Shanon Brow over phone.  They are in agreement CODE STATUS: fc   TOTAL TIME TAKING CARE OF THIS PATIENT: 36  minutes.   POSSIBLE D/C IN 1-2  DAYS, DEPENDING ON CLINICAL CONDITION.  Note: This dictation was prepared with Dragon dictation along with smaller phrase technology. Any transcriptional errors that result from this process are unintentional.   Nicholes Mango M.D on 07/04/2018 at 3:13 PM  Between 7am to 6pm - Pager - [phone] After 6pm go to [email or web address] - password [email or web address] [facility]  [facility]  Office  [phone]  CC: Primary care physician; Maryland Pink, MD

## 2018-07-04 NOTE — Progress Notes (Signed)
Pharmacy Antibiotic Note  Rhonda Kane is a 48 y.o. female admitted on 07/01/2018 with sepsis.  Pharmacy has been consulted for vanc/cefepime dosing. Patient received vanc 1.75g IV load, cefepime 2g IV, flagyl 500 mg IV x 1 in ED.  Today, 07/04/2018 Day #3 antibiotics (vanco, cefepime, metronidazole) - on PTA posaconazole and valtrex - pancytopenia (WBC = 0.2) - remains febrile - renal function stable - Bcx NGTD  Plan:  Adjust vancomycin to 1gm IV q12h based on age and renal function.  Predicted AUC = 515  Follow Scr  Check vancomycin levels if remains on vancomycin >2 days from now  Continue cefepime 2g IV q8h and flagyl 500 mg IV q8h.  Height: 5\' 4"  (162.6 cm) Weight: 211 lb 14.4 oz (96.1 kg) IBW/kg (Calculated) : 54.7  Temp (24hrs), Avg:100.9 F (38.3 C), Min:98.7 F (37.1 C), Max:103.2 F (39.6 C)  Recent Labs  Lab 07/01/18 0852 07/01/18 2347 07/03/18 0506 07/03/18 0946 07/04/18 0553  WBC 0.5* 0.4*  --  0.2* 0.2*  CREATININE 1.17* 0.95 0.83  --  0.84  LATICACIDVEN  --  1.0  --   --   --     Estimated Creatinine Clearance: 92.2 mL/min (by C-G formula based on SCr of 0.84 mg/dL).    No Known Allergies  Thank you for allowing pharmacy to be a part of this patient's care.  Doreene Eland, PharmD, BCPS.   Work Cell: [phone] 07/04/2018 2:22 PM
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
## 2018-07-04 NOTE — Consult Note (Signed)
PHARMACY CONSULT NOTE - FOLLOW UP  Pharmacy Consult for Electrolyte Monitoring and Replacement   Recent Labs: Potassium (mmol/L)  Date Value  07/04/2018 2.9 (L)  02/06/2011 3.7   Magnesium (mg/dL)  Date Value  07/04/2018 1.5 (L)   Calcium (mg/dL)  Date Value  07/04/2018 8.0 (L)   Calcium, Total (mg/dL)  Date Value  02/06/2011 9.0   Albumin (g/dL)  Date Value  07/01/2018 2.9 (L)  02/06/2011 4.2   Sodium (mmol/L)  Date Value  07/04/2018 134 (L)  02/06/2011 140     Assessment: Pharmacy being consulted to monitor and replace electrolytes on 48 y.o patient.  Patient is currently receiving KCl 53mEq/L in NS IV continuously, but K level continues to drop.  6/18: K 2.9, Mg 1.5  Goal of Therapy:  Electrolytes WNL  Plan:  Hospitalist has ordered KCl 40 mEq PO x 1 dose. Will add additional KCl 10 mEq IV x 2 doses to supplement.  Additionally, hospitalist has ordered Mag Sulfate 2g IV x 1 dose. Will hold off on further supplementation.  Will recheck levels with AM labs and supplement as deemed necessary.  Pearla Dubonnet ,PharmD Clinical Pharmacist 07/04/2018 9:12 AM

## 2018-07-05 DIAGNOSIS — D471 Chronic myeloproliferative disease: Secondary | ICD-10-CM

## 2018-07-05 LAB — CBC
HCT: 17.1 % — ABNORMAL LOW (ref 36.0–46.0)
Hemoglobin: 6 g/dL — ABNORMAL LOW (ref 12.0–15.0)
MCH: 30.2 pg (ref 26.0–34.0)
MCHC: 35.1 g/dL (ref 30.0–36.0)
MCV: 85.9 fL (ref 80.0–100.0)
Platelets: 33 10*3/uL — ABNORMAL LOW (ref 150–400)
RBC: 1.99 MIL/uL — ABNORMAL LOW (ref 3.87–5.11)
RDW: 12.5 % (ref 11.5–15.5)
WBC: 0.2 10*3/uL — CL (ref 4.0–10.5)

## 2018-07-05 LAB — BPAM RBC
Blood Product Expiration Date: 202007082359
Blood Product Expiration Date: 202007082359
ISSUE DATE / TIME: 202006171639
ISSUE DATE / TIME: 202006181451
Unit Type and Rh: 5100
Unit Type and Rh: 5100

## 2018-07-05 LAB — BASIC METABOLIC PANEL
Anion gap: 5 (ref 5–15)
BUN: 9 mg/dL (ref 6–20)
CO2: 29 mmol/L (ref 22–32)
Calcium: 7.5 mg/dL — ABNORMAL LOW (ref 8.9–10.3)
Chloride: 99 mmol/L (ref 98–111)
Creatinine, Ser: 0.75 mg/dL (ref 0.44–1.00)
GFR calc Af Amer: >60 mL/min (ref >60)
GFR calc non Af Amer: >60 mL/min (ref >60)
Glucose, Bld: 131 mg/dL — ABNORMAL HIGH (ref 70–99)
Potassium: 3.5 mmol/L (ref 3.5–5.1)
Sodium: 133 mmol/L — ABNORMAL LOW (ref 135–145)

## 2018-07-05 LAB — CBC WITH DIFFERENTIAL/PLATELET
Abs Immature Granulocytes: 0 10*3/uL (ref 0.00–0.07)
Basophils Absolute: 0 10*3/uL (ref 0.0–0.1)
Basophils Relative: 0 %
Eosinophils Absolute: 0 10*3/uL (ref 0.0–0.5)
Eosinophils Relative: 0 %
HCT: 22.2 % — ABNORMAL LOW (ref 36.0–46.0)
Hemoglobin: 7.8 g/dL — ABNORMAL LOW (ref 12.0–15.0)
Immature Granulocytes: 0 %
Lymphocytes Relative: 52 %
Lymphs Abs: 0.1 10*3/uL — ABNORMAL LOW (ref 0.7–4.0)
MCH: 29 pg (ref 26.0–34.0)
MCHC: 35.1 g/dL (ref 30.0–36.0)
MCV: 82.5 fL (ref 80.0–100.0)
Monocytes Absolute: 0.1 10*3/uL (ref 0.1–1.0)
Monocytes Relative: 40 %
Neutro Abs: 0 10*3/uL — ABNORMAL LOW (ref 1.7–7.7)
Neutrophils Relative %: 8 %
Platelets: 28 10*3/uL — CL (ref 150–400)
RBC: 2.69 MIL/uL — ABNORMAL LOW (ref 3.87–5.11)
RDW: 14.3 % (ref 11.5–15.5)
Smear Review: DECREASED
WBC: 0.3 10*3/uL — CL (ref 4.0–10.5)
nRBC: 0 % (ref 0.0–0.2)

## 2018-07-05 LAB — PHOSPHORUS: Phosphorus: 2.2 mg/dL — ABNORMAL LOW (ref 2.5–4.6)

## 2018-07-05 LAB — TYPE AND SCREEN
ABO/RH(D): O POS
Antibody Screen: NEGATIVE
Unit division: 0
Unit division: 0

## 2018-07-05 LAB — C DIFFICILE QUICK SCREEN W PCR REFLEX
C Diff antigen: NEGATIVE
C Diff interpretation: NOT DETECTED
C Diff toxin: NEGATIVE

## 2018-07-05 LAB — MAGNESIUM: Magnesium: 1.8 mg/dL (ref 1.7–2.4)

## 2018-07-05 LAB — VANCOMYCIN, PEAK: Vancomycin Pk: 33 ug/mL (ref 30–40)

## 2018-07-05 LAB — C-REACTIVE PROTEIN: CRP: 22.3 mg/dL — ABNORMAL HIGH (ref <1.0)

## 2018-07-05 LAB — SEDIMENTATION RATE: Sed Rate: 126 mm/hr — ABNORMAL HIGH (ref 0–20)

## 2018-07-05 MED ORDER — POTASSIUM PHOSPHATE MONOBASIC 500 MG PO TABS
1000.0000 mg | ORAL_TABLET | ORAL | Status: AC
  Administered 2018-07-05 (×2): 1000 mg via ORAL
  Filled 2018-07-05 (×2): qty 2
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
## 2018-07-05 NOTE — Progress Notes (Signed)
Advanced care plan. Purpose of the Encounter: CODE STATUS Parties in Attendance:Patient Patient's Decision Capacity:Good Subjective/Patient's story: The patient with past medical history of AML presents to the emergency department complaining of fever.  The patient received a blood transfusion yesterday as well as chemo.  She felt tired per usual following treatment and went to bed.  Her son who helps with her care checked her temperature and was found to be 103F.  In the emergency department the patient received Tylenol and was observed for some time but developed sepsis criteria which prompted the emergency department staff to call hospitalist service for admission. Objective/Medical story Patient needs evaluation for neutropenic fever.  Needs broad-spectrum antibiotics and cultures.  Needs electrolyte replacement.  Needs infectious disease evaluation and oncology evaluation.  Will need transfusions. Goals of care determination:  Advance care directives goals of care discussed Patient wants everything done which includes cpr and intubation if need arises. CODE STATUS: Full code Time spent discussing advanced care planning: 16 minutes

## 2018-07-05 NOTE — Progress Notes (Addendum)
[place] [facility] [place] NAME: Trenyce Loera    MR#:  [id number]  DATE OF BIRTH:  1970-08-07  SUBJECTIVE:  CHIEF COMPLAINT:   Chief Complaint  Patient presents with  . Fever  Patient seen and evaluated today Had fever last night Has diarrhea No vomitings  REVIEW OF SYSTEMS:    ROS  CONSTITUTIONAL: No documented fever. Has fatigue, weakness. No weight gain, no weight loss.  EYES: No blurry or double vision.  ENT: No tinnitus. No postnasal drip. No redness of the oropharynx.  RESPIRATORY: No cough, no wheeze, no hemoptysis. No dyspnea.  CARDIOVASCULAR: No chest pain. No orthopnea. No palpitations. No syncope.  GASTROINTESTINAL: No nausea, no vomiting. No abdominal pain. No melena or hematochezia. Has diarrhea . GENITOURINARY: No dysuria or hematuria.  ENDOCRINE: No polyuria or nocturia. No heat or cold intolerance.  HEMATOLOGY: No anemia. No bruising. No bleeding.  INTEGUMENTARY: No rashes. No lesions.  MUSCULOSKELETAL: No arthritis. No swelling. No gout.  NEUROLOGIC: No numbness, tingling, or ataxia. No seizure-type activity.  PSYCHIATRIC: No anxiety. No insomnia. No ADD.   DRUG ALLERGIES:  No Known Allergies  VITALS:  Blood pressure (!) 141/90, pulse 96, temperature 98.1 F (36.7 C), temperature source Oral, resp. rate 18, height '5\' 4"'$  (1.626 m), weight 98.1 kg, SpO2 97 %.  PHYSICAL EXAMINATION:   Physical Exam  GENERAL:  48 y.o.-year-old patient lying in the bed with no acute distress.  EYES: Pupils equal, round, reactive to light and accommodation. No scleral icterus. Extraocular muscles intact.  HEENT: Head atraumatic, normocephalic. Oropharynx dry and nasopharynx clear.  NECK:  Supple, no jugular venous distention. No thyroid enlargement, no tenderness.  LUNGS: Normal breath sounds bilaterally, no wheezing, rales, rhonchi. No use of accessory muscles of respiration.  Papular lesions over the chest. CARDIOVASCULAR: S1, S2  normal. No murmurs, rubs, or gallops.  ABDOMEN: Soft, nontender, nondistended. Bowel sounds present. No organomegaly or mass.  EXTREMITIES: No cyanosis, clubbing or edema b/l.    NEUROLOGIC: Cranial nerves II through XII are intact. No focal Motor or sensory deficits b/l.   PSYCHIATRIC: The patient is alert and oriented x 3.  SKIN: papular lesions over the chest     LABORATORY PANEL:   CBC Recent Labs  Lab 07/05/18 0545  WBC 0.3*  HGB 7.8*  HCT 22.2*  PLT 28*   ------------------------------------------------------------------------------------------------------------------ Chemistries  Recent Labs  Lab 07/01/18 2347  07/05/18 0545  NA 132*   < > 133*  K 3.1*   < > 3.5  CL 95*   < > 99  CO2 26   < > 29  GLUCOSE 143*   < > 131*  BUN 14   < > 9  CREATININE 0.95   < > 0.75  CALCIUM 8.3*   < > 7.5*  MG  --    < > 1.8  AST 11*  --   --   ALT 13  --   --   ALKPHOS 100  --   --   BILITOT 1.1  --   --    < > = values in this interval not displayed.   ------------------------------------------------------------------------------------------------------------------  Cardiac Enzymes No results for input(s): TROPONINI in the last 168 hours. ------------------------------------------------------------------------------------------------------------------  RADIOLOGY:  US Spleen (abdomen Limited)  Result Date: 07/04/2018 CLINICAL DATA:  Thrombocytopenia EXAM: ULTRASOUND ABDOMEN LIMITED COMPARISON:  None. FINDINGS: Limited ultrasound of the left upper quadrant. The spleen measures 5.7 x 11.9 by 10.7 cm with a volume of  376.3 mL. IMPRESSION: Mild enlargement of the spleen. Electronically Signed   By: Donavan Foil M.D.   On: 07/04/2018 19:45     ASSESSMENT AND PLAN:   48 year old female patient with history of AML currently not in remission under hospitalist service  -Neutropenic fever Secondary to AML with ongoing treatment We will check CT abdomen with next febrile  episode Continue IV cefepime, Flagyl and vancomycin antibiotics and posaconazole Status post ID evaluation Check ESR and CRP  -Skin lesions Evaluate for sweet syndrome Biopsy needed Dermatology consult placed Called Dermatology office, closed today  -Pancytopenia Monitor hemoglobin hematocrit Transfusion if platelets less than 20 and hemoglobin less than 7.5  -Mucositis Magic mouthwash to continue  -Diarrhea Enteric precautions Check stool for C. difficile toxin  -AML not in remission Continue Valtrex and posaconazole Her venetoclax on hold  -Long-term prognosis poor    All the records are reviewed and case discussed with Care Management/Social Worker. Management plans discussed with the patient, family and they are in agreement.  CODE STATUS: Full code  DVT Prophylaxis: SCDs  TOTAL TIME TAKING CARE OF THIS PATIENT: 38 minutes.   POSSIBLE D/C IN 2 to 3 DAYS, DEPENDING ON CLINICAL CONDITION.  Saundra Shelling M.D on 07/05/2018 at 12:19 PM  Between 7am to 6pm - Pager - [phone]  After 6pm go to [email or web address] - password [email or web address] [facility]  Office  [phone]  CC: Primary care physician; Maryland Pink, MD  Note: This dictation was prepared with Dragon dictation along with smaller phrase technology. Any transcriptional errors that result from this process are unintentional.

## 2018-07-05 NOTE — Progress Notes (Signed)
Date of Admission:  07/01/2018     Rhonda Kane is a 48 y.o. 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 sodium chloride   Intravenous Once  . allopurinol  300 mg Oral Daily  . busPIRone  5 mg Oral BID  . carvedilol  6.25 mg Oral BID WC  . chlorhexidine  15 mL Mouth/Throat BID  . docusate sodium  100 mg Oral BID  . erythromycin   Left Eye Q6H  . feeding supplement (ENSURE ENLIVE)  237 mL Oral TID BM  . losartan  12.5 mg Oral Daily  . pantoprazole  40 mg Oral Daily  . posaconazole  100 mg Oral TID  . sodium chloride flush  10-40 mL Intracatheter Q12H  . valACYclovir  500 mg Oral Daily  . venlafaxine XR  300 mg Oral Daily    Objective: Vital signs in last 24 hours: Temp:  [98 F (36.7 C)-101.7 F (38.7 C)] 100.4 F (38 C) (06/19 9450) Pulse Rate:  [71-99] 98 (06/19 0611) Resp:  [18] 18 (06/18 1900) BP: (106-149)/(67-105) 126/73 (06/19 0611) SpO2:  [95 %-100 %] 95 % (06/19 0611) Weight:  [98.1 kg] 98.1 kg (06/19 0500)  PHYSICAL EXAM:  General: Alert, cooperative, no distress, appears stated age. Looks well, does not look septic Head: Normocephalic, without obvious abnormality, atraumatic. Eyes: Conjunctivae clear, anicteric sclerae. Pupils are equal ENT Nares normal. No drainage or sinus tenderness. Lips, mucosa, and tongue normal. No Thrush Neck: Supple, symmetrical, no adenopathy, thyroid: non tender no carotid bruit and no JVD. Back: No CVA tenderness. Lungs: Clear to auscultation bilaterally. No Wheezing or Rhonchi. No rales. Heart: Regular rate and rhythm, no murmur, rub or gallop. Abdomen: Soft,  non-tender,not distended. Bowel sounds normal. No masses Extremities: atraumatic, no cyanosis. No edema. No clubbing Skin: erythematous nodular lesions on her chest Lymph: Cervical, supraclavicular normal. Neurologic: Grossly non-focal  Lab Results Recent Labs    07/04/18 0553 07/04/18 1513 07/04/18 2133 07/05/18 0545  WBC 0.2*  --   --  0.3*  HGB 6.9*  --  7.8* 7.8*  HCT 19.5*  --  22.2* 22.2*  NA 134*  --   --  133*  K 2.9* 3.8  --  3.5  CL 99  --   --  99  CO2 27  --   --  29  BUN 9  --   --  9  CREATININE 0.84  --   --  0.75   Liver Panel No results for input(s): PROT, ALBUMIN, AST, ALT, ALKPHOS, BILITOT, BILIDIR, IBILI in the last 72 hours. Sedimentation Rate No results for input(s): ESRSEDRATE in the last 72 hours. C-Reactive Protein No results for input(s): CRP in the last 72 hours.  Microbiology:  Studies/Results: US Spleen (abdomen Limited)  Result Date: 07/04/2018 CLINICAL DATA:  Thrombocytopenia EXAM: ULTRASOUND ABDOMEN LIMITED COMPARISON:  None. FINDINGS: Limited ultrasound of the left upper quadrant. The spleen measures 5.7 x 11.9 by 10.7 cm with a volume of 376.3 mL. IMPRESSION: Mild enlargement of the spleen. Electronically Signed   By: Donavan Foil M.D.   On: 07/04/2018 19:45     Assessment/Plan: Febrile neutropenia in a patient with  AML/MDS who is on chemotherapy last received 06/23/2020 07/01/2018. She does not have any pneumonia or line infection or urinary tract infection.  She did have mucositis and some diarrhea.  not anymore. Could she have sweet syndrome causing the fever?  She is currently on vancomycin and cefepime.  Also on metronidazole.will dc metronidazole Continue posaconazole, valacyclovir.  beta D glucan.  fungal antibodies sent- if she becomes afebrile then Iv antibiotics can be changed to prophylactic Po levaquin on discharge as per heme/onc  Erythematous papulonodular eruption- r/o sweet syndrome related to AML - this can cause fever.  Will need biopsy to prove the diagnosis D.D folliculitis-  Chronic neutropenia along with pancytopenia.  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  -- discussed with Dr.yu and Dr. pyreddy ID will follow peripherally this weekend- call if needed

## 2018-07-05 NOTE — Progress Notes (Signed)
Pharmacy Antibiotic Note  Rhonda Kane is a 48 y.o. female admitted on 07/01/2018 with sepsis.  Pharmacy has been consulted for vanc/cefepime dosing. Patient received vanc 1.75g IV load, cefepime 2g IV, flagyl 500 mg IV x 1 in ED.  Today, 07/05/2018 Day #4 antibiotics (vanco, cefepime, metronidazole) - on PTA posaconazole and valtrex - pancytopenia (WBC = 0.2) - remains febrile - renal function stable - Bcx NGTD  Plan:  Will continue vancomycin 1gm IV q12h based on age and renal function.  Predicted AUC = 515  Follow Scr  Vancomycin peak and trough are ordered after scheduled dose at 1600. Will adjust dose if necessary  Continue cefepime 2g IV q8h and flagyl 500 mg IV q8h.  Height: 5\' 4"  (162.6 cm) Weight: 216 lb 4.3 oz (98.1 kg) IBW/kg (Calculated) : 54.7  Temp (24hrs), Avg:99.5 F (37.5 C), Min:98 F (36.7 C), Max:101.7 F (38.7 C)  Recent Labs  Lab 07/01/18 0852 07/01/18 2347 07/03/18 0506 07/03/18 0946 07/04/18 0553 07/05/18 0545  WBC 0.5* 0.4*  --  0.2* 0.2* 0.3*  CREATININE 1.17* 0.95 0.83  --  0.84 0.75  LATICACIDVEN  --  1.0  --   --   --   --     Estimated Creatinine Clearance: 97.9 mL/min (by C-G formula based on SCr of 0.75 mg/dL).    No Known Allergies  Thank you for allowing pharmacy to be a part of this patient's care.  Pearla Dubonnet, PharmD Clinical Pharmacist 07/05/2018 2:30 PM

## 2018-07-05 NOTE — Consult Note (Signed)
PHARMACY CONSULT NOTE - FOLLOW UP  Pharmacy Consult for Electrolyte Monitoring and Replacement   Recent Labs: Potassium (mmol/L)  Date Value  07/05/2018 3.5  02/06/2011 3.7   Magnesium (mg/dL)  Date Value  07/05/2018 1.8   Calcium (mg/dL)  Date Value  07/05/2018 7.5 (L)   Calcium, Total (mg/dL)  Date Value  02/06/2011 9.0   Albumin (g/dL)  Date Value  07/01/2018 2.9 (L)  02/06/2011 4.2   Phosphorus (mg/dL)  Date Value  07/05/2018 2.2 (L)   Sodium (mmol/L)  Date Value  07/05/2018 133 (L)  02/06/2011 140     Assessment: Pharmacy being consulted to monitor and replace electrolytes on 48 y.o patient.  Patient is currently receiving KCl 63mEq/L in NS IV continuously, but K level continues to drop.  6/18: K 2.9, Mg 1.5 6/19: K 3.5 Mg 1.8 Phos 2.2  Goal of Therapy:  Electrolytes WNL  Plan:  Patient currently receiving a continuous infusion of KCl 40 mEq IV in NS. Will order KPhos Neutral 1g Q4 hours x 2 doses to supplement Phosphorous.    Will recheck levels with AM labs and supplement as deemed necessary.  Pearla Dubonnet ,PharmD Clinical Pharmacist 07/05/2018 2:24 PM

## 2018-07-06 LAB — CBC WITH DIFFERENTIAL/PLATELET
Abs Immature Granulocytes: 0 10*3/uL (ref 0.00–0.07)
Basophils Absolute: 0 10*3/uL (ref 0.0–0.1)
Basophils Relative: 0 %
Eosinophils Absolute: 0 10*3/uL (ref 0.0–0.5)
Eosinophils Relative: 0 %
HCT: 22.1 % — ABNORMAL LOW (ref 36.0–46.0)
Hemoglobin: 7.7 g/dL — ABNORMAL LOW (ref 12.0–15.0)
Immature Granulocytes: 0 %
Lymphocytes Relative: 58 %
Lymphs Abs: 0.2 10*3/uL — ABNORMAL LOW (ref 0.7–4.0)
MCH: 29.2 pg (ref 26.0–34.0)
MCHC: 34.8 g/dL (ref 30.0–36.0)
MCV: 83.7 fL (ref 80.0–100.0)
Monocytes Absolute: 0.1 10*3/uL (ref 0.1–1.0)
Monocytes Relative: 38 %
Neutro Abs: 0 10*3/uL — ABNORMAL LOW (ref 1.7–7.7)
Neutrophils Relative %: 4 %
Platelets: 23 10*3/uL — CL (ref 150–400)
RBC: 2.64 MIL/uL — ABNORMAL LOW (ref 3.87–5.11)
RDW: 14.3 % (ref 11.5–15.5)
WBC: 0.3 10*3/uL — CL (ref 4.0–10.5)
nRBC: 0 % (ref 0.0–0.2)

## 2018-07-06 LAB — BASIC METABOLIC PANEL
Anion gap: 8 (ref 5–15)
BUN: 8 mg/dL (ref 6–20)
CO2: 27 mmol/L (ref 22–32)
Calcium: 8 mg/dL — ABNORMAL LOW (ref 8.9–10.3)
Chloride: 104 mmol/L (ref 98–111)
Creatinine, Ser: 0.64 mg/dL (ref 0.44–1.00)
GFR calc Af Amer: >60 mL/min (ref >60)
GFR calc non Af Amer: >60 mL/min (ref >60)
Glucose, Bld: 137 mg/dL — ABNORMAL HIGH (ref 70–99)
Potassium: 3.1 mmol/L — ABNORMAL LOW (ref 3.5–5.1)
Sodium: 139 mmol/L (ref 135–145)

## 2018-07-06 LAB — MAGNESIUM: Magnesium: 1.8 mg/dL (ref 1.7–2.4)

## 2018-07-06 LAB — VANCOMYCIN, TROUGH: Vancomycin Tr: 11 ug/mL — ABNORMAL LOW (ref 15–20)

## 2018-07-06 LAB — PHOSPHORUS: Phosphorus: 3.6 mg/dL (ref 2.5–4.6)

## 2018-07-06 MED ORDER — MAGNESIUM SULFATE 2 GM/50ML IV SOLN
2.0000 g | Freq: Once | INTRAVENOUS | Status: AC
  Administered 2018-07-06: 10:00:00 2 g via INTRAVENOUS
  Filled 2018-07-06: qty 50

## 2018-07-06 MED ORDER — POTASSIUM CHLORIDE CRYS ER 20 MEQ PO TBCR
40.0000 meq | EXTENDED_RELEASE_TABLET | Freq: Once | ORAL | Status: AC
  Administered 2018-07-06: 40 meq via ORAL
  Filled 2018-07-06: qty 2

## 2018-07-06 MED ORDER — PHENOL 1.4 % MT LIQD
1.0000 | OROMUCOSAL | Status: DC | PRN
  Filled 2018-07-06: qty 177

## 2018-07-06 NOTE — Progress Notes (Signed)
[facility] NAME: Rhonda Kane    MR#:  [id number]  DATE OF BIRTH:  1970-11-17  SUBJECTIVE:  CHIEF COMPLAINT:   Chief Complaint  Patient presents with  . Fever  Patient seen and evaluated today Had no fever last night No Diarrhea. No vomitings  REVIEW OF SYSTEMS:    ROS  CONSTITUTIONAL: No documented fever. Has fatigue, weakness. No weight gain, no weight loss.  EYES: No blurry or double vision.  ENT: No tinnitus. No postnasal drip. No redness of the oropharynx.  RESPIRATORY: No cough, no wheeze, no hemoptysis. No dyspnea.  CARDIOVASCULAR: No chest pain. No orthopnea. No palpitations. No syncope.  GASTROINTESTINAL: No nausea, no vomiting. No abdominal pain. No melena or hematochezia. Has diarrhea . GENITOURINARY: No dysuria or hematuria.  ENDOCRINE: No polyuria or nocturia. No heat or cold intolerance.  HEMATOLOGY: No anemia. No bruising. No bleeding.  INTEGUMENTARY: No rashes. No lesions.  MUSCULOSKELETAL: No arthritis. No swelling. No gout.  NEUROLOGIC: No numbness, tingling, or ataxia. No seizure-type activity.  PSYCHIATRIC: No anxiety. No insomnia. No ADD.   DRUG ALLERGIES:  No Known Allergies  VITALS:  Blood pressure 138/82, pulse 84, temperature 97.6 F (36.4 C), temperature source Oral, resp. rate 19, height _0  (1.626 m), weight 93.7 kg, SpO2 95 %.  PHYSICAL EXAMINATION:   Physical Exam  GENERAL:  48 y.o.-year-old patient lying in the bed with no acute distress.  EYES: Pupils equal, round, reactive to light and accommodation. No scleral icterus. Extraocular muscles intact.  HEENT: Head atraumatic, normocephalic. Oropharynx dry and nasopharynx clear.  NECK:  Supple, no jugular venous distention. No thyroid enlargement, no tenderness.  LUNGS: Normal breath sounds bilaterally, no wheezing, rales, rhonchi. No use of accessory muscles of respiration.  Papular lesions over the chest. CARDIOVASCULAR: S1, S2  normal. No murmurs, rubs, or gallops.  ABDOMEN: Soft, nontender, nondistended. Bowel sounds present. No organomegaly or mass.  EXTREMITIES: No cyanosis, clubbing or edema b/l.    NEUROLOGIC: Cranial nerves II through XII are intact. No focal Motor or sensory deficits b/l.   PSYCHIATRIC: The patient is alert and oriented x 3.  SKIN: papular lesions over the chest     LABORATORY PANEL:   CBC Recent Labs  Lab 07/06/18 0446  WBC 0.3*  HGB 7.7*  HCT 22.1*  PLT 23*   ------------------------------------------------------------------------------------------------------------------ Chemistries  Recent Labs  Lab 07/01/18 2347  07/06/18 0446  NA 132*   < > 139  K 3.1*   < > 3.1*  CL 95*   < > 104  CO2 26   < > 27  GLUCOSE 143*   < > 137*  BUN 14   < > 8  CREATININE 0.95   < > 0.64  CALCIUM 8.3*   < > 8.0*  MG  --    < > 1.8  AST 11*  --   --   ALT 13  --   --   ALKPHOS 100  --   --   BILITOT 1.1  --   --    < > = values in this interval not displayed.   ------------------------------------------------------------------------------------------------------------------  Cardiac Enzymes No results for input(s): TROPONINI in the last 168 hours. ------------------------------------------------------------------------------------------------------------------  RADIOLOGY:  US Spleen (abdomen Limited)  Result Date: 07/04/2018 CLINICAL DATA:  Thrombocytopenia EXAM: ULTRASOUND ABDOMEN LIMITED COMPARISON:  None. FINDINGS: Limited ultrasound of the left upper quadrant. The spleen measures 5.7 x 11.9 by 10.7 cm with a volume of  376.3 mL. IMPRESSION: Mild enlargement of the spleen. Electronically Signed   By: Donavan Foil M.D.   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 Continue IV cefepime and vancomycin antibiotics and posaconazole Status post ID evaluation-ID had stopped Flagyl. Checked ESR and CRP  -Skin lesions Evaluate for sweet syndrome Biopsy needed Dermatology consult placed Called Dermatology office on Friday and was closed. As per oncology if patient stays afebrile then no need for skin biopsy and she could be discharged likely in the next 1 day.  -Pancytopenia Monitor hemoglobin hematocrit Transfusion if platelets less than 20 and hemoglobin less than 7.5  -Mucositis Magic mouthwash to continue  -Diarrhea Enteric precautions Negative for C. difficile toxin  -AML not in remission Continue Valtrex and posaconazole Her venetoclax on hold  -Long-term prognosis poor    All the records are reviewed and case discussed with Care Management/Social Worker. Management plans discussed with the patient, family and they are in agreement.  CODE STATUS: Full code  DVT Prophylaxis: SCDs  TOTAL TIME TAKING CARE OF THIS PATIENT: 32 minutes.   POSSIBLE D/C IN 2 to 3 DAYS, DEPENDING ON CLINICAL CONDITION.  Vaughan Basta M.D on 07/06/2018 at 2:46 PM  Between 7am to 6pm - Pager - [phone]  After 6pm go to [email or web address] - password [email or web address] [facility]  Office  [phone]  CC: Primary care physician; Maryland Pink, MD  Note: This dictation was prepared with Dragon dictation along with smaller phrase technology. Any transcriptional errors that result from this process are unintentional.
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
## 2018-07-06 NOTE — Progress Notes (Signed)
Pharmacy Antibiotic Note  Rhonda Kane is a 48 y.o. female admitted on 07/01/2018 with sepsis.  Pharmacy has been consulted for vanc/cefepime dosing. Patient received vanc 1.75g IV load, cefepime 2g IV, flagyl 500 mg IV x 1 in ED.  Today, 07/06/2018 Day #4 antibiotics (vanco, cefepime, metronidazole) - on PTA posaconazole and valtrex - pancytopenia (WBC = 0.2) - remains febrile - renal function stable - Bcx NGTD  Plan:  Peak 33   Trough 11  AUC 515.4  Will continue current regimen as it provides an adequate AUC and a Cssmin above 10 mcg/mL.  Renal function also improving will continue to monitor.  Height: 5\' 4"  (162.6 cm) Weight: 206 lb 9.6 oz (93.7 kg) IBW/kg (Calculated) : 54.7  Temp (24hrs), Avg:98.7 F (37.1 C), Min:97.6 F (36.4 C), Max:100.4 F (38 C)  Recent Labs  Lab 07/01/18 0852 07/01/18 2347 07/03/18 0506 07/03/18 0946 07/04/18 0553 07/05/18 0545 07/05/18 1756 07/06/18 0446  WBC 0.5* 0.4*  --  0.2* 0.2* 0.3*  --   --   CREATININE 1.17* 0.95 0.83  --  0.84 0.75  --  0.64  LATICACIDVEN  --  1.0  --   --   --   --   --   --   VANCOTROUGH  --   --   --   --   --   --   --  11*  VANCOPEAK  --   --   --   --   --   --  33  --     Estimated Creatinine Clearance: 95.4 mL/min (by C-G formula based on SCr of 0.64 mg/dL).    No Known Allergies  Thank you for allowing pharmacy to be a part of this patient's care.  Tobie Lords, PharmD Clinical Pharmacist 07/06/2018 5:46 AM

## 2018-07-06 NOTE — Progress Notes (Signed)
Pharmacy Electrolyte Monitoring Consult:  Pharmacy consulted to assist in monitoring and replacing electrolytes in this 48 y.o. female admitted on 07/01/2018. Patient admitted with neutropenic fever. Patient with AML and received last chemotherapy treatment on 6/15.   Labs:  Sodium (mmol/L)  Date Value  07/06/2018 139  02/06/2011 140   Potassium (mmol/L)  Date Value  07/06/2018 3.1 (L)  02/06/2011 3.7   Magnesium (mg/dL)  Date Value  07/06/2018 1.8   Phosphorus (mg/dL)  Date Value  07/06/2018 3.6   Calcium (mg/dL)  Date Value  07/06/2018 8.0 (L)   Calcium, Total (mg/dL)  Date Value  02/06/2011 9.0   Albumin (g/dL)  Date Value  07/01/2018 2.9 (L)  02/06/2011 4.2    Assessment/Plan: Patient receiving NS/68mEq of potassium at 2mL/hr.   Patient received Kphos 1g PO X 2 doses on 6/19. Will order potassium 23mEq PO x 1 and magnesium 2g IV x 1.   Will obtain follow-up BMP/Magnesium with am labs on 6/21.   Will replace to maintain electrolytes within normal limits.   Pharmacy will continue to monitor and adjust per consult.   Simpson,Michael L 07/06/2018 7:57 AM

## 2018-07-07 LAB — CBC
HCT: 23.2 % — ABNORMAL LOW (ref 36.0–46.0)
Hemoglobin: 7.9 g/dL — ABNORMAL LOW (ref 12.0–15.0)
MCH: 28.7 pg (ref 26.0–34.0)
MCHC: 34.1 g/dL (ref 30.0–36.0)
MCV: 84.4 fL (ref 80.0–100.0)
Platelets: 29 10*3/uL — CL (ref 150–400)
RBC: 2.75 MIL/uL — ABNORMAL LOW (ref 3.87–5.11)
RDW: 14.2 % (ref 11.5–15.5)
WBC: 0.5 10*3/uL — CL (ref 4.0–10.5)
nRBC: 0 % (ref 0.0–0.2)

## 2018-07-07 LAB — BASIC METABOLIC PANEL
Anion gap: 11 (ref 5–15)
BUN: 6 mg/dL (ref 6–20)
CO2: 24 mmol/L (ref 22–32)
Calcium: 7.7 mg/dL — ABNORMAL LOW (ref 8.9–10.3)
Chloride: 99 mmol/L (ref 98–111)
Creatinine, Ser: 0.56 mg/dL (ref 0.44–1.00)
GFR calc Af Amer: >60 mL/min (ref >60)
GFR calc non Af Amer: >60 mL/min (ref >60)
Glucose, Bld: 116 mg/dL — ABNORMAL HIGH (ref 70–99)
Potassium: 3.4 mmol/L — ABNORMAL LOW (ref 3.5–5.1)
Sodium: 134 mmol/L — ABNORMAL LOW (ref 135–145)

## 2018-07-07 LAB — CULTURE, BLOOD (ROUTINE X 2)
Culture: NO GROWTH
Culture: NO GROWTH
Special Requests: ADEQUATE

## 2018-07-07 LAB — MAGNESIUM: Magnesium: 2 mg/dL (ref 1.7–2.4)

## 2018-07-07 LAB — PHOSPHORUS: Phosphorus: 3.4 mg/dL (ref 2.5–4.6)

## 2018-07-07 MED ORDER — POTASSIUM CHLORIDE CRYS ER 20 MEQ PO TBCR: 40.0000 meq | EXTENDED_RELEASE_TABLET | Freq: Once | ORAL | Status: DC

## 2018-07-07 MED ORDER — LEVOFLOXACIN 500 MG PO TABS: 500.0000 mg | ORAL_TABLET | Freq: Every day | ORAL | 0 refills | Status: AC

## 2018-07-07 MED ORDER — ERYTHROMYCIN 5 MG/GM OP OINT: TOPICAL_OINTMENT | Freq: Four times a day (QID) | OPHTHALMIC | 0 refills | Status: AC

## 2018-07-07 NOTE — TOC Transition Note (Signed)
Transition of Care [facility]) - CM/SW Discharge Note   Patient Details  Name: Rhonda Kane MRN: [id number] Date of Birth: [birth year]  Transition of [facility]) CM/SW Contact:  Ross Ludwig, LCSW Phone Number: 07/07/2018, 3:35 PM   Clinical Narrative:     CSW provided patient with a coupon for erythromycin through [facility].  Patient did not express any other concerns or issues, she stated she will have insurance on 07/17/18 through [facility] [place] signing off.  Final next level of care: Home/Self Care Barriers to Discharge: No Barriers Identified   Patient Goals and CMS Choice Patient states their goals for this hospitalization and ongoing recovery are:: To return back home      Discharge Placement                       Discharge Plan and Services   Discharge Planning Services: CM Consult            DME Arranged: N/A DME Agency: NA       HH Arranged: NA HH Agency: NA     Representative spoke with at [place]: na  Social Determinants of Health (SDOH) Interventions     Readmission Risk Interventions Readmission Risk Prevention Plan 07/07/2018  Transportation Screening Complete  Medication Review [employer]) Complete  PCP or Specialist appointment within 3-5 days of discharge Complete  HRI or [place] Complete  SW Recovery Care/Counseling Consult Complete  [place] Not Applicable  Some recent data might be hidden

## 2018-07-07 NOTE — Progress Notes (Signed)
Pt woke up at 0400, left eye swollen so much it was almost swollen shut. Gave pt an ice pack to help with swelling, a dose of PRN tramadol for an ongoing "sinus" headache, and paged on call provider for further recommendations. Currently awaiting call back.

## 2018-07-07 NOTE — Discharge Summary (Signed)
[facility] [place] [facility]   PATIENT NAME: Rhonda Kane    MR#:  [id number]  DATE OF BIRTH:  08/18/1970  DATE OF ADMISSION:  07/01/2018 ADMITTING PHYSICIAN: Michael S Diamond, MD  DATE OF DISCHARGE: 07/07/2018   PRIMARY CARE PHYSICIAN: Hedrick, James, MD    ADMISSION DIAGNOSIS:  Fever  DISCHARGE DIAGNOSIS:  Active Problems:   Neutropenic fever (HCC)   SECONDARY DIAGNOSIS:   Past Medical History:  Diagnosis Date  . Breast mass, right 2015  . Cancer (HCC)    AML  . Depression   . Dysmenorrhea   . GERD (gastroesophageal reflux disease)   . Headache    MIGRAINES  . Insomnia   . Insulin resistance   . Irregular menses   . Leukemia (HCC)   . Menometrorrhagia   . Migraine with aura   . PMS (premenstrual syndrome)   . 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 Continue IV cefepime and vancomycin antibiotics and posaconazole Status post ID evaluation-ID had stopped Flagyl. Checked ESR and CRP As afebrile > 48 hrs- oncologist suggest to d/c home with oral levaquin and follow ion clinic next week.  -Skin lesions Evaluate for sweet syndrome Biopsy needed Dermatology consult placed Called Dermatology office on Friday and was closed. As per oncology as patient stays afebrile then no need for skin biopsy and she could be discharged.  -Pancytopenia Monitor hemoglobin hematocrit Transfusion if platelets less than 20 and hemoglobin less than 7.5- remained stable.  -Mucositis Magic mouthwash to continue  -Diarrhea Enteric precautions Negative for C. difficile toxin  -AML not in remission Continue Valtrex and posaconazole Her venetoclax on  hold  -Long-term prognosis poor  - left eyelid swelling, no injuries/ redness.   Eye movements are normal, vision normal. Non tender on palpation.  Will cont erythromycin application on discharge. DISCHARGE CONDITIONS:   Stable.  CONSULTS OBTAINED:  Treatment Team:  Finnegan, Timothy J, MD Ravishankar, Jayashree, MD  DRUG ALLERGIES:  No Known Allergies  DISCHARGE MEDICATIONS:   Allergies as of 07/07/2018   No Known Allergies     Medication List    STOP taking these medications   clindamycin 150 MG capsule Commonly known as: CLEOCIN     TAKE these medications   allopurinol 300 MG tablet Commonly known as: ZYLOPRIM Take 300 mg by mouth daily.   busPIRone 5 MG tablet Commonly known as: BUSPAR Take 1 tablet (5 mg total) by mouth 2 (two) times daily.   carvedilol 6.25 MG tablet Commonly known as: COREG Take 6.25 mg by mouth 2 (two) times daily.   chlorhexidine 0.12 % solution Commonly known as: Peridex Use as directed 15 mLs in the mouth or throat 2 (two) times daily.   cyclobenzaprine 5 MG tablet Commonly known as: FLEXERIL Take 1 tablet (5 mg total) by mouth daily as needed for muscle spasms.   erythromycin ophthalmic ointment Place into the left eye every 6 (six) hours.   ibuprofen 200 MG tablet Commonly known as: ADVIL Take 600 mg by mouth daily as needed for fever, headache or mild pain.   levofloxacin 500 MG tablet Commonly known as: LEVAQUIN Take 1 tablet (500 mg total) by mouth daily for 10 days.   lidocaine-prilocaine cream Commonly   known as: EMLA Apply to affected area once   losartan 25 MG tablet Commonly known as: COZAAR Take 12.5 mg by mouth daily.   magic mouthwash w/lidocaine Soln Take 5 mLs by mouth 4 (four) times daily as needed for mouth pain.   metoprolol succinate 25 MG 24 hr tablet Commonly known as: TOPROL-XL Take 25 mg by mouth daily.   mupirocin ointment 2 % Commonly known as: Bactroban Place 1 application into the  nose 3 (three) times daily.   ondansetron 8 MG tablet Commonly known as: Zofran Take 1 tablet (8 mg total) by mouth 2 (two) times daily as needed (Nausea or vomiting).   pantoprazole 40 MG tablet Commonly known as: PROTONIX Take 40 mg by mouth daily.   posaconazole 100 MG Tbec delayed-release tablet Commonly known as: NOXAFIL Take 100 mg by mouth 3 (three) times daily.   Potassium 99 MG Tabs Take 99 mg by mouth daily.   prochlorperazine 10 MG tablet Commonly known as: COMPAZINE Take 1 tablet (10 mg total) by mouth every 6 (six) hours as needed (Nausea or vomiting).   traMADol 50 MG tablet Commonly known as: ULTRAM Take 50 mg by mouth every 6 (six) hours as needed for moderate pain.   traZODone 50 MG tablet Commonly known as: DESYREL Take 50 mg by mouth at bedtime as needed for sleep.   valACYclovir 500 MG tablet Commonly known as: VALTREX Take 500 mg by mouth daily.   venetoclax 100 MG Tabs Take 100 mg by mouth daily. Take with a meal and water. Do not chew, crush, or break tablets.   venlafaxine XR 150 MG 24 hr capsule Commonly known as: EFFEXOR-XR Take 2 capsules (300 mg total) by mouth daily.        DISCHARGE INSTRUCTIONS:   Follow with Oncology clinic in next week.  If you experience worsening of your admission symptoms, develop shortness of breath, life threatening emergency, suicidal or homicidal thoughts you must seek medical attention immediately by calling 911 or calling your MD immediately  if symptoms less severe.  You Must read complete instructions/literature along with all the possible adverse reactions/side effects for all the Medicines you take and that have been prescribed to you. Take any new Medicines after you have completely understood and accept all the possible adverse reactions/side effects.   Please note  You were cared for by a hospitalist during your hospital stay. If you have any questions about your discharge medications or the care  you received while you were in the hospital after you are discharged, you can call the unit and asked to speak with the hospitalist on call if the hospitalist that took care of you is not available. Once you are discharged, your primary care physician will handle any further medical issues. Please note that NO REFILLS for any discharge medications will be authorized once you are discharged, as it is imperative that you return to your primary care physician (or establish a relationship with a primary care physician if you do not have one) for your aftercare needs so that they can reassess your need for medications and monitor your lab values.    Today   CHIEF COMPLAINT:   Chief Complaint  Patient presents with  . Fever    HISTORY OF PRESENT ILLNESS:  Jalene Kern  is a 48 y.o. female with a known history of AML presents to the emergency department complaining of fever.  The patient received a blood transfusion yesterday as well as chemo.  She   felt tired per usual following treatment and went to bed.  Her son who helps with her care checked her temperature and was found to be 103F.  In the emergency department the patient received Tylenol and was observed for some time but developed sepsis criteria which prompted the emergency department staff to call hospitalist service for admission.   VITAL SIGNS:  Blood pressure 140/83, pulse 81, temperature 98.5 F (36.9 C), temperature source Oral, resp. rate 17, height 5' 4" (1.626 m), weight 98.2 kg, SpO2 95 %.  I/O:    Intake/Output Summary (Last 24 hours) at 07/07/2018 1242 Last data filed at 07/07/2018 0615 Gross per 24 hour  Intake 2833.21 ml  Output -  Net 2833.21 ml    PHYSICAL EXAMINATION:   GENERAL:  48 y.o.-year-old patient lying in the bed with no acute distress.  EYES: Pupils equal, round, reactive to light and accommodation. No scleral icterus. Extraocular muscles intact.  HEENT: Head atraumatic, normocephalic. Oropharynx dry  and nasopharynx clear.  NECK:  Supple, no jugular venous distention. No thyroid enlargement, no tenderness.  LUNGS: Normal breath sounds bilaterally, no wheezing, rales, rhonchi. No use of accessory muscles of respiration.  Papular lesions over the chest. CARDIOVASCULAR: S1, S2 normal. No murmurs, rubs, or gallops.  ABDOMEN: Soft, nontender, nondistended. Bowel sounds present. No organomegaly or mass.  EXTREMITIES: No cyanosis, clubbing or edema b/l.    NEUROLOGIC: Cranial nerves II through XII are intact. No focal Motor or sensory deficits b/l.   PSYCHIATRIC: The patient is alert and oriented x 3.  SKIN: papular lesions over the chest  DATA REVIEW:   CBC Recent Labs  Lab 07/07/18 0436  WBC 0.5*  HGB 7.9*  HCT 23.2*  PLT 29*    Chemistries  Recent Labs  Lab 07/01/18 2347  07/07/18 0436  NA 132*   < > 134*  K 3.1*   < > 3.4*  CL 95*   < > 99  CO2 26   < > 24  GLUCOSE 143*   < > 116*  BUN 14   < > 6  CREATININE 0.95   < > 0.56  CALCIUM 8.3*   < > 7.7*  MG  --    < > 2.0  AST 11*  --   --   ALT 13  --   --   ALKPHOS 100  --   --   BILITOT 1.1  --   --    < > = values in this interval not displayed.    Cardiac Enzymes No results for input(s): TROPONINI in the last 168 hours.  Microbiology Results  Results for orders placed or performed during the hospital encounter of 07/01/18  SARS Coronavirus 2 (CEPHEID- Performed in [place] hospital lab), Hosp Order     Status: None   Collection Time: 07/02/18 12:24 AM   Specimen: Nasopharyngeal Swab  Result Value Ref Range Status   SARS Coronavirus 2 NEGATIVE NEGATIVE Final    Comment: (NOTE) If result is NEGATIVE SARS-CoV-2 target nucleic acids are NOT DETECTED. The SARS-CoV-2 RNA is generally detectable in upper and lower  respiratory specimens during the acute phase of infection. The lowest  concentration of SARS-CoV-2 viral copies this assay can detect is 250  copies / mL. A negative result does not preclude  SARS-CoV-2 infection  and should not be used as the sole basis for treatment or other  patient management decisions.  A negative result may occur with  improper specimen collection / handling,   submission of specimen other  than nasopharyngeal swab, presence of viral mutation(s) within the  areas targeted by this assay, and inadequate number of viral copies  (<250 copies / mL). A negative result must be combined with clinical  observations, patient history, and epidemiological information. If result is POSITIVE SARS-CoV-2 target nucleic acids are DETECTED. The SARS-CoV-2 RNA is generally detectable in upper and lower  respiratory specimens dur ing the acute phase of infection.  Positive  results are indicative of active infection with SARS-CoV-2.  Clinical  correlation with patient history and other diagnostic information is  necessary to determine patient infection status.  Positive results do  not rule out bacterial infection or co-infection with other viruses. If result is PRESUMPTIVE POSTIVE SARS-CoV-2 nucleic acids MAY BE PRESENT.   A presumptive positive result was obtained on the submitted specimen  and confirmed on repeat testing.  While 2019 novel coronavirus  (SARS-CoV-2) nucleic acids may be present in the submitted sample  additional confirmatory testing may be necessary for epidemiological  and / or clinical management purposes  to differentiate between  SARS-CoV-2 and other Sarbecovirus currently known to infect humans.  If clinically indicated additional testing with an alternate test  methodology [phone]) is advised. The SARS-CoV-2 RNA is generally  detectable in upper and lower respiratory sp ecimens during the acute  phase of infection. The expected result is Negative. Fact Sheet for Patients:  StrictlyIdeas.no Fact Sheet for Healthcare Providers: BankingDealers.co.za This test is not yet approved or cleared by the  [place] FDA and has been authorized for detection and/or diagnosis of SARS-CoV-2 by FDA under an Emergency Use Authorization (EUA).  This EUA will remain in effect (meaning this test can be used) for the duration of the COVID-19 declaration under Section 564(b)(1) of the Act, 21 U.S.C. section 360bbb-3(b)(1), unless the authorization is terminated or revoked sooner. Performed at [facility], [address], [place] [address]   Blood culture (routine x 2)     Status: None   Collection Time: 07/02/18 12:24 AM   Specimen: BLOOD  Result Value Ref Range Status   Specimen Description BLOOD BLOOD LEFT HAND  Final   Special Requests   Final    BOTTLES DRAWN AEROBIC AND ANAEROBIC Blood Culture adequate volume   Culture   Final    NO GROWTH 5 DAYS Performed at [facility], [address] [place], [place] [address]    Report Status 07/07/2018 FINAL  Final  Blood culture (routine x 2)     Status: None   Collection Time: 07/02/18 12:24 AM   Specimen: BLOOD  Result Value Ref Range Status   Specimen Description BLOOD LEFT ANTECUBITAL  Final   Special Requests   Final    BOTTLES DRAWN AEROBIC AND ANAEROBIC Blood Culture results may not be optimal due to an excessive volume of blood received in culture bottles   Culture   Final    NO GROWTH 5 DAYS Performed at [facility], [address] [place], [place] [address]    Report Status 07/07/2018 FINAL  Final  Gastrointestinal Panel by PCR , Stool     Status: None   Collection Time: 07/03/18 11:25 AM   Specimen: Stool  Result Value Ref Range Status   Campylobacter species NOT DETECTED NOT DETECTED Final   Plesimonas shigelloides NOT DETECTED NOT DETECTED Final   Salmonella species NOT DETECTED NOT DETECTED Final   Yersinia enterocolitica NOT DETECTED NOT DETECTED Final   Vibrio species NOT  DETECTED NOT DETECTED Final   Vibrio cholerae NOT DETECTED NOT DETECTED Final   Enteroaggregative E coli  (EAEC) NOT DETECTED NOT DETECTED Final   Enteropathogenic E coli (EPEC) NOT DETECTED NOT DETECTED Final   Enterotoxigenic E coli (ETEC) NOT DETECTED NOT DETECTED Final   Shiga like toxin producing E coli (STEC) NOT DETECTED NOT DETECTED Final   Shigella/Enteroinvasive E coli (EIEC) NOT DETECTED NOT DETECTED Final   Cryptosporidium NOT DETECTED NOT DETECTED Final   Cyclospora cayetanensis NOT DETECTED NOT DETECTED Final   Entamoeba histolytica NOT DETECTED NOT DETECTED Final   Giardia lamblia NOT DETECTED NOT DETECTED Final   Adenovirus F40/41 NOT DETECTED NOT DETECTED Final   Astrovirus NOT DETECTED NOT DETECTED Final   Norovirus GI/GII NOT DETECTED NOT DETECTED Final   Rotavirus A NOT DETECTED NOT DETECTED Final   Sapovirus (I, II, IV, and V) NOT DETECTED NOT DETECTED Final    Comment: Performed at [facility], [address] [place], [place] [address]  Respiratory Panel by PCR     Status: None   Collection Time: 07/03/18  1:13 PM   Specimen: Nasopharyngeal Swab; Respiratory  Result Value Ref Range Status   Adenovirus NOT DETECTED NOT DETECTED Final   Coronavirus 229E NOT DETECTED NOT DETECTED Final    Comment: (NOTE) The Coronavirus on the Respiratory Panel, DOES NOT test for the novel  Coronavirus (2019 nCoV)    Coronavirus HKU1 NOT DETECTED NOT DETECTED Final   Coronavirus NL63 NOT DETECTED NOT DETECTED Final   Coronavirus OC43 NOT DETECTED NOT DETECTED Final   Metapneumovirus NOT DETECTED NOT DETECTED Final   Rhinovirus / Enterovirus NOT DETECTED NOT DETECTED Final   Influenza A NOT DETECTED NOT DETECTED Final   Influenza B NOT DETECTED NOT DETECTED Final   Parainfluenza Virus 1 NOT DETECTED NOT DETECTED Final   Parainfluenza Virus 2 NOT DETECTED NOT DETECTED Final   Parainfluenza Virus 3 NOT DETECTED NOT DETECTED Final   Parainfluenza Virus 4 NOT DETECTED NOT DETECTED Final   Respiratory Syncytial Virus NOT DETECTED NOT DETECTED Final   Bordetella pertussis NOT  DETECTED NOT DETECTED Final   Chlamydophila pneumoniae NOT DETECTED NOT DETECTED Final   Mycoplasma pneumoniae NOT DETECTED NOT DETECTED Final    Comment: Performed at [facility], [place]. [address] [place], [place] [address]  MRSA PCR Screening     Status: None   Collection Time: 07/04/18 12:50 PM   Specimen: Nasal Mucosa; Nasopharyngeal  Result Value Ref Range Status   MRSA by PCR NEGATIVE NEGATIVE Final    Comment:        The GeneXpert MRSA Assay (FDA approved for NASAL specimens only), is one component of a comprehensive MRSA colonization surveillance program. It is not intended to diagnose MRSA infection nor to guide or monitor treatment for MRSA infections. Performed at [facility], [address] [place], [place] [address]   C difficile quick scan w PCR reflex     Status: None   Collection Time: 07/05/18  6:33 PM   Specimen: STOOL  Result Value Ref Range Status   C Diff antigen NEGATIVE NEGATIVE Final   C Diff toxin NEGATIVE NEGATIVE Final   C Diff interpretation No C. difficile detected.  Final    Comment: Performed at [facility], [address] [place], [place] [address]    RADIOLOGY:  No results found.  EKG:   Orders placed or performed during the hospital encounter of 07/01/18  . ED EKG 12-Lead  .  ED EKG 12-Lead  . EKG 12-Lead  . EKG 12-Lead      Management plans discussed with the patient, family and they are in agreement.  CODE STATUS:     Code Status Orders  (From admission, onward)         Start     Ordered   07/02/18 0348  Full code  Continuous     07/02/18 0347        Code Status History    Date Active Date Inactive Code Status Order ID Comments User Context   04/09/2018 1521 04/14/2018 1654 Full Code [id number]  Salary, Montell D, MD Inpatient   02/02/2018 1610 02/03/2018 1602 Full Code [id number]  Chen, Qing, MD Inpatient   12/13/2017 2243 12/15/2017 1611 DNR [id number]  Chen, Qing, MD Inpatient   Advance Care  Planning Activity      TOTAL TIME TAKING CARE OF THIS PATIENT: 35 minutes.    Vaibhavkumar Vachhani M.D on 07/07/2018 at 12:42 PM  Between 7am to 6pm - Pager - [phone]  After 6pm go to [email or web address] - password [email or web address]  [facility]  Office  [phone]  CC: Primary care physician; Hedrick, James, MD   Note: This dictation was prepared with Dragon dictation along with smaller phrase technology. Any transcriptional errors that result from this process are unintentional.   

## 2018-07-07 NOTE — Progress Notes (Signed)
Pharmacy Electrolyte Monitoring Consult:  Pharmacy consulted to assist in monitoring and replacing electrolytes in this 48 y.o. female admitted on 07/01/2018. Patient admitted with neutropenic fever. Patient with AML and received last chemotherapy treatment on 6/15.   Labs:  Sodium (mmol/L)  Date Value  07/07/2018 134 (L)  02/06/2011 140   Potassium (mmol/L)  Date Value  07/07/2018 3.4 (L)  02/06/2011 3.7   Magnesium (mg/dL)  Date Value  07/07/2018 2.0   Phosphorus (mg/dL)  Date Value  07/07/2018 3.4   Calcium (mg/dL)  Date Value  07/07/2018 7.7 (L)   Calcium, Total (mg/dL)  Date Value  02/06/2011 9.0   Albumin (g/dL)  Date Value  07/01/2018 2.9 (L)  02/06/2011 4.2   Corrected Calcium: 8.6  Assessment/Plan: Patient receiving NS/64mEq of potassium at 60mL/hr.   Will order potassium 30mEq PO x 1.  Will obtain follow-up electrolytes with am labs on 6/22.   Will replace to maintain electrolytes within normal limits.   Pharmacy will continue to monitor and adjust per consult.   Jolina Symonds L 07/07/2018 9:56 AM

## 2018-07-07 NOTE — Clinical Social Work Note (Addendum)
CSW provided patient with a coupon for erythromycin through [employer].  Patient did not express any other concerns or issues, she stated she will have insurance on 07/17/18 through [facility] [place] signing off.  Jones Broom. [place], MSW, [place]  07/07/2018 3:30 PM
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
## 2018-07-08 ENCOUNTER — Inpatient Hospital Stay: Payer: Self-pay

## 2018-07-08 ENCOUNTER — Other Ambulatory Visit: Payer: Self-pay

## 2018-07-08 ENCOUNTER — Encounter: Payer: Self-pay | Admitting: Pharmacy Technician

## 2018-07-08 ENCOUNTER — Other Ambulatory Visit: Payer: Self-pay | Admitting: Ophthalmology

## 2018-07-08 ENCOUNTER — Encounter: Payer: Self-pay | Admitting: Oncology

## 2018-07-08 ENCOUNTER — Inpatient Hospital Stay (HOSPITAL_BASED_OUTPATIENT_CLINIC_OR_DEPARTMENT_OTHER): Payer: Self-pay | Admitting: Oncology

## 2018-07-08 VITALS — BP 126/82 | HR 89 | Temp 95.9°F | Resp 18 | Wt 207.6 lb

## 2018-07-08 DIAGNOSIS — D649 Anemia, unspecified: Secondary | ICD-10-CM

## 2018-07-08 DIAGNOSIS — C9200 Acute myeloblastic leukemia, not having achieved remission: Secondary | ICD-10-CM

## 2018-07-08 DIAGNOSIS — E876 Hypokalemia: Secondary | ICD-10-CM

## 2018-07-08 DIAGNOSIS — G4700 Insomnia, unspecified: Secondary | ICD-10-CM

## 2018-07-08 DIAGNOSIS — E860 Dehydration: Secondary | ICD-10-CM | POA: Diagnosis not present

## 2018-07-08 DIAGNOSIS — Z79899 Other long term (current) drug therapy: Secondary | ICD-10-CM | POA: Diagnosis not present

## 2018-07-08 DIAGNOSIS — S0012XA Contusion of left eyelid and periocular area, initial encounter: Secondary | ICD-10-CM

## 2018-07-08 DIAGNOSIS — D696 Thrombocytopenia, unspecified: Secondary | ICD-10-CM

## 2018-07-08 DIAGNOSIS — Z95828 Presence of other vascular implants and grafts: Secondary | ICD-10-CM

## 2018-07-08 DIAGNOSIS — F329 Major depressive disorder, single episode, unspecified: Secondary | ICD-10-CM | POA: Diagnosis not present

## 2018-07-08 DIAGNOSIS — Z452 Encounter for adjustment and management of vascular access device: Secondary | ICD-10-CM

## 2018-07-08 DIAGNOSIS — K1231 Oral mucositis (ulcerative) due to antineoplastic therapy: Secondary | ICD-10-CM

## 2018-07-08 DIAGNOSIS — D6481 Anemia due to antineoplastic chemotherapy: Secondary | ICD-10-CM

## 2018-07-08 DIAGNOSIS — Z791 Long term (current) use of non-steroidal anti-inflammatories (NSAID): Secondary | ICD-10-CM | POA: Diagnosis not present

## 2018-07-08 DIAGNOSIS — H5789 Other specified disorders of eye and adnexa: Secondary | ICD-10-CM

## 2018-07-08 DIAGNOSIS — Z5111 Encounter for antineoplastic chemotherapy: Secondary | ICD-10-CM | POA: Diagnosis present

## 2018-07-08 DIAGNOSIS — L739 Follicular disorder, unspecified: Secondary | ICD-10-CM

## 2018-07-08 LAB — BASIC METABOLIC PANEL
Anion gap: 10 (ref 5–15)
BUN: 7 mg/dL (ref 6–20)
CO2: 30 mmol/L (ref 22–32)
Calcium: 8.6 mg/dL — ABNORMAL LOW (ref 8.9–10.3)
Chloride: 98 mmol/L (ref 98–111)
Creatinine, Ser: 0.72 mg/dL (ref 0.44–1.00)
GFR calc Af Amer: >60 mL/min (ref >60)
GFR calc non Af Amer: >60 mL/min (ref >60)
Glucose, Bld: 137 mg/dL — ABNORMAL HIGH (ref 70–99)
Potassium: 3 mmol/L — ABNORMAL LOW (ref 3.5–5.1)
Sodium: 138 mmol/L (ref 135–145)

## 2018-07-08 LAB — CBC WITH DIFFERENTIAL/PLATELET
Abs Immature Granulocytes: 0 10*3/uL (ref 0.00–0.07)
Basophils Absolute: 0 10*3/uL (ref 0.0–0.1)
Basophils Relative: 0 %
Eosinophils Absolute: 0 10*3/uL (ref 0.0–0.5)
Eosinophils Relative: 0 %
HCT: 25.9 % — ABNORMAL LOW (ref 36.0–46.0)
Hemoglobin: 8.7 g/dL — ABNORMAL LOW (ref 12.0–15.0)
Immature Granulocytes: 0 %
Lymphocytes Relative: 49 %
Lymphs Abs: 0.4 10*3/uL — ABNORMAL LOW (ref 0.7–4.0)
MCH: 28.7 pg (ref 26.0–34.0)
MCHC: 33.6 g/dL (ref 30.0–36.0)
MCV: 85.5 fL (ref 80.0–100.0)
Monocytes Absolute: 0.3 10*3/uL (ref 0.1–1.0)
Monocytes Relative: 43 %
Neutro Abs: 0.1 10*3/uL — ABNORMAL LOW (ref 1.7–7.7)
Neutrophils Relative %: 8 %
Platelets: 40 10*3/uL — ABNORMAL LOW (ref 150–400)
RBC: 3.03 MIL/uL — ABNORMAL LOW (ref 3.87–5.11)
RDW: 13.8 % (ref 11.5–15.5)
Smear Review: NORMAL
WBC: 0.7 10*3/uL — CL (ref 4.0–10.5)
nRBC: 0 % (ref 0.0–0.2)

## 2018-07-08 LAB — SAMPLE TO BLOOD BANK

## 2018-07-08 MED ORDER — POTASSIUM CHLORIDE 10 MEQ/100ML IV SOLN: 10.0000 meq | Freq: Once | INTRAVENOUS | Status: DC

## 2018-07-08 MED ORDER — SODIUM CHLORIDE 0.9 % IV SOLN
Freq: Once | INTRAVENOUS | Status: AC
  Administered 2018-07-08: 10:00:00 via INTRAVENOUS
  Filled 2018-07-08: qty 250

## 2018-07-08 MED ORDER — SODIUM CHLORIDE 0.9% FLUSH
10.0000 mL | Freq: Once | INTRAVENOUS | Status: DC
  Filled 2018-07-08: qty 10

## 2018-07-08 MED ORDER — SODIUM CHLORIDE 0.9 % IV SOLN
Freq: Once | INTRAVENOUS | Status: AC
  Administered 2018-07-08: 11:00:00 via INTRAVENOUS
  Filled 2018-07-08: qty 100

## 2018-07-08 MED ORDER — POTASSIUM CHLORIDE CRYS ER 20 MEQ PO TBCR: 20.0000 meq | EXTENDED_RELEASE_TABLET | Freq: Every day | ORAL | 0 refills | Status: AC

## 2018-07-08 NOTE — Progress Notes (Signed)
Patient here for follow up. Pt;s left eye is very swollen, but patient denies pain to site.
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
## 2018-07-08 NOTE — Addendum Note (Signed)
Addended by: Earlie Server on: 07/08/2018 09:05 PM   Modules accepted: Level of Service

## 2018-07-08 NOTE — Progress Notes (Signed)
Patient has been approved for drug assistance by [employer] for Vidaza(azacitidine). The enrollment period is from 06/24/18-07/04/19 based on self pay. First DOS covered is 06/24/18.

## 2018-07-09 LAB — FUNGITELL, SERUM: Fungitell Result: <31 pg/mL (ref <80)

## 2018-07-10 ENCOUNTER — Telehealth: Payer: Self-pay

## 2018-07-10 ENCOUNTER — Ambulatory Visit: Admission: RE | Admit: 2018-07-10 | Payer: Self-pay | Source: Ambulatory Visit

## 2018-07-10 NOTE — Telephone Encounter (Signed)
Contacted jack about financial assistance for patient, since she has no insurance and needs a CT orbit. Barnabas Lister provided me with a number to give to patient and apply for charity care if CT is done at [facility] health. I called and talked to patient's husband Jeneen Rinks to notify him. He also requested that patient's appts scheduled on 6/29 be rescheduled to 7/1 since he is adding patient to his insurance and that is when insurance will start taking effect.

## 2018-07-15 ENCOUNTER — Ambulatory Visit
Admission: RE | Admit: 2018-07-15 | Discharge: 2018-07-15 | Disposition: A | Discharge status: Home / Self Care | Payer: Managed Care, Other (non HMO) | Source: Ambulatory Visit | Attending: Oncology | Admitting: Oncology

## 2018-07-15 ENCOUNTER — Ambulatory Visit: Payer: Self-pay

## 2018-07-15 ENCOUNTER — Ambulatory Visit: Payer: Self-pay | Admitting: Oncology

## 2018-07-15 ENCOUNTER — Other Ambulatory Visit: Payer: Self-pay

## 2018-07-15 ENCOUNTER — Telehealth: Payer: Self-pay

## 2018-07-15 NOTE — Telephone Encounter (Signed)
Received secure chat from Ronnell Freshwater RN at same day surgery stating that patient had PICC line placement scheuduled today, but she talked to patient's husband and he states she is currently admitted at [facility] and feels like [facility] "will take care of her PICC line".  Appointment for PICC line placement today has been cancelled and if she should need another appointment, we can give her a call at Same Day Surgery at that time to reschedule.

## 2018-07-15 NOTE — OR Nursing (Signed)
Patient husband states she is currently admitted to [facility] and believes that [facility] "will take care of her PICC line".  Appointment cancelled for today. Told husband to call cancer center for further needs.

## 2018-07-17 ENCOUNTER — Inpatient Hospital Stay: Payer: Self-pay

## 2018-07-17 ENCOUNTER — Ambulatory Visit: Payer: Self-pay

## 2018-07-17 ENCOUNTER — Ambulatory Visit: Payer: Self-pay | Admitting: Oncology

## 2018-07-17 ENCOUNTER — Inpatient Hospital Stay: Payer: Self-pay | Admitting: Oncology

## 2018-07-17 ENCOUNTER — Other Ambulatory Visit: Payer: Self-pay

## 2018-09-17 DEATH — deceased
